# Patient Record
Sex: Female | Born: 1955 | Race: Black or African American | Hispanic: No | Smoking: Never smoker
Health system: Southern US, Community
[De-identification: ages and names within clinical notes are randomized; demographics above are authoritative.]

## PROBLEM LIST (undated history)

## (undated) DIAGNOSIS — I4819 Other persistent atrial fibrillation: Secondary | ICD-10-CM

## (undated) DIAGNOSIS — E119 Type 2 diabetes mellitus without complications: Secondary | ICD-10-CM

## (undated) DIAGNOSIS — J45909 Unspecified asthma, uncomplicated: Secondary | ICD-10-CM

## (undated) DIAGNOSIS — M1990 Unspecified osteoarthritis, unspecified site: Secondary | ICD-10-CM

## (undated) DIAGNOSIS — I639 Cerebral infarction, unspecified: Secondary | ICD-10-CM

## (undated) DIAGNOSIS — I10 Essential (primary) hypertension: Secondary | ICD-10-CM

## (undated) DIAGNOSIS — D649 Anemia, unspecified: Secondary | ICD-10-CM

## (undated) HISTORY — DX: Cerebral infarction, unspecified: I63.9

## (undated) HISTORY — DX: Essential (primary) hypertension: I10

## (undated) HISTORY — DX: Morbid (severe) obesity due to excess calories: E66.01

## (undated) HISTORY — PX: KNEE ARTHROSCOPY: SHX127

## (undated) HISTORY — DX: Anemia, unspecified: D64.9

## (undated) HISTORY — DX: Other persistent atrial fibrillation: I48.19

## (undated) HISTORY — DX: Type 2 diabetes mellitus without complications: E11.9

## (undated) HISTORY — DX: Unspecified asthma, uncomplicated: J45.909

## (undated) HISTORY — PX: TONSILLECTOMY: SUR1361

---

## 1989-10-13 HISTORY — PX: TUBAL LIGATION: SHX77

## 1998-07-03 ENCOUNTER — Encounter: Admission: RE | Admit: 1998-07-03 | Discharge: 1998-07-03 | Payer: Self-pay | Admitting: *Deleted

## 2001-01-27 ENCOUNTER — Encounter: Admission: RE | Admit: 2001-01-27 | Discharge: 2001-01-27 | Payer: Self-pay | Admitting: Family Medicine

## 2001-01-27 ENCOUNTER — Encounter: Payer: Self-pay | Admitting: Family Medicine

## 2001-07-31 ENCOUNTER — Emergency Department (HOSPITAL_COMMUNITY): Admission: EM | Admit: 2001-07-31 | Discharge: 2001-08-01 | Payer: Self-pay

## 2002-06-01 ENCOUNTER — Encounter: Payer: Self-pay | Admitting: Emergency Medicine

## 2002-06-01 ENCOUNTER — Emergency Department (HOSPITAL_COMMUNITY): Admission: EM | Admit: 2002-06-01 | Discharge: 2002-06-02 | Payer: Self-pay | Admitting: Emergency Medicine

## 2002-06-08 ENCOUNTER — Ambulatory Visit: Admission: RE | Admit: 2002-06-08 | Discharge: 2002-06-08 | Payer: Self-pay | Admitting: Internal Medicine

## 2003-02-23 ENCOUNTER — Emergency Department (HOSPITAL_COMMUNITY): Admission: EM | Admit: 2003-02-23 | Discharge: 2003-02-23 | Payer: Self-pay | Admitting: Emergency Medicine

## 2003-02-23 ENCOUNTER — Encounter: Payer: Self-pay | Admitting: Emergency Medicine

## 2003-04-04 ENCOUNTER — Emergency Department (HOSPITAL_COMMUNITY): Admission: EM | Admit: 2003-04-04 | Discharge: 2003-04-04 | Payer: Self-pay | Admitting: Emergency Medicine

## 2004-02-28 ENCOUNTER — Ambulatory Visit (HOSPITAL_COMMUNITY): Admission: RE | Admit: 2004-02-28 | Discharge: 2004-02-28 | Payer: Self-pay | Admitting: Otolaryngology

## 2004-02-28 ENCOUNTER — Ambulatory Visit (HOSPITAL_BASED_OUTPATIENT_CLINIC_OR_DEPARTMENT_OTHER): Admission: RE | Admit: 2004-02-28 | Discharge: 2004-02-28 | Payer: Self-pay | Admitting: Otolaryngology

## 2015-12-12 ENCOUNTER — Ambulatory Visit: Payer: BLUE CROSS/BLUE SHIELD | Admitting: Internal Medicine

## 2016-10-13 DIAGNOSIS — I639 Cerebral infarction, unspecified: Secondary | ICD-10-CM

## 2016-10-13 HISTORY — DX: Cerebral infarction, unspecified: I63.9

## 2017-01-06 ENCOUNTER — Encounter (HOSPITAL_COMMUNITY): Payer: Self-pay | Admitting: Emergency Medicine

## 2017-01-06 ENCOUNTER — Emergency Department (HOSPITAL_COMMUNITY): Payer: BLUE CROSS/BLUE SHIELD

## 2017-01-06 ENCOUNTER — Other Ambulatory Visit: Payer: Self-pay

## 2017-01-06 ENCOUNTER — Emergency Department (HOSPITAL_COMMUNITY)
Admission: EM | Admit: 2017-01-06 | Discharge: 2017-01-07 | Disposition: A | Discharge status: Home / Self Care | Payer: BLUE CROSS/BLUE SHIELD | Attending: Emergency Medicine | Admitting: Emergency Medicine

## 2017-01-06 DIAGNOSIS — R55 Syncope and collapse: Secondary | ICD-10-CM | POA: Diagnosis present

## 2017-01-06 DIAGNOSIS — J111 Influenza due to unidentified influenza virus with other respiratory manifestations: Secondary | ICD-10-CM | POA: Insufficient documentation

## 2017-01-06 DIAGNOSIS — Z79899 Other long term (current) drug therapy: Secondary | ICD-10-CM | POA: Insufficient documentation

## 2017-01-06 HISTORY — DX: Unspecified osteoarthritis, unspecified site: M19.90

## 2017-01-06 LAB — BASIC METABOLIC PANEL
Anion gap: 11 (ref 5–15)
BUN: 13 mg/dL (ref 6–20)
CALCIUM: 8.6 mg/dL — AB (ref 8.9–10.3)
CO2: 19 mmol/L — AB (ref 22–32)
Chloride: 104 mmol/L (ref 101–111)
Creatinine, Ser: 1.05 mg/dL — ABNORMAL HIGH (ref 0.44–1.00)
GFR calc Af Amer: >60 mL/min (ref >60)
GFR, EST NON AFRICAN AMERICAN: 57 mL/min — AB (ref >60)
GLUCOSE: 109 mg/dL — AB (ref 65–99)
POTASSIUM: 4.7 mmol/L (ref 3.5–5.1)
Sodium: 134 mmol/L — ABNORMAL LOW (ref 135–145)

## 2017-01-06 LAB — URINALYSIS, ROUTINE W REFLEX MICROSCOPIC
BILIRUBIN URINE: NEGATIVE
Glucose, UA: NEGATIVE mg/dL
HGB URINE DIPSTICK: NEGATIVE
KETONES UR: NEGATIVE mg/dL
Leukocytes, UA: NEGATIVE
Nitrite: NEGATIVE
PROTEIN: NEGATIVE mg/dL
Specific Gravity, Urine: 1.017 (ref 1.005–1.030)
pH: 5 (ref 5.0–8.0)

## 2017-01-06 LAB — CBC
HEMATOCRIT: 36 % (ref 36.0–46.0)
Hemoglobin: 11.2 g/dL — ABNORMAL LOW (ref 12.0–15.0)
MCH: 26.9 pg (ref 26.0–34.0)
MCHC: 31.1 g/dL (ref 30.0–36.0)
MCV: 86.3 fL (ref 78.0–100.0)
PLATELETS: 241 10*3/uL (ref 150–400)
RBC: 4.17 MIL/uL (ref 3.87–5.11)
RDW: 14.6 % (ref 11.5–15.5)
WBC: 6.4 10*3/uL (ref 4.0–10.5)

## 2017-01-06 LAB — CBG MONITORING, ED: Glucose-Capillary: 94 mg/dL (ref 65–99)

## 2017-01-06 NOTE — ED Provider Notes (Signed)
[facility] DEPT Provider Note   CSN: [id number] Arrival date & time: 01/06/17  6734     History   Chief Complaint Chief Complaint  Patient presents with  . Loss of Consciousness    HPI Stacy Chapman is a 61 y.o. female.  She presents for evaluation of syncope.  This occurred as she got up from the commode, to wash her hands.  Fell injuring her right hand.  Family members heard her fall and came to her assistance.  She has been ill for 3 days with fever, cough, and malaise.  She denies headache, neck pain, back pain, difficulty walking or weakness at this time.  Her daughter is sick with identical respiratory illness.  Patient denies dysuria, diarrhea, nausea or vomiting.  There are no other known modifying factors.  HPI  Past Medical History:  Diagnosis Date  . Arthritis     There are no active problems to display for this patient.   Past Surgical History:  Procedure Laterality Date  . KNEE ARTHROSCOPY      OB History    No data available       Home Medications    Prior to Admission medications   Medication Sig Start Date End Date Taking? Authorizing Provider  cetirizine (ZYRTEC) 10 MG tablet Take 10 mg by mouth daily as needed for allergies.   Yes Historical Provider, MD    Family History No family history on file.  Social History Social History  Substance Use Topics  . Smoking status: Never Smoker  . Smokeless tobacco: Never Used  . Alcohol use 3.0 oz/week    5 Standard drinks or equivalent per week     Comment: Drinks multiple times a week     Allergies   Patient has no known allergies.   Review of Systems Review of Systems  All other systems reviewed and are negative.    Physical Exam Updated Vital Signs BP 133/73 (BP Location: Left Arm)   Pulse 90   Temp (!) 100.7 F (38.2 C) (Oral)   Resp (!) 22   Ht 5\' 8"  (1.727 m)   Wt 230 lb (104.3 kg)   SpO2 100%   BMI 34.97 kg/m   Physical Exam  Constitutional: She is oriented  to person, place, and time. She appears well-developed. No distress.  Obese  HENT:  Head: Normocephalic and atraumatic.  Eyes: Conjunctivae and EOM are normal. Pupils are equal, round, and reactive to light.  Neck: Normal range of motion and phonation normal. Neck supple.  Cardiovascular: Normal rate and regular rhythm.   Pulmonary/Chest: Effort normal and breath sounds normal. No respiratory distress. She has no wheezes. She exhibits no tenderness.  Abdominal: Soft. She exhibits no distension. There is no tenderness. There is no guarding.  Musculoskeletal: Normal range of motion.  Neurological: She is alert and oriented to person, place, and time. She exhibits normal muscle tone.  Skin: Skin is warm and dry.  Psychiatric: She has a normal mood and affect. Her behavior is normal. Judgment and thought content normal.  Nursing note and vitals reviewed.    ED Treatments / Results  Labs (all labs ordered are listed, but only abnormal results are displayed) Labs Reviewed  BASIC METABOLIC PANEL - Abnormal; Notable for the following:       Result Value   Sodium 134 (*)    CO2 19 (*)    Glucose, Bld 109 (*)    Creatinine, Ser 1.05 (*)    Calcium 8.6 (*)  GFR calc non Af Amer 57 (*)    All other components within normal limits  CBC - Abnormal; Notable for the following:    Hemoglobin 11.2 (*)    All other components within normal limits  URINALYSIS, ROUTINE W REFLEX MICROSCOPIC - Abnormal; Notable for the following:    APPearance HAZY (*)    All other components within normal limits  CBG MONITORING, ED    EKG  EKG Interpretation  Date/Time:  Tuesday January 06 2017 18:29:16 EDT Ventricular Rate:  83 PR Interval:  130 QRS Duration: 70 QT Interval:  360 QTC Calculation: 423 R Axis:   16 Text Interpretation:  Normal sinus rhythm Nonspecific ST and T wave abnormality Abnormal ECG since last tracing no significant change Confirmed by Eulis Foster  MD, Louana Fontenot [phone]) on 01/06/2017 10:49:55  PM       Radiology Dg Chest 2 View  Result Date: 01/06/2017 CLINICAL DATA:  Productive cough and fever x3 days EXAM: CHEST  2 VIEW COMPARISON:  Chest CT report from 06/01/2002 and CXR from 06/08/2002 FINDINGS: The heart size and mediastinal contours are within normal limits. No pulmonary consolidation, CHF or effusion. Left upper lobe 8 mm mm density projects over the left posterior fifth rib possibly a bone island or pulmonary nodule. Small anterior basilar nodular densities also seen on the lateral view measure approximately 8 mm as well. Patient reportedly had pulmonary nodules on the prior chest CT 2003. Unfortunately these are not available for direct comparison. A non emergent repeat chest CT may help to establish new baseline or stability if prior chest CT images are made available. IMPRESSION: No active cardiopulmonary disease. Left upper and anterior basilar nodular densities measuring up to 8 mm. Consider repeat nonemergent chest CT as clinically warranted. Patient had nodular densities noted on a prior chest CT from 2003 but these are not available for direct comparison. Electronically Signed   By: Ashley Royalty M.D.   On: 01/06/2017 21:01    Procedures Procedures (including critical care time)  Medications Ordered in ED Medications - No data to display   Initial Impression / Assessment and Plan / ED Course  I have reviewed the triage vital signs and the nursing notes.  Pertinent labs & imaging results that were available during my care of the patient were reviewed by me and considered in my medical decision making (see chart for details).     Medications - No data to display  Patient Vitals for the past 24 hrs:  BP Temp Temp src Pulse Resp SpO2 Height Weight  01/06/17 2349 133/73 (!) 100.7 F (38.2 C) Oral 90 (!) 22 100 % - -  01/06/17 2215 140/78 - - 88 - 99 % - -  01/06/17 2200 (!) 153/65 - - (!) 49 - 99 % - -  01/06/17 2030 128/69 - - 88 (!) 23 100 % - -  01/06/17 2015  140/77 - - 85 (!) 21 99 % - -  01/06/17 2000 - (!) 101.1 F (38.4 C) Oral - - - - -  01/06/17 1950 140/79 - - 81 - 99 % - -  01/06/17 1931 - - - - - - 5\' 8"  (1.727 m) 230 lb (104.3 kg)  01/06/17 1930 128/65 99.8 F (37.7 C) Oral 81 18 98 % - -    12:04 AM Reevaluation with update and discussion. After initial assessment and treatment, an updated evaluation reveals at this time she is feeling better and has no further complaints.  Findings discussed  with patient and friend with her, all questions answered. Jacion Dismore L    Final Clinical Impressions(s) / ED Diagnoses   Final diagnoses:  Influenza  Syncope, unspecified syncope type   Syncope, with febrile illness.  Doubt serious injury.  Likely influenza without evidence for pneumonia, metabolic instability or impending vascular collapse.  Nursing Notes Reviewed/ Care Coordinated Applicable Imaging Reviewed Interpretation of Laboratory Data incorporated into ED treatment  The patient appears reasonably screened and/or stabilized for discharge and I doubt any other medical condition or other [facility] requiring further screening, evaluation, or treatment in the ED at this time prior to discharge.  Plan: Home Medications-OTC analgesia, antipyretic.; Home Treatments-rest fluids; return here if the recommended treatment, does not improve the symptoms; Recommended follow up-PCP as needed    New Prescriptions New Prescriptions   No medications on file     Daleen Bo, MD 01/07/17 0006

## 2017-01-06 NOTE — ED Notes (Signed)
On way to XR 

## 2017-01-06 NOTE — ED Triage Notes (Signed)
Pt arrives to the ED via EMS with reports of syncopal episode. Pt reports fever and diarrhea X3 days. Pt was using the bathroom and passed out. Pt did hit head on floor or toilette. Pt denies taking blood thinners. Per EMS pt had orthostatic changes. Pt given 250cc NS en route.

## 2017-01-06 NOTE — ED Notes (Signed)
ED EKG done by [facility], EMT and given to Dr. Eulis Foster.  Patient was in hallway when done.

## 2017-01-06 NOTE — ED Notes (Signed)
This RN attempted unsuccessfully to draw labs. Primary RN made aware.

## 2017-01-06 NOTE — ED Notes (Signed)
Santiago Glad from lab is going to attempt blood draw.  2 RNs with unsuccessful attempts.

## 2017-01-06 NOTE — ED Notes (Signed)
Pt states that she has been having fever and chills since Sunday.  States she has had no pain. Diarrhea started today and states that she has gone at least 10 times.  Denies any N/V.  Patient states that she has had a cough for months.  Patient states she was washing hands in bathroom and got really hot. Pt states the heat made her pass out.  Patient hit head either on wall or bathtub.  LOC no more than thirty seconds.  Patient has knot on left side of forehead.

## 2017-01-07 NOTE — Discharge Instructions (Signed)
Get plenty of rest and drink a lot of fluids.  There were no serious problems found, after your episode of fainting.  For fever or pain, take Tylenol or Motrin.  For cough, use Robitussin-DM.  Follow-up with the doctor of your choice as needed, for problems.

## 2017-01-07 NOTE — ED Notes (Signed)
Pt stable, understands discharge instructions, and reasons for return.   

## 2017-01-14 ENCOUNTER — Ambulatory Visit: Payer: BLUE CROSS/BLUE SHIELD | Admitting: Nurse Practitioner

## 2017-01-15 ENCOUNTER — Ambulatory Visit (INDEPENDENT_AMBULATORY_CARE_PROVIDER_SITE_OTHER): Payer: BLUE CROSS/BLUE SHIELD | Admitting: Nurse Practitioner

## 2017-01-15 ENCOUNTER — Encounter: Payer: Self-pay | Admitting: Nurse Practitioner

## 2017-01-15 ENCOUNTER — Other Ambulatory Visit (INDEPENDENT_AMBULATORY_CARE_PROVIDER_SITE_OTHER): Payer: BLUE CROSS/BLUE SHIELD

## 2017-01-15 ENCOUNTER — Ambulatory Visit (INDEPENDENT_AMBULATORY_CARE_PROVIDER_SITE_OTHER)
Admission: RE | Admit: 2017-01-15 | Discharge: 2017-01-15 | Disposition: A | Discharge status: Home / Self Care | Payer: BLUE CROSS/BLUE SHIELD | Source: Ambulatory Visit | Attending: Nurse Practitioner | Admitting: Nurse Practitioner

## 2017-01-15 VITALS — BP 150/80 | HR 84 | Temp 98.3°F | Ht 68.0 in | Wt 236.0 lb

## 2017-01-15 DIAGNOSIS — S0990XA Unspecified injury of head, initial encounter: Secondary | ICD-10-CM

## 2017-01-15 DIAGNOSIS — J9801 Acute bronchospasm: Secondary | ICD-10-CM | POA: Diagnosis not present

## 2017-01-15 DIAGNOSIS — R0602 Shortness of breath: Secondary | ICD-10-CM | POA: Diagnosis not present

## 2017-01-15 DIAGNOSIS — J209 Acute bronchitis, unspecified: Secondary | ICD-10-CM | POA: Diagnosis not present

## 2017-01-15 DIAGNOSIS — R42 Dizziness and giddiness: Secondary | ICD-10-CM

## 2017-01-15 DIAGNOSIS — M542 Cervicalgia: Secondary | ICD-10-CM | POA: Diagnosis not present

## 2017-01-15 DIAGNOSIS — W19XXXA Unspecified fall, initial encounter: Secondary | ICD-10-CM | POA: Diagnosis not present

## 2017-01-15 DIAGNOSIS — S161XXA Strain of muscle, fascia and tendon at neck level, initial encounter: Secondary | ICD-10-CM

## 2017-01-15 DIAGNOSIS — E860 Dehydration: Secondary | ICD-10-CM

## 2017-01-15 LAB — CBC WITH DIFFERENTIAL/PLATELET
BASOS ABS: 0 10*3/uL (ref 0.0–0.1)
BASOS PCT: 0.3 % (ref 0.0–3.0)
EOS ABS: 0.4 10*3/uL (ref 0.0–0.7)
Eosinophils Relative: 6.5 % — ABNORMAL HIGH (ref 0.0–5.0)
HCT: 33.5 % — ABNORMAL LOW (ref 36.0–46.0)
HEMOGLOBIN: 10.8 g/dL — AB (ref 12.0–15.0)
LYMPHS PCT: 26.1 % (ref 12.0–46.0)
Lymphs Abs: 1.7 10*3/uL (ref 0.7–4.0)
MCHC: 32.3 g/dL (ref 30.0–36.0)
MCV: 84.9 fl (ref 78.0–100.0)
MONO ABS: 0.7 10*3/uL (ref 0.1–1.0)
Monocytes Relative: 10.9 % (ref 3.0–12.0)
Neutro Abs: 3.6 10*3/uL (ref 1.4–7.7)
Neutrophils Relative %: 56.2 % (ref 43.0–77.0)
Platelets: 324 10*3/uL (ref 150.0–400.0)
RBC: 3.95 Mil/uL (ref 3.87–5.11)
RDW: 14.5 % (ref 11.5–15.5)
WBC: 6.5 10*3/uL (ref 4.0–10.5)

## 2017-01-15 LAB — COMPREHENSIVE METABOLIC PANEL
ALBUMIN: 3.6 g/dL (ref 3.5–5.2)
ALT: 37 U/L — AB (ref 0–35)
AST: 44 U/L — AB (ref 0–37)
Alkaline Phosphatase: 56 U/L (ref 39–117)
BILIRUBIN TOTAL: 0.4 mg/dL (ref 0.2–1.2)
BUN: 16 mg/dL (ref 6–23)
CALCIUM: 8.7 mg/dL (ref 8.4–10.5)
CHLORIDE: 107 meq/L (ref 96–112)
CO2: 27 mEq/L (ref 19–32)
CREATININE: 0.85 mg/dL (ref 0.40–1.20)
GFR: 87.64 mL/min (ref >60.00)
Glucose, Bld: 99 mg/dL (ref 70–99)
Potassium: 4.2 mEq/L (ref 3.5–5.1)
SODIUM: 139 meq/L (ref 135–145)
Total Protein: 7.7 g/dL (ref 6.0–8.3)

## 2017-01-15 MED ORDER — ACETAMINOPHEN 500 MG PO TABS: 500.0000 mg | ORAL_TABLET | Freq: Four times a day (QID) | ORAL | 0 refills | Status: DC | PRN

## 2017-01-15 MED ORDER — ALBUTEROL SULFATE HFA 108 (90 BASE) MCG/ACT IN AERS: 2.0000 | INHALATION_SPRAY | Freq: Four times a day (QID) | RESPIRATORY_TRACT | 2 refills | Status: DC | PRN

## 2017-01-15 MED ORDER — BENZONATATE 100 MG PO CAPS: 100.0000 mg | ORAL_CAPSULE | Freq: Three times a day (TID) | ORAL | 0 refills | Status: DC | PRN

## 2017-01-15 MED ORDER — GUAIFENESIN ER 600 MG PO TB12: 600.0000 mg | ORAL_TABLET | Freq: Two times a day (BID) | ORAL | 0 refills | Status: DC | PRN

## 2017-01-15 NOTE — Progress Notes (Signed)
Pre visit review using our clinic review tool, if applicable. No additional management support is needed unless otherwise documented below in the visit note. 

## 2017-01-15 NOTE — Patient Instructions (Signed)
Go to basement for blood draw and CXR.  You will be called with results.   Dehydration, Adult Dehydration is a condition in which there is not enough fluid or water in the body. This happens when you lose more fluids than you take in. Important organs, such as the kidneys, brain, and heart, cannot function without a proper amount of fluids. Any loss of fluids from the body can lead to dehydration. Dehydration can range from mild to severe. This condition should be treated right away to prevent it from becoming severe. What are the causes? This condition may be caused by:  Vomiting.  Diarrhea.  Excessive sweating, such as from heat exposure or exercise.  Not drinking enough fluid, especially:  When ill.  While doing activity that requires a lot of energy.  Excessive urination.  Fever.  Infection.  Certain medicines, such as medicines that cause the body to lose excess fluid (diuretics).  Inability to access safe drinking water.  Reduced physical ability to get adequate water and food. What increases the risk? This condition is more likely to develop in people:  Who have a poorly controlled long-term (chronic) illness, such as diabetes, heart disease, or kidney disease.  Who are age 29 or older.  Who are disabled.  Who live in a place with high altitude.  Who play endurance sports. What are the signs or symptoms? Symptoms of mild dehydration may include:   Thirst.  Dry lips.  Slightly dry mouth.  Dry, warm skin.  Dizziness. Symptoms of moderate dehydration may include:   Very dry mouth.  Muscle cramps.  Dark urine. Urine may be the color of tea.  Decreased urine production.  Decreased tear production.  Heartbeat that is irregular or faster than normal (palpitations).  Headache.  Light-headedness, especially when you stand up from a sitting position.  Fainting (syncope). Symptoms of severe dehydration may include:   Changes in skin, such  as:  Cold and clammy skin.  Blotchy (mottled) or pale skin.  Skin that does not quickly return to normal after being lightly pinched and released (poor skin turgor).  Changes in body fluids, such as:  Extreme thirst.  No tear production.  Inability to sweat when body temperature is high, such as in hot weather.  Very little urine production.  Changes in vital signs, such as:  Weak pulse.  Pulse that is more than 100 beats a minute when sitting still.  Rapid breathing.  Low blood pressure.  Other changes, such as:  Sunken eyes.  Cold hands and feet.  Confusion.  Lack of energy (lethargy).  Difficulty waking up from sleep.  Short-term weight loss.  Unconsciousness. How is this diagnosed? This condition is diagnosed based on your symptoms and a physical exam. Blood and urine tests may be done to help confirm the diagnosis. How is this treated? Treatment for this condition depends on the severity. Mild or moderate dehydration can often be treated at home. Treatment should be started right away. Do not wait until dehydration becomes severe. Severe dehydration is an emergency and it needs to be treated in a hospital. Treatment for mild dehydration may include:   Drinking more fluids.  Replacing salts and minerals in your blood (electrolytes) that you may have lost. Treatment for moderate dehydration may include:   Drinking an oral rehydration solution (ORS). This is a drink that helps you replace fluids and electrolytes (rehydrate). It can be found at pharmacies and retail stores. Treatment for severe dehydration may include:  Receiving fluids through an IV tube.  Receiving an electrolyte solution through a feeding tube that is passed through your nose and into your stomach (nasogastric tube, or NG tube).  Correcting any abnormalities in electrolytes.  Treating the underlying cause of dehydration. Follow these instructions at home:  If directed by your  health care provider, drink an ORS:  Make an ORS by following instructions on the package.  Start by drinking small amounts, about  cup (120 mL) every 5-10 minutes.  Slowly increase how much you drink until you have taken the amount recommended by your health care provider.  Drink enough clear fluid to keep your urine clear or pale yellow. If you were told to drink an ORS, finish the ORS first, then start slowly drinking other clear fluids. Drink fluids such as:  Water. Do not drink only water. Doing that can lead to having too little salt (sodium) in the body (hyponatremia).  Ice chips.  Fruit juice that you have added water to (diluted fruit juice).  Low-calorie sports drinks.  Avoid:  Alcohol.  Drinks that contain a lot of sugar. These include high-calorie sports drinks, fruit juice that is not diluted, and soda.  Caffeine.  Foods that are greasy or contain a lot of fat or sugar.  Take over-the-counter and prescription medicines only as told by your health care provider.  Do not take sodium tablets. This can lead to having too much sodium in the body (hypernatremia).  Eat foods that contain a healthy balance of electrolytes, such as bananas, oranges, potatoes, tomatoes, and spinach.  Keep all follow-up visits as told by your health care provider. This is important. Contact a health care provider if:  You have abdominal pain that:  Gets worse.  Stays in one area (localizes).  You have a rash.  You have a stiff neck.  You are more irritable than usual.  You are sleepier or more difficult to wake up than usual.  You feel weak or dizzy.  You feel very thirsty.  You have urinated only a small amount of very dark urine over 6-8 hours. Get help right away if:  You have symptoms of severe dehydration.  You cannot drink fluids without vomiting.  Your symptoms get worse with treatment.  You have a fever.  You have a severe headache.  You have vomiting or  diarrhea that:  Gets worse.  Does not go away.  You have blood or green matter (bile) in your vomit.  You have blood in your stool. This may cause stool to look black and tarry.  You have not urinated in 6-8 hours.  You faint.  Your heart rate while sitting still is over 100 beats a minute.  You have trouble breathing. This information is not intended to replace advice given to you by your health care provider. Make sure you discuss any questions you have with your health care provider. Document Released: 09/29/2005 Document Revised: 04/25/2016 Document Reviewed: 11/23/2015 Elsevier Interactive Patient Education  2017 [employer].

## 2017-01-15 NOTE — Progress Notes (Signed)
Subjective:  Patient ID: Stacy Chapman, female    DOB: [birth year]  Age: 61 y.o. MRN: [id number]  CC: Establish Care (est care/pt fell last---went to ER--body pain,dizzy,)   Headache   This is a new problem. The current episode started 1 to 4 weeks ago. The problem occurs intermittently. The problem has been waxing and waning. The pain is located in the bilateral and frontal region. The pain does not radiate. The pain quality is not similar to prior headaches. The quality of the pain is described as aching and throbbing. The pain is moderate. Associated symptoms include back pain, coughing, dizziness and neck pain. Pertinent negatives include no abnormal behavior, anorexia, blurred vision, ear pain, fever, numbness, rhinorrhea, sinus pressure, sore throat, visual change or vomiting.  Shortness of Breath  This is a new problem. The current episode started 1 to 4 weeks ago. The problem occurs constantly. The problem has been gradually worsening. Associated symptoms include headaches, neck pain, sputum production, syncope and wheezing. Pertinent negatives include no chest pain, ear pain, fever, hemoptysis, leg pain, leg swelling, orthopnea, PND, rash, rhinorrhea, sore throat or vomiting. The symptoms are aggravated by any activity and URIs.    Outpatient Medications Prior to Visit  Medication Sig Dispense Refill  . cetirizine (ZYRTEC) 10 MG tablet Take 10 mg by mouth daily as needed for allergies.     No facility-administered medications prior to visit.     ROS See HPI  Objective:  BP (!) 150/80   Pulse 84   Temp 98.3 F (36.8 C)   Ht 5\' 8"  (1.727 m)   Wt 236 lb (107 kg)   SpO2 99%   BMI 35.88 kg/m   BP Readings from Last 3 Encounters:  01/15/17 (!) 150/80  01/06/17 133/73    Wt Readings from Last 3 Encounters:  01/15/17 236 lb (107 kg)  01/06/17 230 lb (104.3 kg)    Physical Exam  Constitutional: She is oriented to person, place, and time.  HENT:  Right Ear:  External ear normal.  Left Ear: External ear normal.  Nose: Nose normal.  Mouth/Throat: Oropharynx is clear and moist. No oropharyngeal exudate.  Eyes: Conjunctivae and EOM are normal. Pupils are equal, round, and reactive to light.  Neck: Normal range of motion. Neck supple. No thyromegaly present.  Cardiovascular: Normal rate, regular rhythm and normal heart sounds.   Pulmonary/Chest: Effort normal. She has wheezes. She has no rales. She exhibits no tenderness.  Abdominal: Soft. Bowel sounds are normal.  Musculoskeletal: She exhibits no edema.  Lymphadenopathy:    She has no cervical adenopathy.  Neurological: She is alert and oriented to person, place, and time.  Skin: Skin is warm and dry.  Vitals reviewed.   Lab Results  Component Value Date   WBC 6.5 01/15/2017   HGB 10.8 (L) 01/15/2017   HCT 33.5 (L) 01/15/2017   PLT 324.0 01/15/2017   GLUCOSE 99 01/15/2017   ALT 37 (H) 01/15/2017   AST 44 (H) 01/15/2017   NA 139 01/15/2017   K 4.2 01/15/2017   CL 107 01/15/2017   CREATININE 0.85 01/15/2017   BUN 16 01/15/2017   CO2 27 01/15/2017    Dg Chest 2 View  Result Date: 01/06/2017 CLINICAL DATA:  Productive cough and fever x3 days EXAM: CHEST  2 VIEW COMPARISON:  Chest CT report from 06/01/2002 and CXR from 06/08/2002 FINDINGS: The heart size and mediastinal contours are within normal limits. No pulmonary consolidation, CHF or effusion. Left upper lobe  8 mm mm density projects over the left posterior fifth rib possibly a bone island or pulmonary nodule. Small anterior basilar nodular densities also seen on the lateral view measure approximately 8 mm as well. Patient reportedly had pulmonary nodules on the prior chest CT 2003. Unfortunately these are not available for direct comparison. A non emergent repeat chest CT may help to establish new baseline or stability if prior chest CT images are made available. IMPRESSION: No active cardiopulmonary disease. Left upper and anterior  basilar nodular densities measuring up to 8 mm. Consider repeat nonemergent chest CT as clinically warranted. Patient had nodular densities noted on a prior chest CT from 2003 but these are not available for direct comparison. Electronically Signed   By: Ashley Royalty M.D.   On: 01/06/2017 21:01    Assessment & Plan:   Stacy Chapman was seen today for establish care.  Diagnoses and all orders for this visit:  Injury of head, initial encounter -     CT Head Wo Contrast; Future -     DG Cervical Spine Complete; Future -     acetaminophen (TYLENOL) 500 MG tablet; Take 1 tablet (500 mg total) by mouth every 6 (six) hours as needed.  Fall, initial encounter -     CT Head Wo Contrast; Future -     acetaminophen (TYLENOL) 500 MG tablet; Take 1 tablet (500 mg total) by mouth every 6 (six) hours as needed.  Dehydration -     Comprehensive metabolic panel; Future -     CBC w/Diff; Future  SOB (shortness of breath) on exertion -     DG Chest 2 View; Future -     Comprehensive metabolic panel; Future -     CBC w/Diff; Future -     albuterol (PROVENTIL HFA;VENTOLIN HFA) 108 (90 Base) MCG/ACT inhaler; Inhale 2 puffs into the lungs every 6 (six) hours as needed for wheezing or shortness of breath. -     guaiFENesin (MUCINEX) 600 MG 12 hr tablet; Take 1 tablet (600 mg total) by mouth 2 (two) times daily as needed for cough or to loosen phlegm. -     benzonatate (TESSALON) 100 MG capsule; Take 1 capsule (100 mg total) by mouth 3 (three) times daily as needed for cough. -     methylPREDNISolone (MEDROL DOSEPAK) 4 MG TBPK tablet; Take as directed on package  Acute bronchitis, unspecified organism -     DG Chest 2 View; Future -     Comprehensive metabolic panel; Future -     CBC w/Diff; Future -     albuterol (PROVENTIL HFA;VENTOLIN HFA) 108 (90 Base) MCG/ACT inhaler; Inhale 2 puffs into the lungs every 6 (six) hours as needed for wheezing or shortness of breath. -     guaiFENesin (MUCINEX) 600 MG 12 hr  tablet; Take 1 tablet (600 mg total) by mouth 2 (two) times daily as needed for cough or to loosen phlegm. -     benzonatate (TESSALON) 100 MG capsule; Take 1 capsule (100 mg total) by mouth 3 (three) times daily as needed for cough. -     methylPREDNISolone (MEDROL DOSEPAK) 4 MG TBPK tablet; Take as directed on package  Dizziness -     Comprehensive metabolic panel; Future -     CBC w/Diff; Future -     DG Cervical Spine Complete; Future  Cervical muscle strain, initial encounter -     DG Cervical Spine Complete; Future -     acetaminophen (TYLENOL) 500  MG tablet; Take 1 tablet (500 mg total) by mouth every 6 (six) hours as needed. -     tiZANidine (ZANAFLEX) 4 MG capsule; Take 1 capsule (4 mg total) by mouth 3 (three) times daily as needed for muscle spasms.   I have discontinued Ms. Laski's cetirizine, ibuprofen, and Pseudoeph-Bromphen-DM (ROBITUSSIN ALLERGY/COUGH PO). I am also having her start on albuterol, guaiFENesin, acetaminophen, benzonatate, methylPREDNISolone, and tiZANidine.  Meds ordered this encounter  Medications  . DISCONTD: ibuprofen (ADVIL,MOTRIN) 100 MG/5ML suspension    Sig: Take 200 mg by mouth every 4 (four) hours as needed.  Marland Kitchen DISCONTD: Pseudoeph-Bromphen-DM (ROBITUSSIN ALLERGY/COUGH PO)    Sig: Take by mouth.  Marland Kitchen albuterol (PROVENTIL HFA;VENTOLIN HFA) 108 (90 Base) MCG/ACT inhaler    Sig: Inhale 2 puffs into the lungs every 6 (six) hours as needed for wheezing or shortness of breath.    Dispense:  1 Inhaler    Refill:  2    Order Specific Question:   Supervising Provider    Answer:   Cassandria Anger [[id number]]  . guaiFENesin (MUCINEX) 600 MG 12 hr tablet    Sig: Take 1 tablet (600 mg total) by mouth 2 (two) times daily as needed for cough or to loosen phlegm.    Dispense:  14 tablet    Refill:  0    Order Specific Question:   Supervising Provider    Answer:   Cassandria Anger [[id number]]  . acetaminophen (TYLENOL) 500 MG tablet    Sig: Take 1 tablet (500  mg total) by mouth every 6 (six) hours as needed.    Dispense:  30 tablet    Refill:  0    Order Specific Question:   Supervising Provider    Answer:   Cassandria Anger [[id number]]  . benzonatate (TESSALON) 100 MG capsule    Sig: Take 1 capsule (100 mg total) by mouth 3 (three) times daily as needed for cough.    Dispense:  20 capsule    Refill:  0    Order Specific Question:   Supervising Provider    Answer:   Cassandria Anger [[id number]]  . methylPREDNISolone (MEDROL DOSEPAK) 4 MG TBPK tablet    Sig: Take as directed on package    Dispense:  21 tablet    Refill:  0    Order Specific Question:   Supervising Provider    Answer:   Cassandria Anger [[id number]]  . tiZANidine (ZANAFLEX) 4 MG capsule    Sig: Take 1 capsule (4 mg total) by mouth 3 (three) times daily as needed for muscle spasms.    Dispense:  20 capsule    Refill:  0    Order Specific Question:   Supervising Provider    Answer:   Cassandria Anger [[id number]]    Follow-up: Return in about 1 week (around 01/22/2017) for generalized pain, SOB.  Wilfred Lacy, NP

## 2017-01-16 MED ORDER — TIZANIDINE HCL 4 MG PO CAPS: 4.0000 mg | ORAL_CAPSULE | Freq: Three times a day (TID) | ORAL | 0 refills | Status: DC | PRN

## 2017-01-16 MED ORDER — METHYLPREDNISOLONE 4 MG PO TBPK: ORAL_TABLET | ORAL | 0 refills | Status: DC

## 2017-01-20 ENCOUNTER — Ambulatory Visit
Admission: RE | Admit: 2017-01-20 | Discharge: 2017-01-20 | Disposition: A | Discharge status: Home / Self Care | Payer: BLUE CROSS/BLUE SHIELD | Source: Ambulatory Visit | Attending: Nurse Practitioner | Admitting: Nurse Practitioner

## 2017-01-20 DIAGNOSIS — S0990XA Unspecified injury of head, initial encounter: Secondary | ICD-10-CM

## 2017-01-20 DIAGNOSIS — W19XXXA Unspecified fall, initial encounter: Secondary | ICD-10-CM

## 2017-01-22 ENCOUNTER — Ambulatory Visit (INDEPENDENT_AMBULATORY_CARE_PROVIDER_SITE_OTHER): Payer: BLUE CROSS/BLUE SHIELD | Admitting: Nurse Practitioner

## 2017-01-22 ENCOUNTER — Encounter: Payer: Self-pay | Admitting: Nurse Practitioner

## 2017-01-22 VITALS — BP 146/74 | HR 91 | Temp 98.1°F | Ht 68.0 in | Wt 240.0 lb

## 2017-01-22 DIAGNOSIS — J209 Acute bronchitis, unspecified: Secondary | ICD-10-CM

## 2017-01-22 DIAGNOSIS — R748 Abnormal levels of other serum enzymes: Secondary | ICD-10-CM | POA: Insufficient documentation

## 2017-01-22 DIAGNOSIS — S060X0A Concussion without loss of consciousness, initial encounter: Secondary | ICD-10-CM

## 2017-01-22 DIAGNOSIS — R739 Hyperglycemia, unspecified: Secondary | ICD-10-CM

## 2017-01-22 DIAGNOSIS — I4819 Other persistent atrial fibrillation: Secondary | ICD-10-CM | POA: Insufficient documentation

## 2017-01-22 DIAGNOSIS — R030 Elevated blood-pressure reading, without diagnosis of hypertension: Secondary | ICD-10-CM

## 2017-01-22 DIAGNOSIS — E119 Type 2 diabetes mellitus without complications: Secondary | ICD-10-CM | POA: Insufficient documentation

## 2017-01-22 DIAGNOSIS — Z13220 Encounter for screening for lipoid disorders: Secondary | ICD-10-CM

## 2017-01-22 DIAGNOSIS — I4891 Unspecified atrial fibrillation: Secondary | ICD-10-CM | POA: Diagnosis present

## 2017-01-22 DIAGNOSIS — J324 Chronic pansinusitis: Secondary | ICD-10-CM | POA: Insufficient documentation

## 2017-01-22 DIAGNOSIS — E1169 Type 2 diabetes mellitus with other specified complication: Secondary | ICD-10-CM | POA: Insufficient documentation

## 2017-01-22 DIAGNOSIS — J0140 Acute pansinusitis, unspecified: Secondary | ICD-10-CM

## 2017-01-22 DIAGNOSIS — Z136 Encounter for screening for cardiovascular disorders: Secondary | ICD-10-CM

## 2017-01-22 DIAGNOSIS — R930 Abnormal findings on diagnostic imaging of skull and head, not elsewhere classified: Secondary | ICD-10-CM | POA: Insufficient documentation

## 2017-01-22 DIAGNOSIS — I10 Essential (primary) hypertension: Secondary | ICD-10-CM | POA: Insufficient documentation

## 2017-01-22 MED ORDER — HYDROCHLOROTHIAZIDE 12.5 MG PO TABS: 12.5000 mg | ORAL_TABLET | Freq: Every day | ORAL | 1 refills | Status: DC

## 2017-01-22 MED ORDER — FLUTICASONE PROPIONATE 50 MCG/ACT NA SUSP
2.0000 | Freq: Every day | NASAL | 6 refills | Status: DC
Start: 2017-01-22 — End: 2017-03-05

## 2017-01-22 MED ORDER — CETIRIZINE HCL 10 MG PO TABS: 10.0000 mg | ORAL_TABLET | Freq: Every day | ORAL | 0 refills | Status: DC

## 2017-01-22 MED ORDER — DOXYCYCLINE HYCLATE 100 MG PO TABS: 100.0000 mg | ORAL_TABLET | Freq: Two times a day (BID) | ORAL | 0 refills | Status: AC

## 2017-01-22 MED ORDER — ASPIRIN EC 81 MG PO TBEC: 81.0000 mg | DELAYED_RELEASE_TABLET | Freq: Every day | ORAL | Status: DC

## 2017-01-22 NOTE — Progress Notes (Signed)
Pre visit review using our clinic review tool, if applicable. No additional management support is needed unless otherwise documented below in the visit note. 

## 2017-01-22 NOTE — Progress Notes (Signed)
Subjective:  Patient ID: Stacy Chapman, female    DOB: 11/07/1955  Age: 61 y.o. MRN: [id number]  CC: Follow-up (follow up/still has pain,SOB,headache--CT result?)   HPI  Headache and dizziness have improved. Able to ambulate without assist. Some dizziness with rapid head movement.   Abnormal head CT: She does not remember any CVA symptoms. FH of CVA (father and brother).  Outpatient Medications Prior to Visit  Medication Sig Dispense Refill  . acetaminophen (TYLENOL) 500 MG tablet Take 1 tablet (500 mg total) by mouth every 6 (six) hours as needed. 30 tablet 0  . albuterol (PROVENTIL HFA;VENTOLIN HFA) 108 (90 Base) MCG/ACT inhaler Inhale 2 puffs into the lungs every 6 (six) hours as needed for wheezing or shortness of breath. 1 Inhaler 2  . guaiFENesin (MUCINEX) 600 MG 12 hr tablet Take 1 tablet (600 mg total) by mouth 2 (two) times daily as needed for cough or to loosen phlegm. 14 tablet 0  . tiZANidine (ZANAFLEX) 4 MG capsule Take 1 capsule (4 mg total) by mouth 3 (three) times daily as needed for muscle spasms. 20 capsule 0  . methylPREDNISolone (MEDROL DOSEPAK) 4 MG TBPK tablet Take as directed on package 21 tablet 0  . benzonatate (TESSALON) 100 MG capsule Take 1 capsule (100 mg total) by mouth 3 (three) times daily as needed for cough. (Patient not taking: Reported on 01/22/2017) 20 capsule 0   No facility-administered medications prior to visit.     ROS See HPI  Objective:  BP (!) 146/74   Pulse 91   Temp 98.1 F (36.7 C)   Ht 5\' 8"  (1.727 m)   Wt 240 lb (108.9 kg)   SpO2 97%   BMI 36.49 kg/m   BP Readings from Last 3 Encounters:  01/22/17 (!) 146/74  01/15/17 (!) 150/80  01/06/17 133/73    Wt Readings from Last 3 Encounters:  01/22/17 240 lb (108.9 kg)  01/15/17 236 lb (107 kg)  01/06/17 230 lb (104.3 kg)    Physical Exam  Constitutional: She is oriented to person, place, and time. No distress.  HENT:  Right Ear: External ear normal.  Left  Ear: External ear normal.  Nose: Mucosal edema and rhinorrhea present.  Mouth/Throat: Posterior oropharyngeal erythema present. No oropharyngeal exudate.  Eyes: Conjunctivae and EOM are normal. Pupils are equal, round, and reactive to light. No scleral icterus.  Neck: Normal range of motion. Neck supple.  Cardiovascular: Normal rate, regular rhythm and normal heart sounds.   Pulmonary/Chest: Effort normal and breath sounds normal. No respiratory distress.  Abdominal: Soft. She exhibits no distension.  Musculoskeletal: Normal range of motion. She exhibits edema.  Mild bilateral ankle edema  Neurological: She is alert and oriented to person, place, and time.  Skin: Skin is warm and dry.  Psychiatric: She has a normal mood and affect. Her behavior is normal.    Lab Results  Component Value Date   WBC 6.5 01/15/2017   HGB 10.8 (L) 01/15/2017   HCT 33.5 (L) 01/15/2017   PLT 324.0 01/15/2017   GLUCOSE 99 01/15/2017   ALT 37 (H) 01/15/2017   AST 44 (H) 01/15/2017   NA 139 01/15/2017   K 4.2 01/15/2017   CL 107 01/15/2017   CREATININE 0.85 01/15/2017   BUN 16 01/15/2017   CO2 27 01/15/2017    Ct Head Wo Contrast  Result Date: 01/20/2017 CLINICAL DATA:  61 year old female with headaches and blurry vision since 01/06/2017 when she fell in her bathroom and hit  the left frontoparietal region on the bathtub EXAM: CT HEAD WITHOUT CONTRAST TECHNIQUE: Contiguous axial images were obtained from the base of the skull through the vertex without intravenous contrast. COMPARISON:  None. FINDINGS: Brain: No evidence of acute infarction, hemorrhage, hydrocephalus, extra-axial collection or mass lesion/mass effect. Mild periventricular, subcortical and deep white matter hypoattenuation which is nonspecific but most suggestive of chronic microvascular ischemic white matter disease. Vascular: No hyperdense vessel or unexpected calcification. Skull: Normal. Negative for acute fracture or focal lesion. Likely  remote left nasal bone fracture. Sinuses/Orbits: Moderate mucoperiosteal thickening throughout the ethmoid air cells and right maxillary sinus. Small air-fluid levels are present within the left maxillary sinus, sphenoid sinus and multiple ethmoid sinuses. The mastoid air cells are well aerated bilaterally. Other: None. IMPRESSION: 1. No acute intracranial abnormality. 2. 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 3. Moderate inflammatory paranasal sinus disease. Acute sinusitis is not excluded. 4. Likely remote left nasal bone fracture. Electronically Signed   By: Jacqulynn Cadet M.D.   On: 01/20/2017 15:18    Assessment & Plan:   Stacy Chapman was seen today for follow-up.  Diagnoses and all orders for this visit:  Concussion without loss of consciousness, initial encounter  Acute bronchitis, unspecified organism -     cetirizine (ZYRTEC) 10 MG tablet; Take 1 tablet (10 mg total) by mouth daily. -     fluticasone (FLONASE) 50 MCG/ACT nasal spray; Place 2 sprays into both nostrils daily.  Elevated BP without diagnosis of hypertension -     hydrochlorothiazide (HYDRODIURIL) 12.5 MG tablet; Take 1 tablet (12.5 mg total) by mouth daily. -     aspirin EC 81 MG tablet; Take 1 tablet (81 mg total) by mouth daily. -     TSH; Future  Encounter for lipid screening for cardiovascular disease -     Lipid panel; Future  Elevated liver enzymes -     Hepatitis C Antibody; Future -     Hepatitis B surface antigen; Future  Acute non-recurrent pansinusitis -     cetirizine (ZYRTEC) 10 MG tablet; Take 1 tablet (10 mg total) by mouth daily. -     fluticasone (FLONASE) 50 MCG/ACT nasal spray; Place 2 sprays into both nostrils daily. -     doxycycline (VIBRA-TABS) 100 MG tablet; Take 1 tablet (100 mg total) by mouth 2 (two) times daily.  Hyperglycemia -     Hemoglobin A1c; Future  Abnormal head CT -     Lipid panel; Future -     hydrochlorothiazide (HYDRODIURIL) 12.5 MG tablet;  Take 1 tablet (12.5 mg total) by mouth daily. -     Hemoglobin A1c; Future -     aspirin EC 81 MG tablet; Take 1 tablet (81 mg total) by mouth daily. -     TSH; Future   I have discontinued Ms. Stacy Chapman's benzonatate and methylPREDNISolone. I am also having her start on cetirizine, fluticasone, hydrochlorothiazide, aspirin EC, and doxycycline. Additionally, I am having her maintain her albuterol, guaiFENesin, acetaminophen, and tiZANidine.  Meds ordered this encounter  Medications  . cetirizine (ZYRTEC) 10 MG tablet    Sig: Take 1 tablet (10 mg total) by mouth daily.    Dispense:  30 tablet    Refill:  0    Order Specific Question:   Supervising Provider    Answer:   Cassandria Anger [[id number]]  . fluticasone (FLONASE) 50 MCG/ACT nasal spray    Sig: Place 2 sprays into both nostrils daily.    Dispense:  16 g    Refill:  6    Order Specific Question:   Supervising Provider    Answer:   Cassandria Anger [[id number]]  . hydrochlorothiazide (HYDRODIURIL) 12.5 MG tablet    Sig: Take 1 tablet (12.5 mg total) by mouth daily.    Dispense:  30 tablet    Refill:  1    Order Specific Question:   Supervising Provider    Answer:   Cassandria Anger [[id number]]  . aspirin EC 81 MG tablet    Sig: Take 1 tablet (81 mg total) by mouth daily.    Order Specific Question:   Supervising Provider    Answer:   Cassandria Anger [[id number]]  . doxycycline (VIBRA-TABS) 100 MG tablet    Sig: Take 1 tablet (100 mg total) by mouth 2 (two) times daily.    Dispense:  14 tablet    Refill:  0    Order Specific Question:   Supervising Provider    Answer:   Cassandria Anger [[id number]]   Follow-up: Return in about 4 weeks (around 02/19/2017) for HTN.  Wilfred Lacy, NP

## 2017-01-22 NOTE — Patient Instructions (Signed)
Concussion, Adult A concussion is a brain injury from a direct hit (blow) to the head or body. This injury causes the brain to shake quickly back and forth inside the skull. It is caused by:  A hit to the head.  A quick and sudden movement (jolt) of the head or neck. How fast you will get better from a concussion depends on many things like how bad your concussion was, what part of your brain was hurt, how old you are, and how healthy you were before the concussion. Recovery can take time. It is important to wait to return to activity until a doctor says it is safe and your symptoms are all gone. Follow these instructions at home: Activity   Limit activities that need a lot of thought or concentration. These include:  Homework or work for your job.  Watching TV.  Computer work.  Playing memory games and puzzles.  Rest. Rest helps the brain to heal. Make sure you:  Get plenty of sleep at night. Do not stay up late.  Go to bed at the same time every day.  Rest during the day. Take naps or rest breaks when you feel tired.  It can be dangerous if you get another concussion before the first one has healed Do not do activities that could cause a second concussion, such as riding a bike or playing sports.  Ask your doctor when you can return to your normal activities, like driving, riding a bike, or using machinery. Your ability to react may be slower. Do not do these activities if you are dizzy. Your doctor will likely give you a plan for slowly going back to activities. General instructions   Take over-the-counter and prescription medicines only as told by your doctor.  Do not drink alcohol until your doctor says you can.  If it is harder than usual to remember things, write them down.  If you are easily distracted, try to do one thing at a time. For example, do not try to watch TV while making dinner.  Talk with family members or close friends when you need to make important  decisions.  Watch your symptoms and tell other people to do the same. Other problems (complications) can happen after a concussion. Older adults with a brain injury may have a higher risk of serious problems, such as a blood clot in the brain.  Tell your teachers, school nurse, school counselor, coach, Product/process development scientist, or work Freight forwarder about your injury and symptoms. Tell them about what you can or cannot do. They should watch for:  More problems with attention or concentration.  More trouble remembering or learning new information.  More time needed to do tasks or assignments.  Being more annoyed (irritable) or having a harder time dealing with stress.  Any other symptoms that get worse.  Keep all follow-up visits as told by your health care provider. This is important. Prevention   It is very important that you donot get another brain injury, especially before you have healed. In rare cases, another injury can cause permanent brain damage, brain swelling, or death. You have the most risk if you get another head injury in the first 7-10 days after you were hurt before. To avoid injuries:  Wear a seat belt when you ride in a car.  Do not drink too much alcohol.  Avoid activities that could make you get a second concussion, like contact sports.  Wear a helmet when you do activities like:  Biking.  Skiing.  Skateboarding.  Skating.  Make your home safe by:  Removing things from the floor or stairs that could make you trip.  Using grab bars in bathrooms and handrails by stairs.  Placing non-slip mats on floors and in bathtubs.  Putting more light in dark areas. Contact a doctor if:  Your symptoms get worse.  You have new symptoms.  You keep having symptoms for more than 2 weeks. Get help right away if:  You have bad headaches, or your headaches get worse.  You have weakness in any part of your body.  You have loss of feeling (numbness).  You feel off  balance.  You keep throwing up (vomiting).  You feel more sleepy.  The black center of one eye (pupil) is bigger than the other one.  You twitch or shake violently (convulse) or have a seizure.  Your speech is not clear (is slurred).  You feel more tired, more confused, or more annoyed.  You do not recognize people or places.  You have neck pain.  It is hard to wake you up.  You have strange behavior changes.  You pass out (lose consciousness). Summary  A concussion is a brain injury from a direct hit (blow) to the head or body.  This condition is treated with rest and careful watching of symptoms.  If you keep having symptoms for more than 2 weeks, call your doctor. This information is not intended to replace advice given to you by your health care provider. Make sure you discuss any questions you have with your health care provider. Document Released: 09/17/2009 Document Revised: 09/13/2016 Document Reviewed: 09/13/2016 Elsevier Interactive Patient Education  2017 [place] DASH stands for "Dietary Approaches to Stop Hypertension." The DASH eating plan is a healthy eating plan that has been shown to reduce high blood pressure (hypertension). It may also reduce your risk for type 2 diabetes, heart disease, and stroke. The DASH eating plan may also help with weight loss. What are tips for following this plan? General guidelines   Avoid eating more than 2,300 mg (milligrams) of salt (sodium) a day. If you have hypertension, you may need to reduce your sodium intake to 1,500 mg a day.  Limit alcohol intake to no more than 1 drink a day for nonpregnant women and 2 drinks a day for men. One drink equals 12 oz of beer, 5 oz of wine, or 1 oz of hard liquor.  Work with your health care provider to maintain a healthy body weight or to lose weight. Ask what an ideal weight is for you.  Get at least 30 minutes of exercise that causes your heart to beat  faster (aerobic exercise) most days of the week. Activities may include walking, swimming, or biking.  Work with your health care provider or diet and nutrition specialist (dietitian) to adjust your eating plan to your individual calorie needs. Reading food labels   Check food labels for the amount of sodium per serving. Choose foods with less than 5 percent of the Daily Value of sodium. Generally, foods with less than 300 mg of sodium per serving fit into this eating plan.  To find whole grains, look for the word "whole" as the first word in the ingredient list. Shopping   Buy products labeled as "low-sodium" or "no salt added."  Buy fresh foods. Avoid canned foods and premade or frozen meals. Cooking   Avoid adding salt when cooking. Use salt-free  seasonings or herbs instead of table salt or sea salt. Check with your health care provider or pharmacist before using salt substitutes.  Do not fry foods. Cook foods using healthy methods such as baking, boiling, grilling, and broiling instead.  Cook with heart-healthy oils, such as olive, canola, soybean, or sunflower oil. Meal planning    Eat a balanced diet that includes:  5 or more servings of fruits and vegetables each day. At each meal, try to fill half of your plate with fruits and vegetables.  Up to 6-8 servings of whole grains each day.  Less than 6 oz of lean meat, poultry, or fish each day. A 3-oz serving of meat is about the same size as a deck of cards. One egg equals 1 oz.  2 servings of low-fat dairy each day.  A serving of nuts, seeds, or beans 5 times each week.  Heart-healthy fats. Healthy fats called Omega-3 fatty acids are found in foods such as flaxseeds and coldwater fish, like sardines, salmon, and mackerel.  Limit how much you eat of the following:  Canned or prepackaged foods.  Food that is high in trans fat, such as fried foods.  Food that is high in saturated fat, such as fatty meat.  Sweets,  desserts, sugary drinks, and other foods with added sugar.  Full-fat dairy products.  Do not salt foods before eating.  Try to eat at least 2 vegetarian meals each week.  Eat more home-cooked food and less restaurant, buffet, and fast food.  When eating at a restaurant, ask that your food be prepared with less salt or no salt, if possible. What foods are recommended? The items listed may not be a complete list. Talk with your dietitian about what dietary choices are best for you. Grains  Whole-grain or whole-wheat bread. Whole-grain or whole-wheat pasta. Brown rice. [place]. Bulgur. Whole-grain and low-sodium cereals. Pita bread. Low-fat, low-sodium crackers. Whole-wheat flour tortillas. Vegetables  Fresh or frozen vegetables (raw, steamed, roasted, or grilled). Low-sodium or reduced-sodium tomato and vegetable juice. Low-sodium or reduced-sodium tomato sauce and tomato paste. Low-sodium or reduced-sodium canned vegetables. Fruits  All fresh, dried, or frozen fruit. Canned fruit in natural juice (without added sugar). Meat and other protein foods  Skinless chicken or [place]. Ground chicken or [place]. Pork with fat trimmed off. Fish and seafood. Egg whites. Dried beans, peas, or lentils. Unsalted nuts, nut butters, and seeds. Unsalted canned beans. Lean cuts of beef with fat trimmed off. Low-sodium, lean deli meat. Dairy  Low-fat (1%) or fat-free (skim) milk. Fat-free, low-fat, or reduced-fat cheeses. Nonfat, low-sodium ricotta or cottage cheese. Low-fat or nonfat yogurt. Low-fat, low-sodium cheese. Fats and oils  Soft margarine without trans fats. Vegetable oil. Low-fat, reduced-fat, or light mayonnaise and salad dressings (reduced-sodium). Canola, safflower, olive, soybean, and sunflower oils. Avocado. Seasoning and other foods  Herbs. Spices. Seasoning mixes without salt. Unsalted popcorn and pretzels. Fat-free sweets. What foods are not recommended? The items listed may not be  a complete list. Talk with your dietitian about what dietary choices are best for you. Grains  Baked goods made with fat, such as croissants, muffins, or some breads. Dry pasta or rice meal packs. Vegetables  Creamed or fried vegetables. Vegetables in a cheese sauce. Regular canned vegetables (not low-sodium or reduced-sodium). Regular canned tomato sauce and paste (not low-sodium or reduced-sodium). Regular tomato and vegetable juice (not low-sodium or reduced-sodium). Angie Fava. Olives. Fruits  Canned fruit in a light or heavy syrup. Fried fruit. Fruit in  cream or butter sauce. Meat and other protein foods  Fatty cuts of meat. Ribs. Fried meat. Berniece Salines. Sausage. Bologna and other processed lunch meats. Salami. Fatback. Hotdogs. Bratwurst. Salted nuts and seeds. Canned beans with added salt. Canned or smoked fish. Whole eggs or egg yolks. Chicken or [place] with skin. Dairy  Whole or 2% milk, cream, and half-and-half. Whole or full-fat cream cheese. Whole-fat or sweetened yogurt. Full-fat cheese. Nondairy creamers. Whipped toppings. Processed cheese and cheese spreads. Fats and oils  Butter. Stick margarine. Lard. Shortening. Ghee. Bacon fat. Tropical oils, such as coconut, palm kernel, or palm oil. Seasoning and other foods  Salted popcorn and pretzels. Onion salt, garlic salt, seasoned salt, table salt, and sea salt. Worcestershire sauce. Tartar sauce. Barbecue sauce. Teriyaki sauce. Soy sauce, including reduced-sodium. Steak sauce. Canned and packaged gravies. Fish sauce. Oyster sauce. Cocktail sauce. Horseradish that you find on the shelf. Ketchup. Mustard. Meat flavorings and tenderizers. Bouillon cubes. Hot sauce and Tabasco sauce. Premade or packaged marinades. Premade or packaged taco seasonings. Relishes. Regular salad dressings. Where to find more information:  National Heart, Lung, and [place]: https://wilson-eaton.com/  American Heart Association: www.heart.org Summary  The DASH eating  plan is a healthy eating plan that has been shown to reduce high blood pressure (hypertension). It may also reduce your risk for type 2 diabetes, heart disease, and stroke.  With the DASH eating plan, you should limit salt (sodium) intake to 2,300 mg a day. If you have hypertension, you may need to reduce your sodium intake to 1,500 mg a day.  When on the DASH eating plan, aim to eat more fresh fruits and vegetables, whole grains, lean proteins, low-fat dairy, and heart-healthy fats.  Work with your health care provider or diet and nutrition specialist (dietitian) to adjust your eating plan to your individual calorie needs. This information is not intended to replace advice given to you by your health care provider. Make sure you discuss any questions you have with your health care provider. Document Released: 09/18/2011 Document Revised: 09/22/2016 Document Reviewed: 09/22/2016 Elsevier Interactive Patient Education  2017 [employer].

## 2017-01-23 ENCOUNTER — Other Ambulatory Visit (INDEPENDENT_AMBULATORY_CARE_PROVIDER_SITE_OTHER): Payer: BLUE CROSS/BLUE SHIELD

## 2017-01-23 DIAGNOSIS — R030 Elevated blood-pressure reading, without diagnosis of hypertension: Secondary | ICD-10-CM | POA: Diagnosis not present

## 2017-01-23 DIAGNOSIS — R930 Abnormal findings on diagnostic imaging of skull and head, not elsewhere classified: Secondary | ICD-10-CM | POA: Diagnosis not present

## 2017-01-23 DIAGNOSIS — E119 Type 2 diabetes mellitus without complications: Secondary | ICD-10-CM | POA: Diagnosis not present

## 2017-01-23 DIAGNOSIS — R748 Abnormal levels of other serum enzymes: Secondary | ICD-10-CM

## 2017-01-23 DIAGNOSIS — E782 Mixed hyperlipidemia: Secondary | ICD-10-CM

## 2017-01-23 LAB — HEPATITIS C ANTIBODY: HCV Ab: NEGATIVE

## 2017-01-23 LAB — LIPID PANEL
Cholesterol: 217 mg/dL — ABNORMAL HIGH (ref 0–200)
HDL: 72.2 mg/dL (ref >39.00)
LDL Cholesterol: 115 mg/dL — ABNORMAL HIGH (ref 0–99)
NonHDL: 144.68
TRIGLYCERIDES: 148 mg/dL (ref 0.0–149.0)
Total CHOL/HDL Ratio: 3
VLDL: 29.6 mg/dL (ref 0.0–40.0)

## 2017-01-23 LAB — HEPATITIS B SURFACE ANTIGEN: Hepatitis B Surface Ag: NEGATIVE

## 2017-01-23 LAB — HEMOGLOBIN A1C: Hgb A1c MFr Bld: 6.6 % — ABNORMAL HIGH (ref 4.6–6.5)

## 2017-01-23 LAB — TSH: TSH: 1.05 u[IU]/mL (ref 0.35–4.50)

## 2017-01-23 MED ORDER — PRAVASTATIN SODIUM 10 MG PO TABS: 10.0000 mg | ORAL_TABLET | Freq: Every day | ORAL | 3 refills | Status: DC

## 2017-01-23 MED ORDER — METFORMIN HCL 500 MG PO TABS: 500.0000 mg | ORAL_TABLET | Freq: Two times a day (BID) | ORAL | 3 refills | Status: DC

## 2017-01-26 ENCOUNTER — Telehealth: Payer: Self-pay | Admitting: Nurse Practitioner

## 2017-01-26 DIAGNOSIS — Z0289 Encounter for other administrative examinations: Secondary | ICD-10-CM

## 2017-01-26 NOTE — Telephone Encounter (Signed)
Stop HCTZ,metformin and prvastatin. Complete doxycycline for sinusitis. Follow up in 4weeks as discussed

## 2017-01-26 NOTE — Telephone Encounter (Signed)
Pt verbalized understand.  

## 2017-01-26 NOTE — Telephone Encounter (Signed)
Pt called report that she might have side effect to BP med that we gave her on 01/22/17: sweaty, unable able to walk,dizzy. Pt stated she took this med about 10 years ago and had the same symptoms.   Pt stop this med on Saturday and said she is feeling better,able to walk,less dizzy.   Please advise, she is unsure is this  Is because of BP med or other med we gave her (metformin).

## 2017-02-02 ENCOUNTER — Telehealth: Payer: Self-pay | Admitting: Nurse Practitioner

## 2017-02-02 NOTE — Telephone Encounter (Signed)
I had called patient on Friday &left her a message to call back. I did just speak with her to make sure we had an understanding on what they needed. As well as she understood we could not date back to March 26. Patient just needs the one page filled out for the form that is the attending provider statement. As well as any office notes we may have if we want to add them.   Stacy Chapman once you get this completed I can fax it and all for you. Thank you.

## 2017-02-02 NOTE — Telephone Encounter (Signed)
Please check with [place]. Let me know if I need to do anything.   thx

## 2017-02-02 NOTE — Telephone Encounter (Signed)
Pt called regarding her FMLA paperwork, she brought it on the 12th. Some of it was sent out but not all of it they are missing the, Attending physicians statement. They did not receive that part of the paperwork.  This is due Wednesday.

## 2017-02-03 NOTE — Telephone Encounter (Signed)
Paper work completed, faxed, copy to [place].

## 2017-02-03 NOTE — Telephone Encounter (Signed)
Copy up front for patient. Patient informed.

## 2017-02-19 ENCOUNTER — Encounter: Payer: Self-pay | Admitting: Nurse Practitioner

## 2017-02-19 ENCOUNTER — Ambulatory Visit (INDEPENDENT_AMBULATORY_CARE_PROVIDER_SITE_OTHER): Payer: BLUE CROSS/BLUE SHIELD | Admitting: Nurse Practitioner

## 2017-02-19 VITALS — BP 160/80 | HR 80 | Temp 98.0°F | Ht 68.0 in | Wt 237.0 lb

## 2017-02-19 DIAGNOSIS — J9801 Acute bronchospasm: Secondary | ICD-10-CM

## 2017-02-19 DIAGNOSIS — J40 Bronchitis, not specified as acute or chronic: Secondary | ICD-10-CM | POA: Diagnosis not present

## 2017-02-19 DIAGNOSIS — I10 Essential (primary) hypertension: Secondary | ICD-10-CM

## 2017-02-19 DIAGNOSIS — J309 Allergic rhinitis, unspecified: Secondary | ICD-10-CM | POA: Diagnosis not present

## 2017-02-19 LAB — POCT EXHALED NITRIC OXIDE: FeNO level (ppb): 127

## 2017-02-19 MED ORDER — IPRATROPIUM-ALBUTEROL 0.5-2.5 (3) MG/3ML IN SOLN
3.0000 mL | Freq: Once | RESPIRATORY_TRACT | Status: AC
  Administered 2017-02-19: 3 mL via RESPIRATORY_TRACT

## 2017-02-19 MED ORDER — METHYLPREDNISOLONE ACETATE 40 MG/ML IJ SUSP
40.0000 mg | Freq: Once | INTRAMUSCULAR | Status: AC
  Administered 2017-02-19: 40 mg via INTRAMUSCULAR

## 2017-02-19 MED ORDER — FLUTICASONE-SALMETEROL 115-21 MCG/ACT IN AERO: 2.0000 | INHALATION_SPRAY | Freq: Two times a day (BID) | RESPIRATORY_TRACT | 3 refills | Status: DC

## 2017-02-19 MED ORDER — AMLODIPINE BESYLATE 5 MG PO TABS: 5.0000 mg | ORAL_TABLET | Freq: Every day | ORAL | 3 refills | Status: DC

## 2017-02-19 MED ORDER — MONTELUKAST SODIUM 10 MG PO TABS: 10.0000 mg | ORAL_TABLET | Freq: Every day | ORAL | 3 refills | Status: DC

## 2017-02-19 NOTE — Patient Instructions (Addendum)
monitor BP at home and record.   Asthma, Adult Asthma is a recurring condition in which the airways tighten and narrow. Asthma can make it difficult to breathe. It can cause coughing, wheezing, and shortness of breath. Asthma episodes, also called asthma attacks, range from minor to life-threatening. Asthma cannot be cured, but medicines and lifestyle changes can help control it. What are the causes? Asthma is believed to be caused by inherited (genetic) and environmental factors, but its exact cause is unknown. Asthma may be triggered by allergens, lung infections, or irritants in the air. Asthma triggers are different for each person. Common triggers include:  Animal dander.  Dust mites.  Cockroaches.  Pollen from trees or grass.  Mold.  Smoke.  Air pollutants such as dust, household cleaners, hair sprays, aerosol sprays, paint fumes, strong chemicals, or strong odors.  Cold air, weather changes, and winds (which increase molds and pollens in the air).  Strong emotional expressions such as crying or laughing hard.  Stress.  Certain medicines (such as aspirin) or types of drugs (such as beta-blockers).  Sulfites in foods and drinks. Foods and drinks that may contain sulfites include dried fruit, potato chips, and sparkling grape juice.  Infections or inflammatory conditions such as the flu, a cold, or an inflammation of the nasal membranes (rhinitis).  Gastroesophageal reflux disease (GERD).  Exercise or strenuous activity. What are the signs or symptoms? Symptoms may occur immediately after asthma is triggered or many hours later. Symptoms include:  Wheezing.  Excessive nighttime or early morning coughing.  Frequent or severe coughing with a common cold.  Chest tightness.  Shortness of breath. How is this diagnosed? The diagnosis of asthma is made by a review of your medical history and a physical exam. Tests may also be performed. These may include:  Lung  function studies. These tests show how much air you breathe in and out.  Allergy tests.  Imaging tests such as X-rays. How is this treated? Asthma cannot be cured, but it can usually be controlled. Treatment involves identifying and avoiding your asthma triggers. It also involves medicines. There are 2 classes of medicine used for asthma treatment:  Controller medicines. These prevent asthma symptoms from occurring. They are usually taken every day.  Reliever or rescue medicines. These quickly relieve asthma symptoms. They are used as needed and provide short-term relief. Your health care provider will help you create an asthma action plan. An asthma action plan is a written plan for managing and treating your asthma attacks. It includes a list of your asthma triggers and how they may be avoided. It also includes information on when medicines should be taken and when their dosage should be changed. An action plan may also involve the use of a device called a peak flow meter. A peak flow meter measures how well the lungs are working. It helps you monitor your condition. Follow these instructions at home:  Take medicines only as directed by your health care provider. Speak with your health care provider if you have questions about how or when to take the medicines.  Use a peak flow meter as directed by your health care provider. Record and keep track of readings.  Understand and use the action plan to help minimize or stop an asthma attack without needing to seek medical care.  Control your home environment in the following ways to help prevent asthma attacks:  Do not smoke. Avoid being exposed to secondhand smoke.  Change your heating and air conditioning  filter regularly.  Limit your use of fireplaces and wood stoves.  Get rid of pests (such as roaches and mice) and their droppings.  Throw away plants if you see mold on them.  Clean your floors and dust regularly. Use unscented  cleaning products.  Try to have someone else vacuum for you regularly. Stay out of rooms while they are being vacuumed and for a short while afterward. If you vacuum, use a dust mask from a hardware store, a double-layered or microfilter vacuum cleaner bag, or a vacuum cleaner with a HEPA filter.  Replace carpet with wood, tile, or vinyl flooring. Carpet can trap dander and dust.  Use allergy-proof pillows, mattress covers, and box spring covers.  Wash bed sheets and blankets every week in hot water and dry them in a dryer.  Use blankets that are made of polyester or cotton.  Clean bathrooms and kitchens with bleach. If possible, have someone repaint the walls in these rooms with mold-resistant paint. Keep out of the rooms that are being cleaned and painted.  Wash hands frequently. Contact a health care provider if:  You have wheezing, shortness of breath, or a cough even if taking medicine to prevent attacks.  The colored mucus you cough up (sputum) is thicker than usual.  Your sputum changes from clear or white to yellow, green, gray, or bloody.  You have any problems that may be related to the medicines you are taking (such as a rash, itching, swelling, or trouble breathing).  You are using a reliever medicine more than 2-3 times per week.  Your peak flow is still at 50-79% of your personal best after following your action plan for 1 hour.  You have a fever. Get help right away if:  You seem to be getting worse and are unresponsive to treatment during an asthma attack.  You are short of breath even at rest.  You get short of breath when doing very little physical activity.  You have difficulty eating, drinking, or talking due to asthma symptoms.  You develop chest pain.  You develop a fast heartbeat.  You have a bluish color to your lips or fingernails.  You are light-headed, dizzy, or faint.  Your peak flow is less than 50% of your personal best. This information  is not intended to replace advice given to you by your health care provider. Make sure you discuss any questions you have with your health care provider. Document Released: 09/29/2005 Document Revised: 03/12/2016 Document Reviewed: 04/28/2013 Elsevier Interactive Patient Education  2017 [employer].

## 2017-02-19 NOTE — Progress Notes (Signed)
Subjective:  Patient ID: Stacy Chapman, female    DOB: 02/26/1956  Age: 61 y.o. MRN: [id number]  CC: Follow-up (4 wk fu/still coghing--still has SOB and wheezing-stop meds due to side effect)   Cough  This is a recurrent problem. The problem has been waxing and waning. The problem occurs constantly. The cough is productive of sputum. Associated symptoms include nasal congestion, postnasal drip, rhinorrhea, a sore throat, shortness of breath and wheezing. Pertinent negatives include no chest pain, chills, ear congestion, ear pain, fever, headaches, heartburn, hemoptysis, myalgias or rash. The symptoms are aggravated by lying down and cold air. She has tried a beta-agonist inhaler, OTC cough suppressant and oral steroids (and zyrtec) for the symptoms. The treatment provided mild relief. Her past medical history is significant for bronchitis and environmental allergies. There is no history of asthma, bronchiectasis, COPD, emphysema or pneumonia.    HTN: Unable to tolerate HCTZ. Developed dizziness. Persistent BP elevation. She has chnaged diet to heart healthy diet.  Outpatient Medications Prior to Visit  Medication Sig Dispense Refill  . acetaminophen (TYLENOL) 500 MG tablet Take 1 tablet (500 mg total) by mouth every 6 (six) hours as needed. 30 tablet 0  . albuterol (PROVENTIL HFA;VENTOLIN HFA) 108 (90 Base) MCG/ACT inhaler Inhale 2 puffs into the lungs every 6 (six) hours as needed for wheezing or shortness of breath. 1 Inhaler 2  . aspirin EC 81 MG tablet Take 1 tablet (81 mg total) by mouth daily.    . fluticasone (FLONASE) 50 MCG/ACT nasal spray Place 2 sprays into both nostrils daily. 16 g 6  . guaiFENesin (MUCINEX) 600 MG 12 hr tablet Take 1 tablet (600 mg total) by mouth 2 (two) times daily as needed for cough or to loosen phlegm. 14 tablet 0  . cetirizine (ZYRTEC) 10 MG tablet Take 1 tablet (10 mg total) by mouth daily. 30 tablet 0  . hydrochlorothiazide (HYDRODIURIL) 12.5 MG  tablet Take 1 tablet (12.5 mg total) by mouth daily. (Patient not taking: Reported on 02/19/2017) 30 tablet 1  . metFORMIN (GLUCOPHAGE) 500 MG tablet Take 1 tablet (500 mg total) by mouth 2 (two) times daily with a meal. (Patient not taking: Reported on 02/19/2017) 60 tablet 3  . pravastatin (PRAVACHOL) 10 MG tablet Take 1 tablet (10 mg total) by mouth at bedtime. (Patient not taking: Reported on 02/19/2017) 30 tablet 3  . tiZANidine (ZANAFLEX) 4 MG capsule Take 1 capsule (4 mg total) by mouth 3 (three) times daily as needed for muscle spasms. (Patient not taking: Reported on 02/19/2017) 20 capsule 0   No facility-administered medications prior to visit.     ROS See HPI  Objective:  BP (!) 160/80   Pulse 80   Temp 98 F (36.7 C)   Ht 5\' 8"  (1.727 m)   Wt 237 lb (107.5 kg)   SpO2 99%   BMI 36.04 kg/m   BP Readings from Last 3 Encounters:  02/19/17 (!) 160/80  01/22/17 (!) 146/74  01/15/17 (!) 150/80    Wt Readings from Last 3 Encounters:  02/19/17 237 lb (107.5 kg)  01/22/17 240 lb (108.9 kg)  01/15/17 236 lb (107 kg)    Physical Exam  Constitutional: She is oriented to person, place, and time. No distress.  HENT:  Mouth/Throat: No oropharyngeal exudate.  Neck: Normal range of motion. Neck supple.  Cardiovascular: Normal rate and normal heart sounds.   Pulmonary/Chest: No respiratory distress. She has wheezes. She has no rales.  Musculoskeletal: She exhibits  no edema.  Lymphadenopathy:    She has no cervical adenopathy.  Neurological: She is alert and oriented to person, place, and time.  Skin: Skin is warm and dry.  Vitals reviewed.   Lab Results  Component Value Date   WBC 6.5 01/15/2017   HGB 10.8 (L) 01/15/2017   HCT 33.5 (L) 01/15/2017   PLT 324.0 01/15/2017   GLUCOSE 99 01/15/2017   CHOL 217 (H) 01/23/2017   TRIG 148.0 01/23/2017   HDL 72.20 01/23/2017   LDLCALC 115 (H) 01/23/2017   ALT 37 (H) 01/15/2017   AST 44 (H) 01/15/2017   NA 139 01/15/2017   K  4.2 01/15/2017   CL 107 01/15/2017   CREATININE 0.85 01/15/2017   BUN 16 01/15/2017   CO2 27 01/15/2017   TSH 1.05 01/23/2017   HGBA1C 6.6 (H) 01/23/2017    Ct Head Wo Contrast  Result Date: 01/20/2017 CLINICAL DATA:  61 year old female with headaches and blurry vision since 01/06/2017 when she fell in her bathroom and hit the left frontoparietal region on the bathtub EXAM: CT HEAD WITHOUT CONTRAST TECHNIQUE: Contiguous axial images were obtained from the base of the skull through the vertex without intravenous contrast. COMPARISON:  None. FINDINGS: Brain: No evidence of acute infarction, hemorrhage, hydrocephalus, extra-axial collection or mass lesion/mass effect. Mild periventricular, subcortical and deep white matter hypoattenuation which is nonspecific but most suggestive of chronic microvascular ischemic white matter disease. Vascular: No hyperdense vessel or unexpected calcification. Skull: Normal. Negative for acute fracture or focal lesion. Likely remote left nasal bone fracture. Sinuses/Orbits: Moderate mucoperiosteal thickening throughout the ethmoid air cells and right maxillary sinus. Small air-fluid levels are present within the left maxillary sinus, sphenoid sinus and multiple ethmoid sinuses. The mastoid air cells are well aerated bilaterally. Other: None. IMPRESSION: 1. No acute intracranial abnormality. 2. 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 3. Moderate inflammatory paranasal sinus disease. Acute sinusitis is not excluded. 4. Likely remote left nasal bone fracture. Electronically Signed   By: Jacqulynn Cadet M.D.   On: 01/20/2017 15:18    Assessment & Plan:   Stacy Chapman was seen today for follow-up.  Diagnoses and all orders for this visit:  HTN (hypertension), benign -     amLODipine (NORVASC) 5 MG tablet; Take 1 tablet (5 mg total) by mouth at bedtime.  Cough due to bronchospasm -     POCT EXHALED NITRIC OXIDE -     methylPREDNISolone  acetate (DEPO-MEDROL) injection 40 mg; Inject 1 mL (40 mg total) into the muscle once.  Bronchitis -     ipratropium-albuterol (DUONEB) 0.5-2.5 (3) MG/3ML nebulizer solution 3 mL; Take 3 mLs by nebulization once. -     fluticasone-salmeterol (ADVAIR HFA) 115-21 MCG/ACT inhaler; Inhale 2 puffs into the lungs 2 (two) times daily. -     montelukast (SINGULAIR) 10 MG tablet; Take 1 tablet (10 mg total) by mouth at bedtime. -     methylPREDNISolone acetate (DEPO-MEDROL) injection 40 mg; Inject 1 mL (40 mg total) into the muscle once.  Allergic rhinitis, unspecified seasonality, unspecified trigger -     montelukast (SINGULAIR) 10 MG tablet; Take 1 tablet (10 mg total) by mouth at bedtime. -     methylPREDNISolone acetate (DEPO-MEDROL) injection 40 mg; Inject 1 mL (40 mg total) into the muscle once.   I have discontinued Ms. Makara's tiZANidine, cetirizine, hydrochlorothiazide, metFORMIN, and pravastatin. I am also having her start on amLODipine, fluticasone-salmeterol, and montelukast. Additionally, I am having her maintain her albuterol,  guaiFENesin, acetaminophen, fluticasone, and aspirin EC. We administered ipratropium-albuterol. We will continue to administer methylPREDNISolone acetate.  Meds ordered this encounter  Medications  . amLODipine (NORVASC) 5 MG tablet    Sig: Take 1 tablet (5 mg total) by mouth at bedtime.    Dispense:  30 tablet    Refill:  3    Order Specific Question:   Supervising Provider    Answer:   Cassandria Anger [[id number]]  . ipratropium-albuterol (DUONEB) 0.5-2.5 (3) MG/3ML nebulizer solution 3 mL  . fluticasone-salmeterol (ADVAIR HFA) 115-21 MCG/ACT inhaler    Sig: Inhale 2 puffs into the lungs 2 (two) times daily.    Dispense:  1 Inhaler    Refill:  3    Order Specific Question:   Supervising Provider    Answer:   Cassandria Anger [[id number]]  . montelukast (SINGULAIR) 10 MG tablet    Sig: Take 1 tablet (10 mg total) by mouth at bedtime.    Dispense:  30  tablet    Refill:  3    Order Specific Question:   Supervising Provider    Answer:   Cassandria Anger [[id number]]  . methylPREDNISolone acetate (DEPO-MEDROL) injection 40 mg    Follow-up: Return in about 2 weeks (around 03/05/2017) for HTN and bronchitis.  Wilfred Lacy, NP

## 2017-02-24 NOTE — Telephone Encounter (Signed)
Pt called stating Stacy Chapman does not have enough information to process her claim, they need office notes when she was out of work  which was 5-19 of [place].  Can we send all three AVS from the three most recent visits?  To [facility] Fax number [phone]

## 2017-02-25 NOTE — Telephone Encounter (Signed)
Spoke with patient. She states she had called medical records. They told her they did not have any of the informing ?? Patient states she needs the office notes from all visits. She also needs the notes from her hospital encounter that goes along with everything. I am unclear what to do at this point.? I would think that they would be able to get everything she needs. Please advise?

## 2017-02-25 NOTE — Telephone Encounter (Signed)
Would you like me to send AVS from office visits? Please advise. Thank you.

## 2017-02-25 NOTE — Telephone Encounter (Signed)
Spoke with patient and informed her again to call medical records. I called and left a VM with medical records today to inform of what was going on. She did not have the right number I am really unsure who she was speaking with. She is going to call our medical records office and get the information she needs. She will follow up if she has any other questions.

## 2017-02-25 NOTE — Telephone Encounter (Signed)
She will have to go through medical records to get all her records. I do not have control over that.

## 2017-02-25 NOTE — Telephone Encounter (Signed)
AVS may not have enough information. She will need to contact medical records for office notes.

## 2017-03-05 ENCOUNTER — Encounter: Payer: Self-pay | Admitting: Nurse Practitioner

## 2017-03-05 ENCOUNTER — Ambulatory Visit (INDEPENDENT_AMBULATORY_CARE_PROVIDER_SITE_OTHER): Payer: BLUE CROSS/BLUE SHIELD | Admitting: Nurse Practitioner

## 2017-03-05 VITALS — BP 162/80 | HR 75 | Temp 97.9°F | Ht 68.0 in | Wt 234.0 lb

## 2017-03-05 DIAGNOSIS — S060X0S Concussion without loss of consciousness, sequela: Secondary | ICD-10-CM

## 2017-03-05 DIAGNOSIS — I10 Essential (primary) hypertension: Secondary | ICD-10-CM | POA: Diagnosis not present

## 2017-03-05 DIAGNOSIS — J4540 Moderate persistent asthma, uncomplicated: Secondary | ICD-10-CM

## 2017-03-05 DIAGNOSIS — J40 Bronchitis, not specified as acute or chronic: Secondary | ICD-10-CM | POA: Insufficient documentation

## 2017-03-05 MED ORDER — LISINOPRIL 5 MG PO TABS: 5.0000 mg | ORAL_TABLET | Freq: Every day | ORAL | 3 refills | Status: DC

## 2017-03-05 MED ORDER — AMLODIPINE BESYLATE 10 MG PO TABS: 10.0000 mg | ORAL_TABLET | Freq: Every day | ORAL | 1 refills | Status: DC

## 2017-03-05 MED ORDER — CETIRIZINE HCL 10 MG PO TABS: 10.0000 mg | ORAL_TABLET | Freq: Every day | ORAL | 0 refills | Status: DC

## 2017-03-05 NOTE — Patient Instructions (Addendum)
She declined referral to concussion clinic (sports medicine) or neurology at this time.  Concussion, Adult A concussion is a brain injury from a direct hit (blow) to the head or body. This blow causes the brain to shake quickly back and forth inside the skull. This can damage brain cells and cause chemical changes in the brain. A concussion may also be known as a mild traumatic brain injury (TBI). Concussions are usually not life-threatening, but the effects of a concussion can be serious. If you have a concussion, you are more likely to experience concussion-like symptoms after a direct blow to the head in the future. What are the causes? This condition is caused by:  A direct blow to the head, such as from running into another player during a game, being hit in a fight, or hitting your head on a hard surface.  A jolt of the head or neck that causes the brain to move back and forth inside the skull, such as in a car crash. What are the signs or symptoms? The signs of a concussion can be hard to notice. Early on, they may be missed by you, family members, and health care providers. You may look fine but act or feel differently. Symptoms are usually temporary, but they may last for days, weeks, or even longer. Some symptoms may appear right away but other symptoms may not show up for hours or days. Every head injury is different. Symptoms may include:  Headaches. This can include a feeling of pressure in the head.  Memory problems.  Trouble concentrating, organizing, or making decisions.  Slowness in thinking, acting or reacting, speaking, or reading.  Confusion.  Fatigue.  Changes in eating or sleeping patterns.  Problems with coordination or balance.  Nausea or vomiting.  Numbness or tingling.  Sensitivity to light or noise.  Vision or hearing problems.  Reduced sense of smell.  Irritability or mood changes.  Dizziness.  Lack of motivation.  Seeing or hearing things  that other people do not see or hear (hallucinations). How is this diagnosed? This condition is diagnosed based on:  Your symptoms.  A description of your injury. You may also have tests, including:  Imaging tests, such as a CT scan or MRI. These are done to look for signs of brain injury.  Neuropsychological tests. These measure your thinking, understanding, learning, and remembering abilities. How is this treated? This condition is treated with physical and mental rest and careful observation, usually at home. If the concussion is severe, you may need to stay home from work for a while. You may be referred to a concussion clinic or to other health care providers for management. It is important that you tell your health care provider if:  You are taking any medicines, including prescription medicines, over-the-counter medicines, and natural remedies. Some medicines, such as blood thinners (anticoagulants) and aspirin, may increase the chance of complications, such as bleeding.  You are taking or have taken alcohol or illegal drugs. Alcohol and certain other drugs may slow your recovery and can put you at risk of further injury. How fast you will recover from a concussion depends on many factors, such as how severe your concussion is, what part of your brain was injured, how old you are, and how healthy you were before the concussion. Recovery can take time. It is important to wait to return to activity until a health care provider says it is safe to do that and your symptoms are completely gone. Follow  these instructions at home: Activity   Limit activities that require a lot of thought or concentration. These may include:  Doing homework or job-related work.  Watching TV.  Working on the computer.  Playing memory games and puzzles.  Rest. Rest helps the brain to heal. Make sure you:  Get plenty of sleep at night. Avoid staying up late at night.  Keep the same bedtime hours on  weekends and weekdays.  Rest during the day. Take naps or rest breaks when you feel tired.  Having another concussion before the first one has healed can be dangerous. Do not do high-risk activities that could cause a second concussion, such as riding a bicycle or playing sports.  Ask your health care provider when you can return to your normal activities, such as school, work, athletics, driving, riding a bicycle, or using heavy machinery. Your ability to react may be slower after a brain injury. Never do these activities if you are dizzy. Your health care provider will likely give you a plan for gradually returning to activities. General instructions   Take over-the-counter and prescription medicines only as told by your health care provider.  Do not drink alcohol until your health care provider says you can.  If it is harder than usual to remember things, write them down.  If you are easily distracted, try to do one thing at a time. For example, do not try to watch TV while fixing dinner.  Talk with family members or close friends when making important decisions.  Watch your symptoms and tell others to do the same. Complications sometimes occur after a concussion. Older adults with a brain injury may have a higher risk of serious complications, such as a blood clot in the brain.  Tell your teachers, school nurse, school counselor, coach, athletic trainer, or work Freight forwarder about your injury, symptoms, and restrictions. Tell them about what you can or cannot do. They should watch for:  Increased problems with attention or concentration.  Increased difficulty remembering or learning new information.  Increased time needed to complete tasks or assignments.  Increased irritability or decreased ability to cope with stress.  Increased symptoms.  Keep all follow-up visits as told by your health care provider. This is important. How is this prevented? It is very important to avoid another  brain injury, especially as you recover. In rare cases, another injury can lead to permanent brain damage, brain swelling, or death. The risk of this is greatest during the first 7-10 days after a head injury. Avoid injuries by:  Wearing a seat belt when riding in a car.  Wearing a helmet when biking, skiing, skateboarding, skating, or doing similar activities.  Avoiding activities that could lead to a second concussion, such as contact or recreational sports, until your health care provider says it is okay.  Taking safety measures in your home, such as:  Removing clutter and tripping hazards from floors and stairways.  Using grab bars in bathrooms and handrails by stairs.  Placing non-slip mats on floors and in bathtubs.  Improving lighting in dim areas. Contact a health care provider if:  Your symptoms get worse.  You have new symptoms.  You continue to have symptoms for more than 2 weeks. Get help right away if:  You have severe or worsening headaches.  You have weakness or numbness in any part of your body.  Your coordination gets worse.  You vomit repeatedly.  You are sleepier.  The pupil of one eye is  larger than the other.  You have convulsions or a seizure.  Your speech is slurred.  Your fatigue, confusion, or irritability gets worse.  You cannot recognize people or places.  You have neck pain.  It is difficult to wake you up.  You have unusual behavior changes.  You lose consciousness. Summary  A concussion is a brain injury from a direct hit (blow) to the head or body.  A concussion may also be called a mild traumatic brain injury (TBI).  You may have imaging tests and neuropsychological tests to diagnose a concussion.  This condition is treated with physical and mental rest and careful observation.  Ask your health care provider when you can return to your normal activities, such as school, work, athletics, driving, riding a bicycle, or using  heavy machinery. Follow safety instructions as told by your health care provider. This information is not intended to replace advice given to you by your health care provider. Make sure you discuss any questions you have with your health care provider. Document Released: 12/20/2003 Document Revised: 09/09/2016 Document Reviewed: 09/09/2016 Elsevier Interactive Patient Education  2017 Stacy Chapman.

## 2017-03-05 NOTE — Progress Notes (Signed)
Subjective:  Patient ID: Stacy Chapman, female    DOB: 1956/09/21  Age: 61 y.o. MRN: [id number]  CC: Follow-up (2 follow up/head painful when touch/ forgetful at times/ light head at times. )  HPI  HTN: does not check BP at home. Taking amlodipine as prescribed.  Concussion: Intermittent headache and dizziness with rapid head movement. No syncope  Cough and SOB: Improved with advair, ventolin and singulair.  Outpatient Medications Prior to Visit  Medication Sig Dispense Refill  . acetaminophen (TYLENOL) 500 MG tablet Take 1 tablet (500 mg total) by mouth every 6 (six) hours as needed. 30 tablet 0  . albuterol (PROVENTIL HFA;VENTOLIN HFA) 108 (90 Base) MCG/ACT inhaler Inhale 2 puffs into the lungs every 6 (six) hours as needed for wheezing or shortness of breath. 1 Inhaler 2  . aspirin EC 81 MG tablet Take 1 tablet (81 mg total) by mouth daily.    . fluticasone-salmeterol (ADVAIR HFA) 115-21 MCG/ACT inhaler Inhale 2 puffs into the lungs 2 (two) times daily. 1 Inhaler 3  . montelukast (SINGULAIR) 10 MG tablet Take 1 tablet (10 mg total) by mouth at bedtime. 30 tablet 3  . amLODipine (NORVASC) 5 MG tablet Take 1 tablet (5 mg total) by mouth at bedtime. 30 tablet 3  . fluticasone (FLONASE) 50 MCG/ACT nasal spray Place 2 sprays into both nostrils daily. (Patient not taking: Reported on 03/05/2017) 16 g 6  . guaiFENesin (MUCINEX) 600 MG 12 hr tablet Take 1 tablet (600 mg total) by mouth 2 (two) times daily as needed for cough or to loosen phlegm. (Patient not taking: Reported on 03/05/2017) 14 tablet 0   No facility-administered medications prior to visit.     ROS Review of Systems  Constitutional: Negative for chills and fever.  HENT: Positive for congestion. Negative for sinus pain and sore throat.   Eyes: Negative for blurred vision, double vision, photophobia and pain.       Negative for visual changes  Respiratory: Negative for cough and shortness of breath.     Cardiovascular: Positive for leg swelling. Negative for chest pain and palpitations.  Musculoskeletal: Negative for falls.  Skin: Negative for rash.  Neurological: Positive for dizziness and headaches. Negative for tingling, sensory change, focal weakness, seizures and loss of consciousness.  Endo/Heme/Allergies: Does not bruise/bleed easily.    Objective:  BP (!) 162/80   Pulse 75   Temp 97.9 F (36.6 C)   Ht 5\' 8"  (1.727 m)   Wt 234 lb (106.1 kg)   SpO2 99%   BMI 35.58 kg/m   BP Readings from Last 3 Encounters:  03/05/17 (!) 162/80  02/19/17 (!) 160/80  01/22/17 (!) 146/74    Wt Readings from Last 3 Encounters:  03/05/17 234 lb (106.1 kg)  02/19/17 237 lb (107.5 kg)  01/22/17 240 lb (108.9 kg)    Physical Exam  Constitutional: She is oriented to person, place, and time. No distress.  HENT:  Head: Normocephalic. Head is with contusion. Head is without raccoon's eyes, without Battle's sign, without abrasion, without right periorbital erythema and without left periorbital erythema. Hair is normal.  Right Ear: External ear normal.  Left Ear: External ear normal.  Nose: Nose normal.  Mouth/Throat: Oropharynx is clear and moist. No oropharyngeal exudate.  Eyes: Conjunctivae and EOM are normal. Pupils are equal, round, and reactive to light.  Neck: Normal range of motion. Neck supple.  Cardiovascular: Normal rate, regular rhythm and normal heart sounds.   Pulmonary/Chest: No respiratory distress. She  has wheezes. She has no rales.  Musculoskeletal: She exhibits edema.  Ankle edema (chronic per patient)  Lymphadenopathy:    She has no cervical adenopathy.  Neurological: She is alert and oriented to person, place, and time. No cranial nerve deficit. Coordination normal.  Skin: Skin is warm and dry.  Vitals reviewed.   Lab Results  Component Value Date   WBC 6.5 01/15/2017   HGB 10.8 (L) 01/15/2017   HCT 33.5 (L) 01/15/2017   PLT 324.0 01/15/2017   GLUCOSE 99  01/15/2017   CHOL 217 (H) 01/23/2017   TRIG 148.0 01/23/2017   HDL 72.20 01/23/2017   LDLCALC 115 (H) 01/23/2017   ALT 37 (H) 01/15/2017   AST 44 (H) 01/15/2017   NA 139 01/15/2017   K 4.2 01/15/2017   CL 107 01/15/2017   CREATININE 0.85 01/15/2017   BUN 16 01/15/2017   CO2 27 01/15/2017   TSH 1.05 01/23/2017   HGBA1C 6.6 (H) 01/23/2017    Ct Head Wo Contrast  Result Date: 01/20/2017 CLINICAL DATA:  61 year old female with headaches and blurry vision since 01/06/2017 when she fell in her bathroom and hit the left frontoparietal region on the bathtub EXAM: CT HEAD WITHOUT CONTRAST TECHNIQUE: Contiguous axial images were obtained from the base of the skull through the vertex without intravenous contrast. COMPARISON:  None. FINDINGS: Brain: No evidence of acute infarction, hemorrhage, hydrocephalus, extra-axial collection or mass lesion/mass effect. Mild periventricular, subcortical and deep white matter hypoattenuation which is nonspecific but most suggestive of chronic microvascular ischemic white matter disease. Vascular: No hyperdense vessel or unexpected calcification. Skull: Normal. Negative for acute fracture or focal lesion. Likely remote left nasal bone fracture. Sinuses/Orbits: Moderate mucoperiosteal thickening throughout the ethmoid air cells and right maxillary sinus. Small air-fluid levels are present within the left maxillary sinus, sphenoid sinus and multiple ethmoid sinuses. The mastoid air cells are well aerated bilaterally. Other: None. IMPRESSION: 1. No acute intracranial abnormality. 2. 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 3. Moderate inflammatory paranasal sinus disease. Acute sinusitis is not excluded. 4. Likely remote left nasal bone fracture. Electronically Signed   By: Jacqulynn Cadet M.D.   On: 01/20/2017 15:18    Assessment & Plan:   Stacy Chapman was seen today for follow-up.  Diagnoses and all orders for this visit:  HTN  (hypertension), benign -     amLODipine (NORVASC) 10 MG tablet; Take 1 tablet (10 mg total) by mouth at bedtime. -     lisinopril (PRINIVIL,ZESTRIL) 5 MG tablet; Take 1 tablet (5 mg total) by mouth daily.  Moderate persistent asthma without complication -     cetirizine (ZYRTEC) 10 MG tablet; Take 1 tablet (10 mg total) by mouth daily.   I have discontinued Ms. Duclos's guaiFENesin and fluticasone. I have also changed her amLODipine. Additionally, I am having her start on lisinopril and cetirizine. Lastly, I am having her maintain her albuterol, acetaminophen, aspirin EC, fluticasone-salmeterol, and montelukast.  Meds ordered this encounter  Medications  . amLODipine (NORVASC) 10 MG tablet    Sig: Take 1 tablet (10 mg total) by mouth at bedtime.    Dispense:  90 tablet    Refill:  1    Order Specific Question:   Supervising Provider    Answer:   Cassandria Anger [[id number]]  . lisinopril (PRINIVIL,ZESTRIL) 5 MG tablet    Sig: Take 1 tablet (5 mg total) by mouth daily.    Dispense:  30 tablet    Refill:  3  Order Specific Question:   Supervising Provider    Answer:   Cassandria Anger [[id number]]  . cetirizine (ZYRTEC) 10 MG tablet    Sig: Take 1 tablet (10 mg total) by mouth daily.    Dispense:  30 tablet    Refill:  0    Order Specific Question:   Supervising Provider    Answer:   Cassandria Anger [[id number]]    Follow-up: Return in about 4 weeks (around 04/02/2017) for HTN .  Wilfred Lacy, NP

## 2017-04-06 ENCOUNTER — Ambulatory Visit: Payer: BLUE CROSS/BLUE SHIELD | Admitting: Nurse Practitioner

## 2017-06-04 ENCOUNTER — Ambulatory Visit (INDEPENDENT_AMBULATORY_CARE_PROVIDER_SITE_OTHER): Payer: BLUE CROSS/BLUE SHIELD | Admitting: Nurse Practitioner

## 2017-06-04 ENCOUNTER — Encounter: Payer: Self-pay | Admitting: Nurse Practitioner

## 2017-06-04 VITALS — BP 146/70 | HR 79 | Temp 98.4°F | Ht 68.0 in | Wt 231.0 lb

## 2017-06-04 DIAGNOSIS — I10 Essential (primary) hypertension: Secondary | ICD-10-CM | POA: Diagnosis not present

## 2017-06-04 DIAGNOSIS — J309 Allergic rhinitis, unspecified: Secondary | ICD-10-CM

## 2017-06-04 DIAGNOSIS — J40 Bronchitis, not specified as acute or chronic: Secondary | ICD-10-CM | POA: Diagnosis not present

## 2017-06-04 DIAGNOSIS — R739 Hyperglycemia, unspecified: Secondary | ICD-10-CM | POA: Diagnosis not present

## 2017-06-04 DIAGNOSIS — J324 Chronic pansinusitis: Secondary | ICD-10-CM

## 2017-06-04 LAB — POCT GLYCOSYLATED HEMOGLOBIN (HGB A1C): Hemoglobin A1C: 5.8

## 2017-06-04 MED ORDER — FLUTICASONE-SALMETEROL 115-21 MCG/ACT IN AERO
2.0000 | INHALATION_SPRAY | Freq: Two times a day (BID) | RESPIRATORY_TRACT | 11 refills | Status: DC
Start: 2017-06-04 — End: 2018-06-15

## 2017-06-04 MED ORDER — FLUTICASONE PROPIONATE 50 MCG/ACT NA SUSP: 2.0000 | Freq: Every day | NASAL | 11 refills | Status: DC

## 2017-06-04 MED ORDER — AMLODIPINE BESYLATE 10 MG PO TABS: 10.0000 mg | ORAL_TABLET | Freq: Every day | ORAL | 3 refills | Status: DC

## 2017-06-04 MED ORDER — MONTELUKAST SODIUM 10 MG PO TABS: 10.0000 mg | ORAL_TABLET | Freq: Every day | ORAL | 3 refills | Status: DC

## 2017-06-04 NOTE — Progress Notes (Signed)
Subjective:  Patient ID: Stacy Chapman, female    DOB: Feb 05, 1956  Age: 61 y.o. MRN: [id number]  CC: Follow-up (follow up BP--lisinapril consult?)  HPI  HTN: Improved with amlodipine only. Cough got worse with lisinopril. Cough improved after stopping medication. BP Readings from Last 3 Encounters:  06/04/17 (!) 146/70  03/05/17 (!) 162/80  02/19/17 (!) 160/80   DM: Has made changes to diet and exercise daily. No home glucose check. No medication use at this time.  Bronchitis: Improved with singulair, flonase, advair and albuterol.  Elevated liver enzyme: No ETOH Negative for hepatitis. occassional tylenol use.  Outpatient Medications Prior to Visit  Medication Sig Dispense Refill  . acetaminophen (TYLENOL) 500 MG tablet Take 1 tablet (500 mg total) by mouth every 6 (six) hours as needed. 30 tablet 0  . albuterol (PROVENTIL HFA;VENTOLIN HFA) 108 (90 Base) MCG/ACT inhaler Inhale 2 puffs into the lungs every 6 (six) hours as needed for wheezing or shortness of breath. 1 Inhaler 2  . aspirin EC 81 MG tablet Take 1 tablet (81 mg total) by mouth daily.    . cetirizine (ZYRTEC) 10 MG tablet Take 1 tablet (10 mg total) by mouth daily. 30 tablet 0  . amLODipine (NORVASC) 10 MG tablet Take 1 tablet (10 mg total) by mouth at bedtime. 90 tablet 1  . fluticasone-salmeterol (ADVAIR HFA) 115-21 MCG/ACT inhaler Inhale 2 puffs into the lungs 2 (two) times daily. 1 Inhaler 3  . montelukast (SINGULAIR) 10 MG tablet Take 1 tablet (10 mg total) by mouth at bedtime. 30 tablet 3  . lisinopril (PRINIVIL,ZESTRIL) 5 MG tablet Take 1 tablet (5 mg total) by mouth daily. (Patient not taking: Reported on 06/04/2017) 30 tablet 3   No facility-administered medications prior to visit.     ROS Review of Systems  Respiratory: Negative for cough, sputum production and shortness of breath.   Cardiovascular: Negative for chest pain, palpitations and leg swelling.  Gastrointestinal: Negative for  abdominal pain, constipation, diarrhea, nausea and vomiting.  Skin: Negative.   Neurological: Negative for sensory change, loss of consciousness and headaches.    Objective:  BP (!) 146/70   Pulse 79   Temp 98.4 F (36.9 C)   Ht 5\' 8"  (1.727 m)   Wt 231 lb (104.8 kg)   SpO2 99%   BMI 35.12 kg/m   BP Readings from Last 3 Encounters:  06/04/17 (!) 146/70  03/05/17 (!) 162/80  02/19/17 (!) 160/80    Wt Readings from Last 3 Encounters:  06/04/17 231 lb (104.8 kg)  03/05/17 234 lb (106.1 kg)  02/19/17 237 lb (107.5 kg)    Physical Exam  Constitutional: She is oriented to person, place, and time. No distress.  Neck: Normal range of motion. Neck supple. No thyromegaly present.  Cardiovascular: Normal rate, regular rhythm and normal heart sounds.   Pulmonary/Chest: Effort normal and breath sounds normal.  Lymphadenopathy:    She has no cervical adenopathy.  Neurological: She is alert and oriented to person, place, and time.  Skin: Skin is warm and dry.  Psychiatric: She has a normal mood and affect. Her behavior is normal.  Vitals reviewed.   Lab Results  Component Value Date   WBC 6.5 01/15/2017   HGB 10.8 (L) 01/15/2017   HCT 33.5 (L) 01/15/2017   PLT 324.0 01/15/2017   GLUCOSE 99 01/15/2017   CHOL 217 (H) 01/23/2017   TRIG 148.0 01/23/2017   HDL 72.20 01/23/2017   LDLCALC 115 (H) 01/23/2017  ALT 37 (H) 01/15/2017   AST 44 (H) 01/15/2017   NA 139 01/15/2017   K 4.2 01/15/2017   CL 107 01/15/2017   CREATININE 0.85 01/15/2017   BUN 16 01/15/2017   CO2 27 01/15/2017   TSH 1.05 01/23/2017   HGBA1C 5.8% 06/04/2017    Ct Head Wo Contrast  Result Date: 01/20/2017 CLINICAL DATA:  61 year old female with headaches and blurry vision since 01/06/2017 when she fell in her bathroom and hit the left frontoparietal region on the bathtub EXAM: CT HEAD WITHOUT CONTRAST TECHNIQUE: Contiguous axial images were obtained from the base of the skull through the vertex without  intravenous contrast. COMPARISON:  None. FINDINGS: Brain: No evidence of acute infarction, hemorrhage, hydrocephalus, extra-axial collection or mass lesion/mass effect. Mild periventricular, subcortical and deep white matter hypoattenuation which is nonspecific but most suggestive of chronic microvascular ischemic white matter disease. Vascular: No hyperdense vessel or unexpected calcification. Skull: Normal. Negative for acute fracture or focal lesion. Likely remote left nasal bone fracture. Sinuses/Orbits: Moderate mucoperiosteal thickening throughout the ethmoid air cells and right maxillary sinus. Small air-fluid levels are present within the left maxillary sinus, sphenoid sinus and multiple ethmoid sinuses. The mastoid air cells are well aerated bilaterally. Other: None. IMPRESSION: 1. No acute intracranial abnormality. 2. 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 3. Moderate inflammatory paranasal sinus disease. Acute sinusitis is not excluded. 4. Likely remote left nasal bone fracture. Electronically Signed   By: Jacqulynn Cadet M.D.   On: 01/20/2017 15:18    Assessment & Plan:   Stacy Chapman was seen today for follow-up.  Diagnoses and all orders for this visit:  HTN (hypertension), benign -     amLODipine (NORVASC) 10 MG tablet; Take 1 tablet (10 mg total) by mouth at bedtime.  Hyperglycemia -     POCT glycosylated hemoglobin (Hb A1C)  Bronchitis -     montelukast (SINGULAIR) 10 MG tablet; Take 1 tablet (10 mg total) by mouth at bedtime. -     fluticasone-salmeterol (ADVAIR HFA) 115-21 MCG/ACT inhaler; Inhale 2 puffs into the lungs 2 (two) times daily. Rinse mouth after each use  Allergic rhinitis, unspecified seasonality, unspecified trigger -     fluticasone (FLONASE) 50 MCG/ACT nasal spray; Place 2 sprays into both nostrils daily. -     montelukast (SINGULAIR) 10 MG tablet; Take 1 tablet (10 mg total) by mouth at bedtime.  Chronic pansinusitis -      fluticasone (FLONASE) 50 MCG/ACT nasal spray; Place 2 sprays into both nostrils daily. -     montelukast (SINGULAIR) 10 MG tablet; Take 1 tablet (10 mg total) by mouth at bedtime.   I have changed Ms. Metayer's fluticasone and fluticasone-salmeterol. I am also having her maintain her albuterol, acetaminophen, aspirin EC, lisinopril, cetirizine, amLODipine, and montelukast.  Meds ordered this encounter  Medications  . DISCONTD: fluticasone (FLONASE) 50 MCG/ACT nasal spray    Sig: SHAKE LQ AND U 2 SPRAYS IEN D    Refill:  6  . fluticasone (FLONASE) 50 MCG/ACT nasal spray    Sig: Place 2 sprays into both nostrils daily.    Dispense:  16 g    Refill:  11    Order Specific Question:   Supervising Provider    Answer:   Cassandria Anger [[id number]]  . amLODipine (NORVASC) 10 MG tablet    Sig: Take 1 tablet (10 mg total) by mouth at bedtime.    Dispense:  90 tablet    Refill:  3  Order Specific Question:   Supervising Provider    Answer:   Cassandria Anger [[id number]]  . montelukast (SINGULAIR) 10 MG tablet    Sig: Take 1 tablet (10 mg total) by mouth at bedtime.    Dispense:  90 tablet    Refill:  3    Order Specific Question:   Supervising Provider    Answer:   Cassandria Anger [[id number]]  . fluticasone-salmeterol (ADVAIR HFA) 115-21 MCG/ACT inhaler    Sig: Inhale 2 puffs into the lungs 2 (two) times daily. Rinse mouth after each use    Dispense:  1 Inhaler    Refill:  11    Order Specific Question:   Supervising Provider    Answer:   Cassandria Anger [[id number]]    Follow-up: Return in about 6 months (around 12/05/2017) for DM and HTN.  Wilfred Lacy, NP

## 2017-06-04 NOTE — Patient Instructions (Signed)
Improved HgbA1c from 6.6 to 5.8. No medication needed at this time.  continue heart healthy dieta and daily exercise.  Continue to hold lisinopril, HCTZ and metformin.  Continue taking amlodipine. Check BP once a day and record. Call office if BP >150/80 for more than 2days.   DASH Eating Plan DASH stands for "Dietary Approaches to Stop Hypertension." The DASH eating plan is a healthy eating plan that has been shown to reduce high blood pressure (hypertension). It may also reduce your risk for type 2 diabetes, heart disease, and stroke. The DASH eating plan may also help with weight loss. What are tips for following this plan? General guidelines  Avoid eating more than 2,300 mg (milligrams) of salt (sodium) a day. If you have hypertension, you may need to reduce your sodium intake to 1,500 mg a day.  Limit alcohol intake to no more than 1 drink a day for nonpregnant women and 2 drinks a day for men. One drink equals 12 oz of beer, 5 oz of wine, or 1 oz of hard liquor.  Work with your health care provider to maintain a healthy body weight or to lose weight. Ask what an ideal weight is for you.  Get at least 30 minutes of exercise that causes your heart to beat faster (aerobic exercise) most days of the week. Activities may include walking, swimming, or biking.  Work with your health care provider or diet and nutrition specialist (dietitian) to adjust your eating plan to your individual calorie needs. Reading food labels  Check food labels for the amount of sodium per serving. Choose foods with less than 5 percent of the Daily Value of sodium. Generally, foods with less than 300 mg of sodium per serving fit into this eating plan.  To find whole grains, look for the word "whole" as the first word in the ingredient list. Shopping  Buy products labeled as "low-sodium" or "no salt added."  Buy fresh foods. Avoid canned foods and premade or frozen meals. Cooking  Avoid adding salt  when cooking. Use salt-free seasonings or herbs instead of table salt or sea salt. Check with your health care provider or pharmacist before using salt substitutes.  Do not fry foods. Cook foods using healthy methods such as baking, boiling, grilling, and broiling instead.  Cook with heart-healthy oils, such as olive, canola, soybean, or sunflower oil. Meal planning   Eat a balanced diet that includes: ? 5 or more servings of fruits and vegetables each day. At each meal, try to fill half of your plate with fruits and vegetables. ? Up to 6-8 servings of whole grains each day. ? Less than 6 oz of lean meat, poultry, or fish each day. A 3-oz serving of meat is about the same size as a deck of cards. One egg equals 1 oz. ? 2 servings of low-fat dairy each day. ? A serving of nuts, seeds, or beans 5 times each week. ? Heart-healthy fats. Healthy fats called Omega-3 fatty acids are found in foods such as flaxseeds and coldwater fish, like sardines, salmon, and mackerel.  Limit how much you eat of the following: ? Canned or prepackaged foods. ? Food that is high in trans fat, such as fried foods. ? Food that is high in saturated fat, such as fatty meat. ? Sweets, desserts, sugary drinks, and other foods with added sugar. ? Full-fat dairy products.  Do not salt foods before eating.  Try to eat at least 2 vegetarian meals each week.  Eat more home-cooked food and less restaurant, buffet, and fast food.  When eating at a restaurant, ask that your food be prepared with less salt or no salt, if possible. What foods are recommended? The items listed may not be a complete list. Talk with your dietitian about what dietary choices are best for you. Grains Whole-grain or whole-wheat bread. Whole-grain or whole-wheat pasta. Brown rice. [place]. Bulgur. Whole-grain and low-sodium cereals. Pita bread. Low-fat, low-sodium crackers. Whole-wheat flour tortillas. Vegetables Fresh or frozen  vegetables (raw, steamed, roasted, or grilled). Low-sodium or reduced-sodium tomato and vegetable juice. Low-sodium or reduced-sodium tomato sauce and tomato paste. Low-sodium or reduced-sodium canned vegetables. Fruits All fresh, dried, or frozen fruit. Canned fruit in natural juice (without added sugar). Meat and other protein foods Skinless chicken or [place]. Ground chicken or [place]. Pork with fat trimmed off. Fish and seafood. Egg whites. Dried beans, peas, or lentils. Unsalted nuts, nut butters, and seeds. Unsalted canned beans. Lean cuts of beef with fat trimmed off. Low-sodium, lean deli meat. Dairy Low-fat (1%) or fat-free (skim) milk. Fat-free, low-fat, or reduced-fat cheeses. Nonfat, low-sodium ricotta or cottage cheese. Low-fat or nonfat yogurt. Low-fat, low-sodium cheese. Fats and oils Soft margarine without trans fats. Vegetable oil. Low-fat, reduced-fat, or light mayonnaise and salad dressings (reduced-sodium). Canola, safflower, olive, soybean, and sunflower oils. Avocado. Seasoning and other foods Herbs. Spices. Seasoning mixes without salt. Unsalted popcorn and pretzels. Fat-free sweets. What foods are not recommended? The items listed may not be a complete list. Talk with your dietitian about what dietary choices are best for you. Grains Baked goods made with fat, such as croissants, muffins, or some breads. Dry pasta or rice meal packs. Vegetables Creamed or fried vegetables. Vegetables in a cheese sauce. Regular canned vegetables (not low-sodium or reduced-sodium). Regular canned tomato sauce and paste (not low-sodium or reduced-sodium). Regular tomato and vegetable juice (not low-sodium or reduced-sodium). Angie Fava. Olives. Fruits Canned fruit in a light or heavy syrup. Fried fruit. Fruit in cream or butter sauce. Meat and other protein foods Fatty cuts of meat. Ribs. Fried meat. Berniece Salines. Sausage. Bologna and other processed lunch meats. Salami. Fatback. Hotdogs. Bratwurst.  Salted nuts and seeds. Canned beans with added salt. Canned or smoked fish. Whole eggs or egg yolks. Chicken or [place] with skin. Dairy Whole or 2% milk, cream, and half-and-half. Whole or full-fat cream cheese. Whole-fat or sweetened yogurt. Full-fat cheese. Nondairy creamers. Whipped toppings. Processed cheese and cheese spreads. Fats and oils Butter. Stick margarine. Lard. Shortening. Ghee. Bacon fat. Tropical oils, such as coconut, palm kernel, or palm oil. Seasoning and other foods Salted popcorn and pretzels. Onion salt, garlic salt, seasoned salt, table salt, and sea salt. Worcestershire sauce. Tartar sauce. Barbecue sauce. Teriyaki sauce. Soy sauce, including reduced-sodium. Steak sauce. Canned and packaged gravies. Fish sauce. Oyster sauce. Cocktail sauce. Horseradish that you find on the shelf. Ketchup. Mustard. Meat flavorings and tenderizers. Bouillon cubes. Hot sauce and Tabasco sauce. Premade or packaged marinades. Premade or packaged taco seasonings. Relishes. Regular salad dressings. Where to find more information:  National Heart, Lung, and [place]: https://wilson-eaton.com/  American Heart Association: www.heart.org Summary  The DASH eating plan is a healthy eating plan that has been shown to reduce high blood pressure (hypertension). It may also reduce your risk for type 2 diabetes, heart disease, and stroke.  With the DASH eating plan, you should limit salt (sodium) intake to 2,300 mg a day. If you have hypertension, you may need to reduce your sodium intake  to 1,500 mg a day.  When on the DASH eating plan, aim to eat more fresh fruits and vegetables, whole grains, lean proteins, low-fat dairy, and heart-healthy fats.  Work with your health care provider or diet and nutrition specialist (dietitian) to adjust your eating plan to your individual calorie needs. This information is not intended to replace advice given to you by your health care provider. Make sure you discuss any  questions you have with your health care provider. Document Released: 09/18/2011 Document Revised: 09/22/2016 Document Reviewed: 09/22/2016 Elsevier Interactive Patient Education  2017 [employer].

## 2018-02-20 IMAGING — DX DG CERVICAL SPINE COMPLETE 4+V
5 series · 5 of 5 positions shown · non-contrast
Comparison: None.

CLINICAL DATA: Fell 10 days ago with persistent neck pain

EXAM:
CERVICAL SPINE - COMPLETE 4+ VIEW

[c-spine lat]
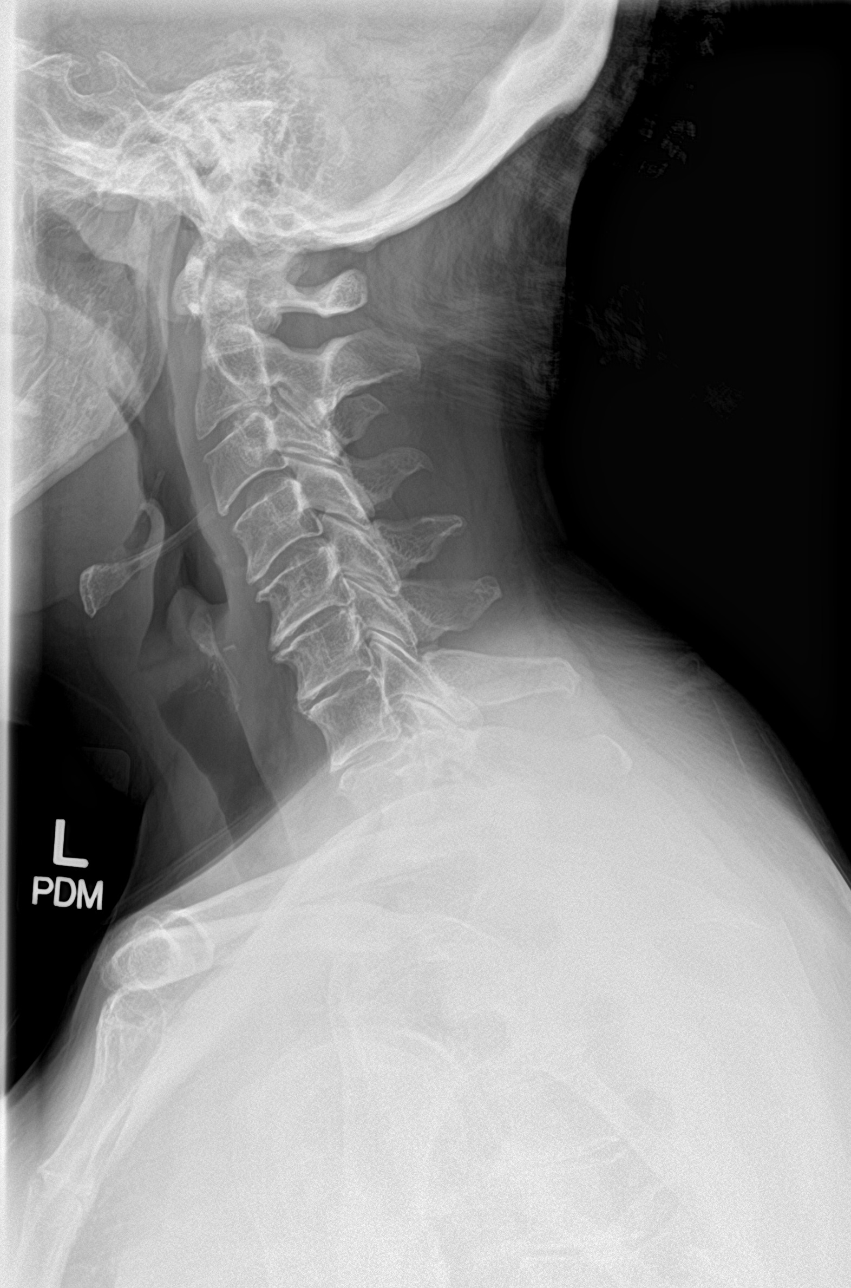

[c-spine obl (1 of 2)]
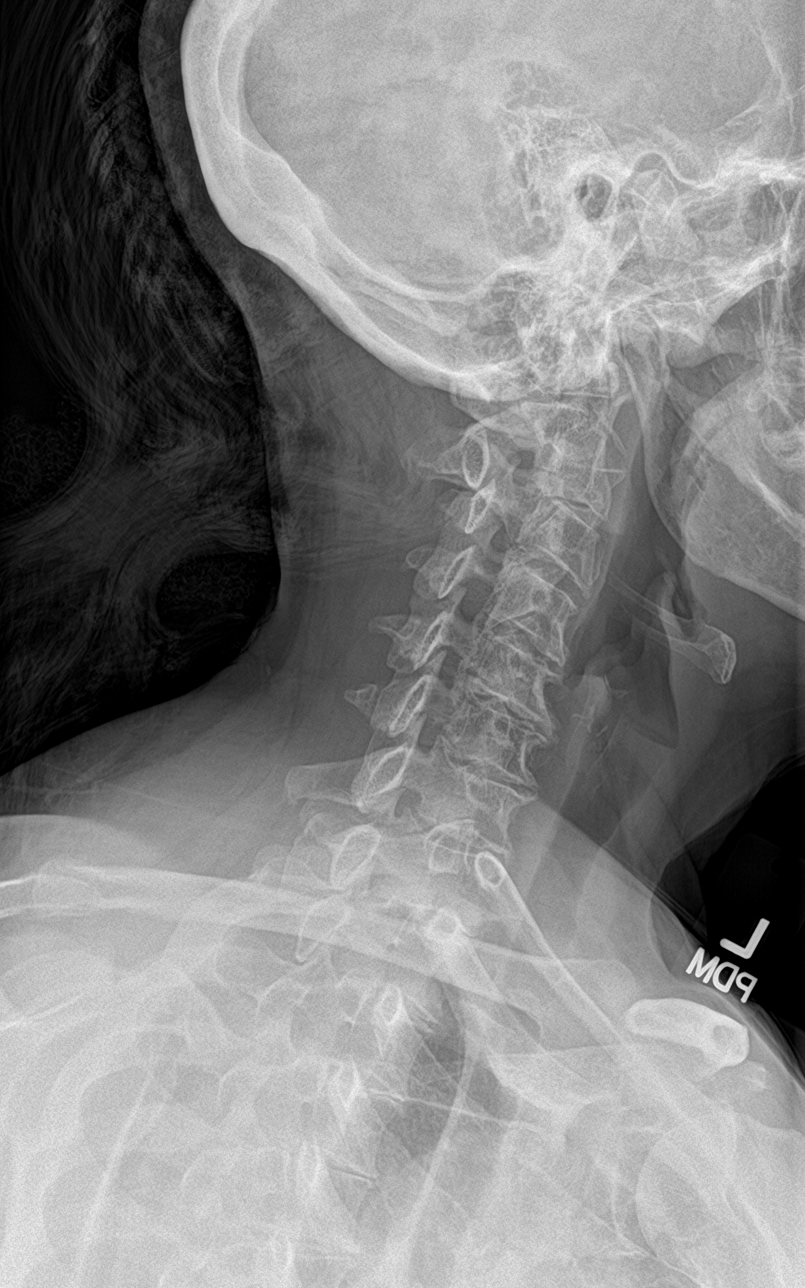

[c-spine obl (2 of 2)]
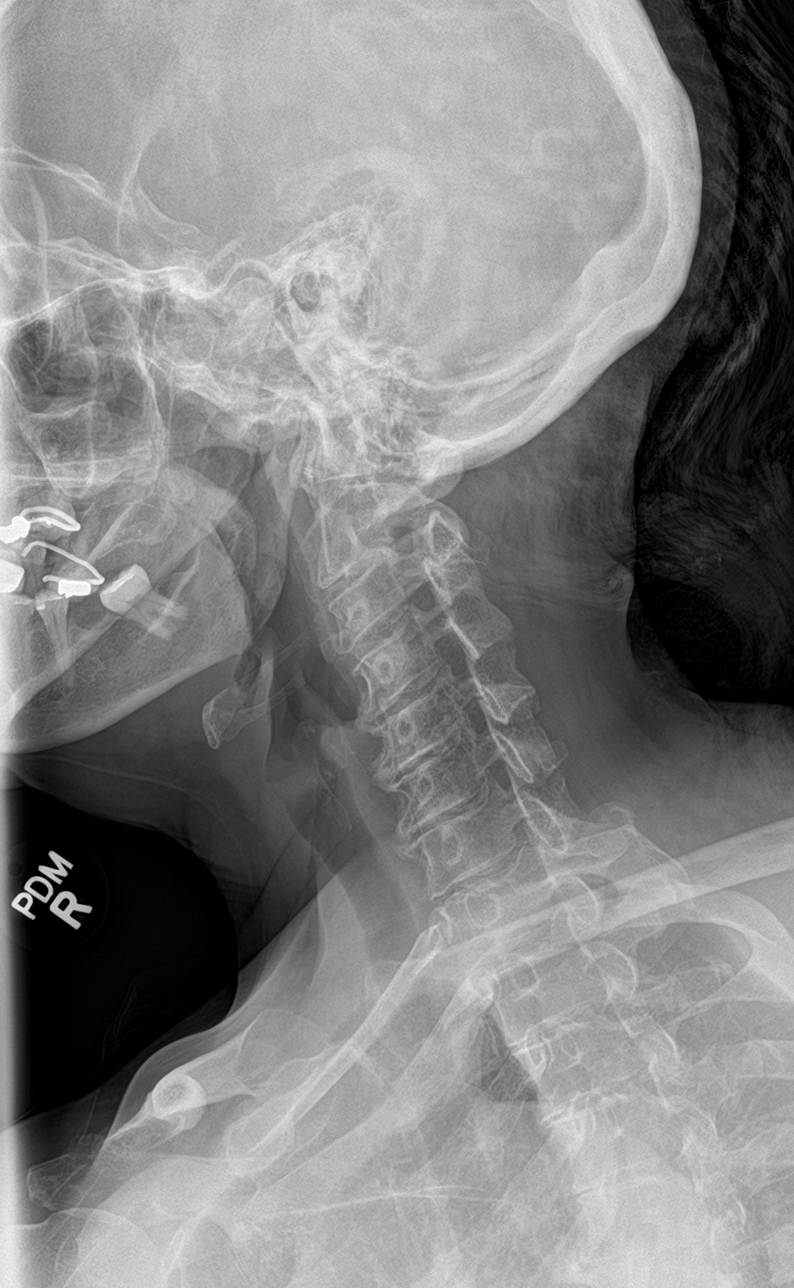

[c-spine ap]
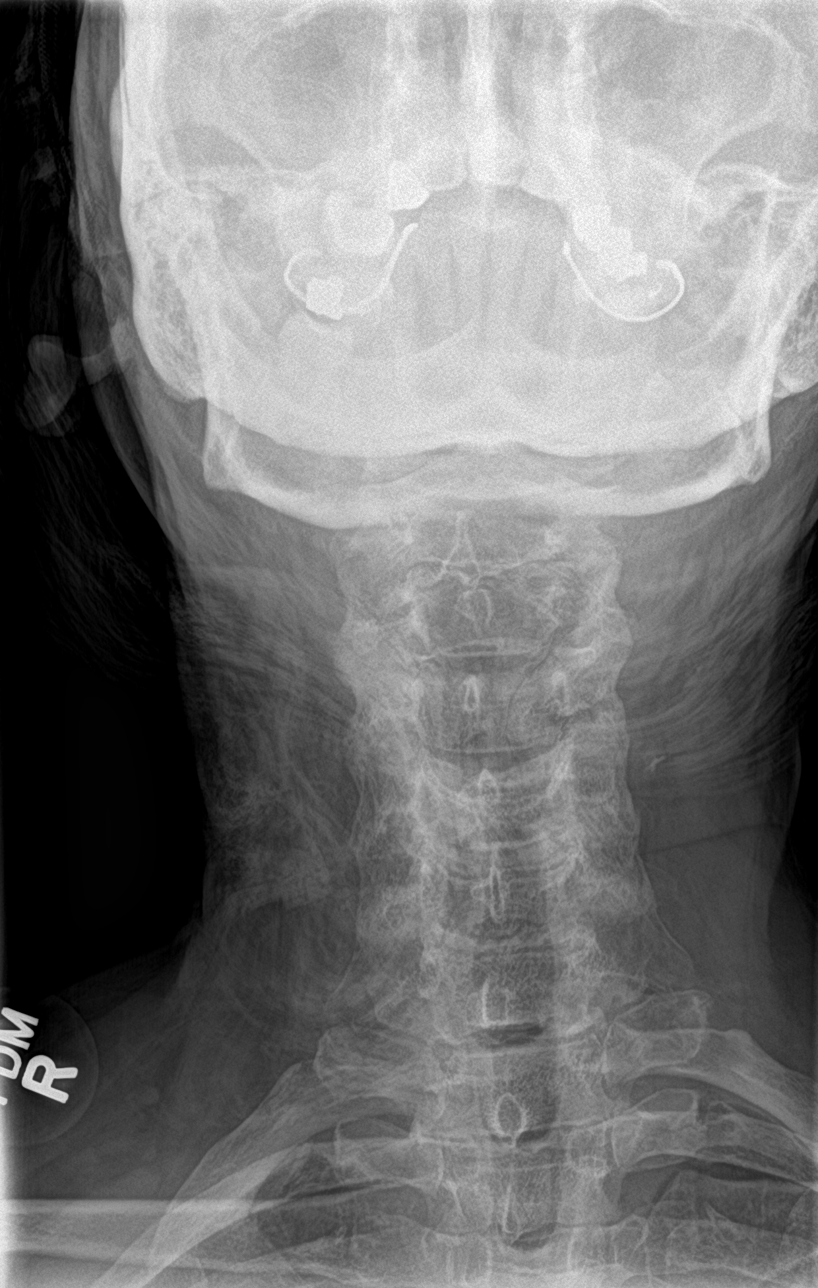

[c-spine open mouth]
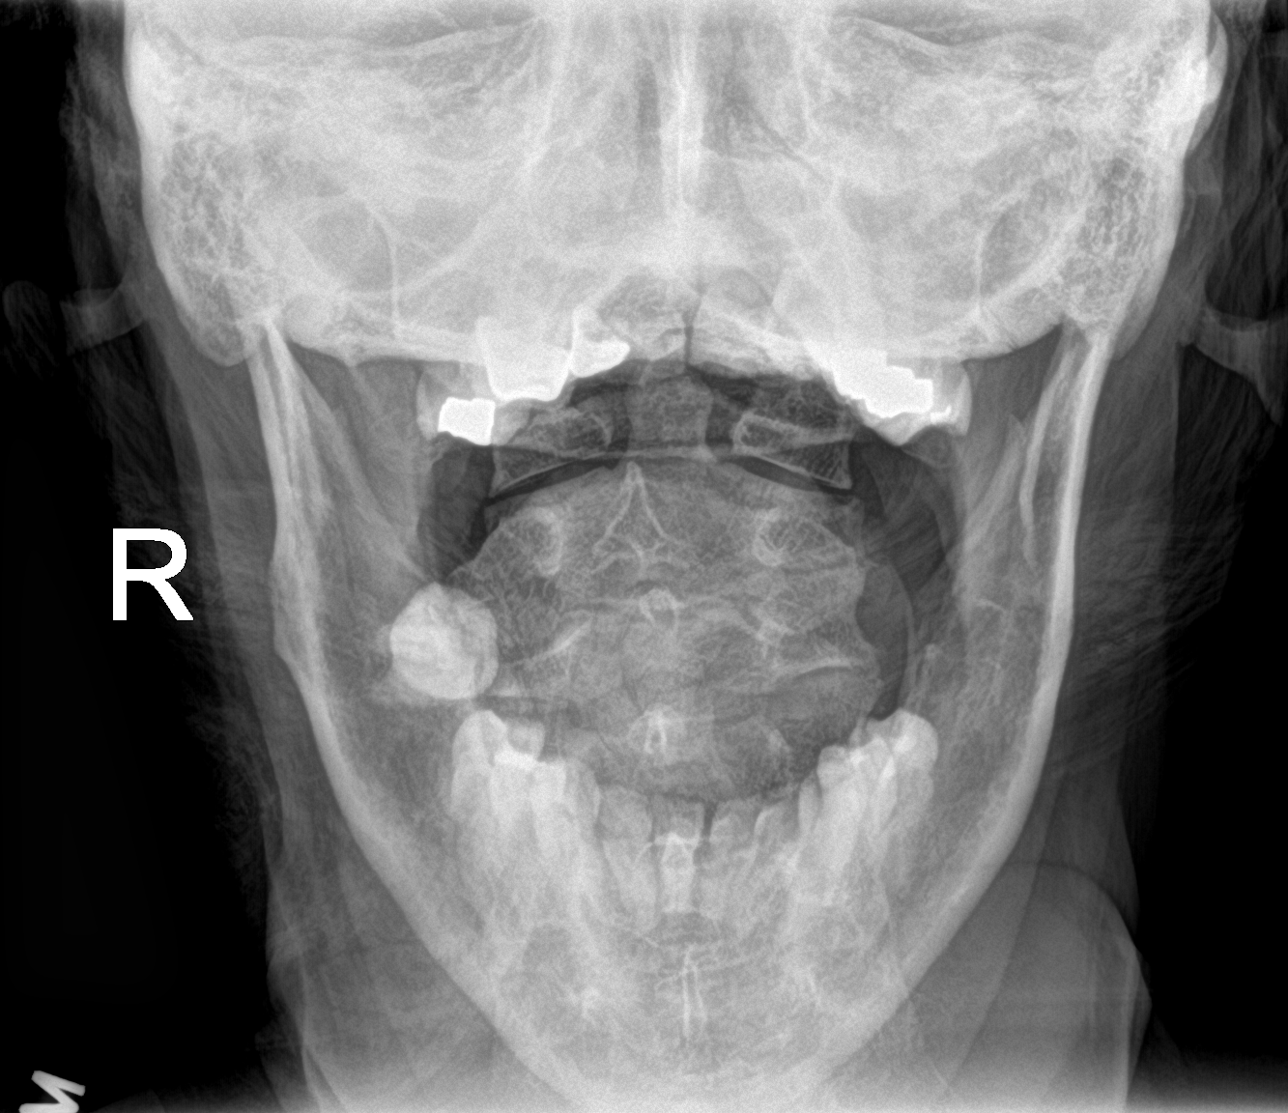

[5 of 5 positions shown; findings below may reference images not displayed]

FINDINGS: The cervical vertebrae are straightened in alignment. There is
degenerative disc disease particularly at C5-6 and C6-7 and also at
C7-T1. At these levels there is loss of intervertebral disc space
with sclerosis and spurring present. No prevertebral soft tissue
swelling is seen. There is some foraminal narrowing bilaterally at
C5-6 and C6-7 levels. The odontoid process is intact. The lung
apices appear clear.
IMPRESSION: 1. Straightened alignment of the cervical vertebrae.
2. Degenerative disc disease particularly at C5-6 and C6-7 with some
foraminal narrowing at these levels.

## 2018-06-09 ENCOUNTER — Other Ambulatory Visit: Payer: Self-pay | Admitting: Nurse Practitioner

## 2018-06-09 DIAGNOSIS — I10 Essential (primary) hypertension: Secondary | ICD-10-CM

## 2018-06-09 DIAGNOSIS — J324 Chronic pansinusitis: Secondary | ICD-10-CM

## 2018-06-09 DIAGNOSIS — J40 Bronchitis, not specified as acute or chronic: Secondary | ICD-10-CM

## 2018-06-09 DIAGNOSIS — J309 Allergic rhinitis, unspecified: Secondary | ICD-10-CM

## 2018-06-11 ENCOUNTER — Ambulatory Visit: Payer: BLUE CROSS/BLUE SHIELD | Admitting: Nurse Practitioner

## 2018-06-15 ENCOUNTER — Encounter: Payer: Self-pay | Admitting: Nurse Practitioner

## 2018-06-15 ENCOUNTER — Ambulatory Visit: Payer: BLUE CROSS/BLUE SHIELD | Admitting: Nurse Practitioner

## 2018-06-15 VITALS — BP 124/82 | HR 124 | Temp 98.4°F | Ht 68.0 in | Wt 240.0 lb

## 2018-06-15 DIAGNOSIS — E119 Type 2 diabetes mellitus without complications: Secondary | ICD-10-CM | POA: Diagnosis not present

## 2018-06-15 DIAGNOSIS — I4891 Unspecified atrial fibrillation: Secondary | ICD-10-CM | POA: Diagnosis not present

## 2018-06-15 DIAGNOSIS — Z1231 Encounter for screening mammogram for malignant neoplasm of breast: Secondary | ICD-10-CM

## 2018-06-15 DIAGNOSIS — J4521 Mild intermittent asthma with (acute) exacerbation: Secondary | ICD-10-CM | POA: Diagnosis not present

## 2018-06-15 DIAGNOSIS — I10 Essential (primary) hypertension: Secondary | ICD-10-CM

## 2018-06-15 DIAGNOSIS — R748 Abnormal levels of other serum enzymes: Secondary | ICD-10-CM

## 2018-06-15 DIAGNOSIS — Z1211 Encounter for screening for malignant neoplasm of colon: Secondary | ICD-10-CM

## 2018-06-15 DIAGNOSIS — E782 Mixed hyperlipidemia: Secondary | ICD-10-CM

## 2018-06-15 DIAGNOSIS — Z1239 Encounter for other screening for malignant neoplasm of breast: Secondary | ICD-10-CM

## 2018-06-15 LAB — CBC
HCT: 35.2 % — ABNORMAL LOW (ref 36.0–46.0)
Hemoglobin: 11.4 g/dL — ABNORMAL LOW (ref 12.0–15.0)
MCHC: 32.4 g/dL (ref 30.0–36.0)
MCV: 84.6 fl (ref 78.0–100.0)
PLATELETS: 297 10*3/uL (ref 150.0–400.0)
RBC: 4.16 Mil/uL (ref 3.87–5.11)
RDW: 14 % (ref 11.5–15.5)
WBC: 6.6 10*3/uL (ref 4.0–10.5)

## 2018-06-15 LAB — BASIC METABOLIC PANEL
BUN: 20 mg/dL (ref 6–23)
CHLORIDE: 103 meq/L (ref 96–112)
CO2: 22 meq/L (ref 19–32)
Calcium: 9.7 mg/dL (ref 8.4–10.5)
Creatinine, Ser: 0.84 mg/dL (ref 0.40–1.20)
GFR: 88.43 mL/min (ref >60.00)
GLUCOSE: 88 mg/dL (ref 70–99)
POTASSIUM: 3.9 meq/L (ref 3.5–5.1)
SODIUM: 135 meq/L (ref 135–145)

## 2018-06-15 LAB — LIPID PANEL
Cholesterol: 192 mg/dL (ref 0–200)
HDL: 60.3 mg/dL (ref >39.00)
LDL Cholesterol: 101 mg/dL — ABNORMAL HIGH (ref 0–99)
NONHDL: 131.58
Total CHOL/HDL Ratio: 3
Triglycerides: 154 mg/dL — ABNORMAL HIGH (ref 0.0–149.0)
VLDL: 30.8 mg/dL (ref 0.0–40.0)

## 2018-06-15 LAB — HEPATIC FUNCTION PANEL
ALK PHOS: 60 U/L (ref 39–117)
ALT: 12 U/L (ref 0–35)
AST: 17 U/L (ref 0–37)
Albumin: 4.1 g/dL (ref 3.5–5.2)
Bilirubin, Direct: 0.1 mg/dL (ref 0.0–0.3)
TOTAL PROTEIN: 8.7 g/dL — AB (ref 6.0–8.3)
Total Bilirubin: 0.4 mg/dL (ref 0.2–1.2)

## 2018-06-15 LAB — MICROALBUMIN / CREATININE URINE RATIO
CREATININE, U: 43.2 mg/dL
MICROALB/CREAT RATIO: 1.6 mg/g (ref 0.0–30.0)
Microalb, Ur: <0.7 mg/dL (ref 0.0–1.9)

## 2018-06-15 LAB — HEMOGLOBIN A1C: Hgb A1c MFr Bld: 6.3 % (ref 4.6–6.5)

## 2018-06-15 LAB — TSH: TSH: 0.99 u[IU]/mL (ref 0.35–4.50)

## 2018-06-15 MED ORDER — ALBUTEROL SULFATE (2.5 MG/3ML) 0.083% IN NEBU
2.5000 mg | INHALATION_SOLUTION | Freq: Once | RESPIRATORY_TRACT | Status: AC
  Administered 2018-06-15: 2.5 mg via RESPIRATORY_TRACT

## 2018-06-15 MED ORDER — MONTELUKAST SODIUM 10 MG PO TABS: 10.0000 mg | ORAL_TABLET | Freq: Every day | ORAL | 3 refills | Status: DC

## 2018-06-15 MED ORDER — FLUTICASONE-SALMETEROL 115-21 MCG/ACT IN AERO: 2.0000 | INHALATION_SPRAY | Freq: Two times a day (BID) | RESPIRATORY_TRACT | 11 refills | Status: DC

## 2018-06-15 MED ORDER — ALBUTEROL SULFATE HFA 108 (90 BASE) MCG/ACT IN AERS: 1.0000 | INHALATION_SPRAY | Freq: Four times a day (QID) | RESPIRATORY_TRACT | 3 refills | Status: DC | PRN

## 2018-06-15 MED ORDER — BENZONATATE 100 MG PO CAPS: 100.0000 mg | ORAL_CAPSULE | Freq: Three times a day (TID) | ORAL | 0 refills | Status: DC | PRN

## 2018-06-15 MED ORDER — AMLODIPINE BESYLATE 10 MG PO TABS: 10.0000 mg | ORAL_TABLET | Freq: Every day | ORAL | 3 refills | Status: DC

## 2018-06-15 MED ORDER — GUAIFENESIN ER 600 MG PO TB12: 600.0000 mg | ORAL_TABLET | Freq: Two times a day (BID) | ORAL | 0 refills | Status: DC | PRN

## 2018-06-15 MED ORDER — CEFDINIR 300 MG PO CAPS: 300.0000 mg | ORAL_CAPSULE | Freq: Two times a day (BID) | ORAL | 0 refills | Status: DC

## 2018-06-15 NOTE — Progress Notes (Signed)
Subjective:  Patient ID: Stacy Chapman, female    DOB: 05/04/1956  Age: 61 y.o. MRN: [id number]  CC: Follow-up (6 mo fu/DM and HTN. meds refill) and Cough (coughing green mucus,chills,sore throat. 4 days. )  Cough  This is a recurrent problem. The current episode started in the past 7 days. The problem has been unchanged. The cough is productive of purulent sputum. Associated symptoms include chills, nasal congestion, postnasal drip, rhinorrhea and wheezing. The symptoms are aggravated by lying down and cold air. She has tried a beta-agonist inhaler and OTC cough suppressant for the symptoms. Her past medical history is significant for asthma and environmental allergies.  needs advair refill Use of proair 1-2times a week.  HTN: Controlled with amlodipine BP Readings from Last 3 Encounters:  06/15/18 124/82  06/04/17 (!) 146/70  03/05/17 (!) 162/80  Tachycardia noted during office visit today.  DM: Last hgbA1c 6.6 Diet controlled Needs to schedule eye exam  Reviewed past Medical, Social and Family history today.  Outpatient Medications Prior to Visit  Medication Sig Dispense Refill  . acetaminophen (TYLENOL) 500 MG tablet Take 1 tablet (500 mg total) by mouth every 6 (six) hours as needed. 30 tablet 0  . cetirizine (ZYRTEC) 10 MG tablet Take 1 tablet (10 mg total) by mouth daily. 30 tablet 0  . fluticasone (FLONASE) 50 MCG/ACT nasal spray Place 2 sprays into both nostrils daily. 16 g 11  . albuterol (PROVENTIL HFA;VENTOLIN HFA) 108 (90 Base) MCG/ACT inhaler Inhale 2 puffs into the lungs every 6 (six) hours as needed for wheezing or shortness of breath. 1 Inhaler 2  . amLODipine (NORVASC) 10 MG tablet Take 1 tablet (10 mg total) by mouth at bedtime. 90 tablet 3  . aspirin EC 81 MG tablet Take 1 tablet (81 mg total) by mouth daily.    . fluticasone-salmeterol (ADVAIR HFA) 115-21 MCG/ACT inhaler Inhale 2 puffs into the lungs 2 (two) times daily. Rinse mouth after each use 1  Inhaler 11  . montelukast (SINGULAIR) 10 MG tablet Take 1 tablet (10 mg total) by mouth at bedtime. 90 tablet 3   No facility-administered medications prior to visit.     ROS Review of Systems  Constitutional: Positive for chills. Negative for malaise/fatigue.  HENT: Positive for postnasal drip and rhinorrhea.   Respiratory: Positive for sputum production and wheezing.   Cardiovascular: Negative for orthopnea and leg swelling.  Neurological: Negative for dizziness and sensory change.  Endo/Heme/Allergies: Positive for environmental allergies.  Psychiatric/Behavioral: The patient is not nervous/anxious.    Objective:  BP 124/82   Pulse (!) 124   Temp 98.4 F (36.9 C) (Oral)   Ht 5\' 8"  (1.727 m)   Wt 240 lb (108.9 kg)   SpO2 99%   BMI 36.49 kg/m   BP Readings from Last 3 Encounters:  06/15/18 124/82  06/04/17 (!) 146/70  03/05/17 (!) 162/80    Wt Readings from Last 3 Encounters:  06/15/18 240 lb (108.9 kg)  06/04/17 231 lb (104.8 kg)  03/05/17 234 lb (106.1 kg)    Physical Exam  Constitutional: She is oriented to person, place, and time.  Neck: No JVD present.  Cardiovascular: Normal heart sounds and intact distal pulses. An irregularly irregular rhythm present.  Pulmonary/Chest: Effort normal. She has wheezes. She has no rales.  Musculoskeletal: She exhibits no edema.  Neurological: She is alert and oriented to person, place, and time.  Psychiatric: She has a normal mood and affect. Her behavior is normal. Thought content  normal.  Vitals reviewed.   Lab Results  Component Value Date   WBC 6.6 06/15/2018   HGB 11.4 (L) 06/15/2018   HCT 35.2 (L) 06/15/2018   PLT 297.0 06/15/2018   GLUCOSE 88 06/15/2018   CHOL 192 06/15/2018   TRIG 154.0 (H) 06/15/2018   HDL 60.30 06/15/2018   LDLCALC 101 (H) 06/15/2018   ALT 12 06/15/2018   AST 17 06/15/2018   NA 135 06/15/2018   K 3.9 06/15/2018   CL 103 06/15/2018   CREATININE 0.84 06/15/2018   BUN 20 06/15/2018    CO2 22 06/15/2018   TSH 0.99 06/15/2018   HGBA1C 6.3 06/15/2018   MICROALBUR <0.7 06/15/2018    Ct Head Wo Contrast  Result Date: 01/20/2017 CLINICAL DATA:  62 year old female with headaches and blurry vision since 01/06/2017 when she fell in her bathroom and hit the left frontoparietal region on the bathtub EXAM: CT HEAD WITHOUT CONTRAST TECHNIQUE: Contiguous axial images were obtained from the base of the skull through the vertex without intravenous contrast. COMPARISON:  None. FINDINGS: Brain: No evidence of acute infarction, hemorrhage, hydrocephalus, extra-axial collection or mass lesion/mass effect. Mild periventricular, subcortical and deep white matter hypoattenuation which is nonspecific but most suggestive of chronic microvascular ischemic white matter disease. Vascular: No hyperdense vessel or unexpected calcification. Skull: Normal. Negative for acute fracture or focal lesion. Likely remote left nasal bone fracture. Sinuses/Orbits: Moderate mucoperiosteal thickening throughout the ethmoid air cells and right maxillary sinus. Small air-fluid levels are present within the left maxillary sinus, sphenoid sinus and multiple ethmoid sinuses. The mastoid air cells are well aerated bilaterally. Other: None. IMPRESSION: 1. No acute intracranial abnormality. 2. 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 3. Moderate inflammatory paranasal sinus disease. Acute sinusitis is not excluded. 4. Likely remote left nasal bone fracture. Electronically Signed   By: Stacy Chapman M.D.   On: 01/20/2017 15:18   ECG: New onset of Atrial fibrillation, rate 116.  Assessment & Plan:   Stacy Chapman was seen today for follow-up and cough.  Diagnoses and all orders for this visit:  HTN (hypertension), benign -     Basic metabolic panel -     amLODipine (NORVASC) 10 MG tablet; Take 1 tablet (10 mg total) by mouth at bedtime.  Type 2 diabetes mellitus without complication, without long-term  current use of insulin (HCC) -     Basic metabolic panel -     Hemoglobin A1c -     Microalbumin / creatinine urine ratio  Mixed hyperlipidemia -     Lipid Profile  Elevated liver enzymes -     Hepatic function panel  Colon cancer screening -     Ambulatory referral to Gastroenterology  Breast cancer screening -     MM 3D SCREEN BREAST BILATERAL; Future  Mild intermittent asthma with acute exacerbation -     albuterol (PROVENTIL) (2.5 MG/3ML) 0.083% nebulizer solution 2.5 mg -     fluticasone-salmeterol (ADVAIR HFA) 115-21 MCG/ACT inhaler; Inhale 2 puffs into the lungs 2 (two) times daily. Rinse mouth after each use -     albuterol (PROVENTIL HFA;VENTOLIN HFA) 108 (90 Base) MCG/ACT inhaler; Inhale 1-2 puffs into the lungs every 6 (six) hours as needed for wheezing or shortness of breath. -     montelukast (SINGULAIR) 10 MG tablet; Take 1 tablet (10 mg total) by mouth at bedtime. -     benzonatate (TESSALON) 100 MG capsule; Take 1 capsule (100 mg total) by mouth 3 (three) times  daily as needed for cough. -     guaiFENesin (MUCINEX) 600 MG 12 hr tablet; Take 1 tablet (600 mg total) by mouth 2 (two) times daily as needed for cough or to loosen phlegm. -     cefdinir (OMNICEF) 300 MG capsule; Take 1 capsule (300 mg total) by mouth 2 (two) times daily.  Atrial fibrillation, unspecified type ([place]) -     EKG 12-Lead -     TSH -     CBC -     Ambulatory referral to Cardiology -     rivaroxaban (XARELTO) 20 MG TABS tablet; Take 1 tablet (20 mg total) by mouth daily with supper.   I have discontinued Vearl Aitken. [place]'s aspirin EC. I have also changed her albuterol. Additionally, I am having her start on benzonatate, guaiFENesin, cefdinir, and rivaroxaban. Lastly, I am having her maintain her acetaminophen, cetirizine, fluticasone, fluticasone-salmeterol, amLODipine, and montelukast. We administered albuterol.  Meds ordered this encounter  Medications  . albuterol (PROVENTIL) (2.5  MG/3ML) 0.083% nebulizer solution 2.5 mg  . fluticasone-salmeterol (ADVAIR HFA) 115-21 MCG/ACT inhaler    Sig: Inhale 2 puffs into the lungs 2 (two) times daily. Rinse mouth after each use    Dispense:  1 Inhaler    Refill:  11    Order Specific Question:   Supervising Provider    Answer:   Lucille Passy [[id number]]  . albuterol (PROVENTIL HFA;VENTOLIN HFA) 108 (90 Base) MCG/ACT inhaler    Sig: Inhale 1-2 puffs into the lungs every 6 (six) hours as needed for wheezing or shortness of breath.    Dispense:  1 Inhaler    Refill:  3    Order Specific Question:   Supervising Provider    Answer:   Lucille Passy [[id number]]  . amLODipine (NORVASC) 10 MG tablet    Sig: Take 1 tablet (10 mg total) by mouth at bedtime.    Dispense:  90 tablet    Refill:  3    Order Specific Question:   Supervising Provider    Answer:   Lucille Passy [[id number]]  . montelukast (SINGULAIR) 10 MG tablet    Sig: Take 1 tablet (10 mg total) by mouth at bedtime.    Dispense:  90 tablet    Refill:  3    Order Specific Question:   Supervising Provider    Answer:   Lucille Passy [[id number]]  . benzonatate (TESSALON) 100 MG capsule    Sig: Take 1 capsule (100 mg total) by mouth 3 (three) times daily as needed for cough.    Dispense:  20 capsule    Refill:  0    Order Specific Question:   Supervising Provider    Answer:   Lucille Passy [[id number]]  . guaiFENesin (MUCINEX) 600 MG 12 hr tablet    Sig: Take 1 tablet (600 mg total) by mouth 2 (two) times daily as needed for cough or to loosen phlegm.    Dispense:  14 tablet    Refill:  0    Order Specific Question:   Supervising Provider    Answer:   Lucille Passy [[id number]]  . cefdinir (OMNICEF) 300 MG capsule    Sig: Take 1 capsule (300 mg total) by mouth 2 (two) times daily.    Dispense:  14 capsule    Refill:  0    Order Specific Question:   Supervising Provider    Answer:   Lucille Passy [[id number]]  . rivaroxaban (XARELTO) 20  MG TABS tablet    Sig: Take 1 tablet (20 mg total) by mouth daily  with supper.    Dispense:  30 tablet    Refill:  1    Order Specific Question:   Supervising Provider    Answer:   Lucille Passy [[id number]]    Follow-up: Return in about 2 weeks (around 06/29/2018) for Atrial fibrillation.  Wilfred Lacy, NP

## 2018-06-15 NOTE — Patient Instructions (Addendum)
Stable DM with normal urine microalbumin. Normal TSH. Stable CBC, BMP and hepatic panel. Lipid panel indicates elevated triglyceride and LDL. In presence of DM and HTN, you have a 47yrs risk of cardiovascular event at 5.76%. Since this is <7%, no medication is needed at this time. Maintain DASH diet. Xarelto (anticoagulant) sent. F/up with me in 2weeks if you have not seen cardiology.  Schedule appt for diabetic eye exam.  You will be contacted to schedule appt for mammogram and colonoscopy.   You will be contacted to schedule appt with cardiology.  Atrial Fibrillation Atrial fibrillation is a type of irregular or rapid heartbeat (arrhythmia). In atrial fibrillation, the heart quivers continuously in a chaotic pattern. This occurs when parts of the heart receive disorganized signals that make the heart unable to pump blood normally. This can increase the risk for stroke, heart failure, and other heart-related conditions. There are different types of atrial fibrillation, including:  Paroxysmal atrial fibrillation. This type starts suddenly, and it usually stops on its own shortly after it starts.  Persistent atrial fibrillation. This type often lasts longer than a week. It may stop on its own or with treatment.  Long-lasting persistent atrial fibrillation. This type lasts longer than 12 months.  Permanent atrial fibrillation. This type does not go away.  Talk with your health care provider to learn about the type of atrial fibrillation that you have. What are the causes? This condition is caused by some heart-related conditions or procedures, including:  A heart attack.  Coronary artery disease.  Heart failure.  Heart valve conditions.  High blood pressure.  Inflammation of the sac that surrounds the heart (pericarditis).  Heart surgery.  Certain heart rhythm disorders, such as Wolf-Parkinson-White syndrome.  Other causes include:  Pneumonia.  Obstructive sleep  apnea.  Blockage of an artery in the lungs (pulmonary embolism, or PE).  Lung cancer.  Chronic lung disease.  Thyroid problems, especially if the thyroid is overactive (hyperthyroidism).  Caffeine.  Excessive alcohol use or illegal drug use.  Use of some medicines, including certain decongestants and diet pills.  Sometimes, the cause cannot be found. What increases the risk? This condition is more likely to develop in:  People who are older in age.  People who smoke.  People who have diabetes mellitus.  People who are overweight (obese).  Athletes who exercise vigorously.  What are the signs or symptoms? Symptoms of this condition include:  A feeling that your heart is beating rapidly or irregularly.  A feeling of discomfort or pain in your chest.  Shortness of breath.  Sudden light-headedness or weakness.  Getting tired easily during exercise.  In some cases, there are no symptoms. How is this diagnosed? Your health care provider may be able to detect atrial fibrillation when taking your pulse. If detected, this condition may be diagnosed with:  An electrocardiogram (ECG).  A Holter monitor test that records your heartbeat patterns over a 24-hour period.  Transthoracic echocardiogram (TTE) to evaluate how blood flows through your heart.  Transesophageal echocardiogram (TEE) to view more detailed images of your heart.  A stress test.  Imaging tests, such as a CT scan or chest X-ray.  Blood tests.  How is this treated? The main goals of treatment are to prevent blood clots from forming and to keep your heart beating at a normal rate and rhythm. The type of treatment that you receive depends on many factors, such as your underlying medical conditions and how you feel when you are  experiencing atrial fibrillation. This condition may be treated with:  Medicine to slow down the heart rate, bring the heart's rhythm back to normal, or prevent clots from  forming.  Electrical cardioversion. This is a procedure that resets your heart's rhythm by delivering a controlled, low-energy shock to the heart through your skin.  Different types of ablation, such as catheter ablation, catheter ablation with pacemaker, or surgical ablation. These procedures destroy the heart tissues that send abnormal signals. When the pacemaker is used, it is placed under your skin to help your heart beat in a regular rhythm.  Follow these instructions at home:  Take over-the counter and prescription medicines only as told by your health care provider.  If your health care provider prescribed a blood-thinning medicine (anticoagulant), take it exactly as told. Taking too much blood-thinning medicine can cause bleeding. If you do not take enough blood-thinning medicine, you will not have the protection that you need against stroke and other problems.  Do not use tobacco products, including cigarettes, chewing tobacco, and e-cigarettes. If you need help quitting, ask your health care provider.  If you have obstructive sleep apnea, manage your condition as told by your health care provider.  Do not drink alcohol.  Do not drink beverages that contain caffeine, such as coffee, soda, and tea.  Maintain a healthy weight. Do not use diet pills unless your health care provider approves. Diet pills may make heart problems worse.  Follow diet instructions as told by your health care provider.  Exercise regularly as told by your health care provider.  Keep all follow-up visits as told by your health care provider. This is important. How is this prevented?  Avoid drinking beverages that contain caffeine or alcohol.  Avoid certain medicines, especially medicines that are used for breathing problems.  Avoid certain herbs and herbal medicines, such as those that contain ephedra or ginseng.  Do not use illegal drugs, such as cocaine and amphetamines.  Do not smoke.  Manage  your high blood pressure. Contact a health care provider if:  You notice a change in the rate, rhythm, or strength of your heartbeat.  You are taking an anticoagulant and you notice increased bruising.  You tire more easily when you exercise or exert yourself. Get help right away if:  You have chest pain, abdominal pain, sweating, or weakness.  You feel nauseous.  You notice blood in your vomit, bowel movement, or urine.  You have shortness of breath.  You suddenly have swollen feet and ankles.  You feel dizzy.  You have sudden weakness or numbness of the face, arm, or leg, especially on one side of the body.  You have trouble speaking, trouble understanding, or both (aphasia).  Your face or your eyelid droops on one side. These symptoms may represent a serious problem that is an emergency. Do not wait to see if the symptoms will go away. Get medical help right away. Call your local emergency services (911 in the [place] Do not drive yourself to the hospital. This information is not intended to replace advice given to you by your health care provider. Make sure you discuss any questions you have with your health care provider. Document Released: 09/29/2005 Document Revised: 02/06/2016 Document Reviewed: 01/24/2015 Elsevier Interactive Patient Education  [employer].

## 2018-06-16 ENCOUNTER — Ambulatory Visit: Payer: BLUE CROSS/BLUE SHIELD | Admitting: Cardiovascular Disease

## 2018-06-16 MED ORDER — RIVAROXABAN 20 MG PO TABS: 20.0000 mg | ORAL_TABLET | Freq: Every day | ORAL | 1 refills | Status: DC

## 2018-06-16 NOTE — Assessment & Plan Note (Signed)
Increased hgbA1c 5.7 to 6.3. Encourage to make changes to diet

## 2018-06-16 NOTE — Assessment & Plan Note (Signed)
>>  ASSESSMENT AND PLAN FOR ATRIAL FIBRILLATION WITH RVR (HCC) WRITTEN ON 10/16/2022  3:19 PM BY NCHE, CHARLOTTE LUM, NP  >>ASSESSMENT AND PLAN FOR HYPERTENSIVE DISORDER WRITTEN ON 06/16/2018  1:09 PM BY NCHE, CHARLOTTE LUM, NP  Controlled with amlodipine . BP Readings from Last 3 Encounters:  06/15/18 124/82  06/04/17 (!) 146/70  03/05/17 (!) 162/80

## 2018-06-16 NOTE — Assessment & Plan Note (Signed)
>>  ASSESSMENT AND PLAN FOR HYPERTENSIVE DISORDER WRITTEN ON 06/16/2018  1:09 PM BY Willella Harding LUM, NP  Controlled with amlodipine. BP Readings from Last 3 Encounters:  06/15/18 124/82  06/04/17 (!) 146/70  03/05/17 (!) 162/80

## 2018-06-16 NOTE — Assessment & Plan Note (Signed)
Controlled with amlodipine. BP Readings from Last 3 Encounters:  06/15/18 124/82  06/04/17 (!) 146/70  03/05/17 (!) 162/80

## 2018-06-18 ENCOUNTER — Encounter: Payer: Self-pay | Admitting: Nurse Practitioner

## 2018-07-13 ENCOUNTER — Ambulatory Visit: Payer: BLUE CROSS/BLUE SHIELD | Admitting: Nurse Practitioner

## 2018-07-15 ENCOUNTER — Encounter: Payer: Self-pay | Admitting: Nurse Practitioner

## 2018-07-15 ENCOUNTER — Ambulatory Visit: Payer: BLUE CROSS/BLUE SHIELD | Admitting: Nurse Practitioner

## 2018-07-15 VITALS — BP 160/80 | HR 61 | Temp 98.0°F | Ht 68.0 in | Wt 239.0 lb

## 2018-07-15 DIAGNOSIS — I480 Paroxysmal atrial fibrillation: Secondary | ICD-10-CM

## 2018-07-15 DIAGNOSIS — I4891 Unspecified atrial fibrillation: Secondary | ICD-10-CM | POA: Diagnosis not present

## 2018-07-15 DIAGNOSIS — I10 Essential (primary) hypertension: Secondary | ICD-10-CM

## 2018-07-15 DIAGNOSIS — Z23 Encounter for immunization: Secondary | ICD-10-CM | POA: Diagnosis not present

## 2018-07-15 LAB — CBC
HCT: 33.5 % — ABNORMAL LOW (ref 36.0–46.0)
Hemoglobin: 10.6 g/dL — ABNORMAL LOW (ref 12.0–15.0)
MCHC: 31.6 g/dL (ref 30.0–36.0)
MCV: 85.9 fl (ref 78.0–100.0)
Platelets: 222 10*3/uL (ref 150.0–400.0)
RBC: 3.9 Mil/uL (ref 3.87–5.11)
RDW: 14.6 % (ref 11.5–15.5)
WBC: 4.2 10*3/uL (ref 4.0–10.5)

## 2018-07-15 LAB — BASIC METABOLIC PANEL
BUN: 25 mg/dL — ABNORMAL HIGH (ref 6–23)
CO2: 27 mEq/L (ref 19–32)
Calcium: 9.2 mg/dL (ref 8.4–10.5)
Chloride: 107 mEq/L (ref 96–112)
Creatinine, Ser: 0.79 mg/dL (ref 0.40–1.20)
GFR: 94.89 mL/min (ref >60.00)
Glucose, Bld: 92 mg/dL (ref 70–99)
POTASSIUM: 4.3 meq/L (ref 3.5–5.1)
SODIUM: 138 meq/L (ref 135–145)

## 2018-07-15 MED ORDER — RIVAROXABAN 20 MG PO TABS: 20.0000 mg | ORAL_TABLET | Freq: Every day | ORAL | 2 refills | Status: DC

## 2018-07-15 NOTE — Progress Notes (Signed)
Subjective:  Patient ID: Stacy Chapman, female    DOB: [birth year]  Age: 62 y.o. MRN: [id number]  CC: Follow-up (follow up/has not see heart doc due to work. tdap and flu shot consult?)   HPI HTN: Elevated BP today, she reports she did not take amlodipine last night. BP Readings from Last 3 Encounters:  07/15/18 (!) 160/80  06/15/18 124/82  06/04/17 (!) 146/70   Atrial fibrillation: Current use of xarelto. Reports she is unable to afford cardiology copay, so did not maintain appt. No signs of GI bleed. No SOB, no palpitation, no edema, no PND, no orthopnea.  Reviewed past Medical, Social and Family history today.  Outpatient Medications Prior to Visit  Medication Sig Dispense Refill  . acetaminophen (TYLENOL) 500 MG tablet Take 1 tablet (500 mg total) by mouth every 6 (six) hours as needed. 30 tablet 0  . albuterol (PROVENTIL HFA;VENTOLIN HFA) 108 (90 Base) MCG/ACT inhaler Inhale 1-2 puffs into the lungs every 6 (six) hours as needed for wheezing or shortness of breath. 1 Inhaler 3  . amLODipine (NORVASC) 10 MG tablet Take 1 tablet (10 mg total) by mouth at bedtime. 90 tablet 3  . cetirizine (ZYRTEC) 10 MG tablet Take 1 tablet (10 mg total) by mouth daily. 30 tablet 0  . fluticasone (FLONASE) 50 MCG/ACT nasal spray Place 2 sprays into both nostrils daily. 16 g 11  . fluticasone-salmeterol (ADVAIR HFA) 115-21 MCG/ACT inhaler Inhale 2 puffs into the lungs 2 (two) times daily. Rinse mouth after each use 1 Inhaler 11  . montelukast (SINGULAIR) 10 MG tablet Take 1 tablet (10 mg total) by mouth at bedtime. 90 tablet 3  . aspirin EC 81 MG tablet Take 81 mg by mouth daily.    . rivaroxaban (XARELTO) 20 MG TABS tablet Take 1 tablet (20 mg total) by mouth daily with supper. 30 tablet 1  . benzonatate (TESSALON) 100 MG capsule Take 1 capsule (100 mg total) by mouth 3 (three) times daily as needed for cough. (Patient not taking: Reported on 07/15/2018) 20 capsule 0  . cefdinir (OMNICEF)  300 MG capsule Take 1 capsule (300 mg total) by mouth 2 (two) times daily. (Patient not taking: Reported on 07/15/2018) 14 capsule 0  . guaiFENesin (MUCINEX) 600 MG 12 hr tablet Take 1 tablet (600 mg total) by mouth 2 (two) times daily as needed for cough or to loosen phlegm. (Patient not taking: Reported on 07/15/2018) 14 tablet 0   No facility-administered medications prior to visit.     ROS See HPI  Objective:  BP (!) 160/80   Pulse 61   Temp 98 F (36.7 C) (Oral)   Ht 5\' 8"  (1.727 m)   Wt 239 lb (108.4 kg)   SpO2 99%   BMI 36.34 kg/m   BP Readings from Last 3 Encounters:  07/15/18 (!) 160/80  06/15/18 124/82  06/04/17 (!) 146/70    Wt Readings from Last 3 Encounters:  07/15/18 239 lb (108.4 kg)  06/15/18 240 lb (108.9 kg)  06/04/17 231 lb (104.8 kg)    Physical Exam  Constitutional: She appears well-developed and well-nourished.  Neck: No JVD present.  Cardiovascular: Normal rate, regular rhythm, normal heart sounds and intact distal pulses.  Pulmonary/Chest: Effort normal and breath sounds normal.  Musculoskeletal: She exhibits no edema.  Vitals reviewed.   Lab Results  Component Value Date   WBC 4.2 07/15/2018   HGB 10.6 (L) 07/15/2018   HCT 33.5 (L) 07/15/2018   PLT 222.0 07/15/2018  GLUCOSE 92 07/15/2018   CHOL 192 06/15/2018   TRIG 154.0 (H) 06/15/2018   HDL 60.30 06/15/2018   LDLCALC 101 (H) 06/15/2018   ALT 12 06/15/2018   AST 17 06/15/2018   NA 138 07/15/2018   K 4.3 07/15/2018   CL 107 07/15/2018   CREATININE 0.79 07/15/2018   BUN 25 (H) 07/15/2018   CO2 27 07/15/2018   TSH 0.99 06/15/2018   HGBA1C 6.3 06/15/2018   MICROALBUR <0.7 06/15/2018    Ct Head Wo Contrast  Result Date: 01/20/2017 CLINICAL DATA:  62 year old female with headaches and blurry vision since 01/06/2017 when she fell in her bathroom and hit the left frontoparietal region on the bathtub EXAM: CT HEAD WITHOUT CONTRAST TECHNIQUE: Contiguous axial images were obtained from  the base of the skull through the vertex without intravenous contrast. COMPARISON:  None. FINDINGS: Brain: No evidence of acute infarction, hemorrhage, hydrocephalus, extra-axial collection or mass lesion/mass effect. Mild periventricular, subcortical and deep white matter hypoattenuation which is nonspecific but most suggestive of chronic microvascular ischemic white matter disease. Vascular: No hyperdense vessel or unexpected calcification. Skull: Normal. Negative for acute fracture or focal lesion. Likely remote left nasal bone fracture. Sinuses/Orbits: Moderate mucoperiosteal thickening throughout the ethmoid air cells and right maxillary sinus. Small air-fluid levels are present within the left maxillary sinus, sphenoid sinus and multiple ethmoid sinuses. The mastoid air cells are well aerated bilaterally. Other: None. IMPRESSION: 1. No acute intracranial abnormality. 2. 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 3. Moderate inflammatory paranasal sinus disease. Acute sinusitis is not excluded. 4. Likely remote left nasal bone fracture. Electronically Signed   By: Jacqulynn Cadet M.D.   On: 01/20/2017 15:18   ECG: NSR, no ST abnormality.  Assessment & Plan:   Stacy Chapman was seen today for follow-up.  Diagnoses and all orders for this visit:  Paroxysmal atrial fibrillation (HCC) -     CBC -     Basic metabolic panel -     EKG [id number] -     rivaroxaban (XARELTO) 20 MG TABS tablet; Take 1 tablet (20 mg total) by mouth daily with supper.  HTN (hypertension), benign -     CBC -     Basic metabolic panel  Need for influenza vaccination -     Flu vaccine HIGH DOSE PF  Need for diphtheria-tetanus-pertussis (Tdap) vaccine -     Tdap vaccine greater than or equal to 7yo IM  Need for pneumococcal vaccination -     Pneumococcal polysaccharide vaccine 23-valent greater than or equal to 2yo subcutaneous/IM   I have discontinued Stacy Chapman. Stacy Chapman's benzonatate,  guaiFENesin, cefdinir, and aspirin EC. I am also having her maintain her acetaminophen, cetirizine, fluticasone, fluticasone-salmeterol, albuterol, amLODipine, montelukast, and rivaroxaban.  Meds ordered this encounter  Medications  . rivaroxaban (XARELTO) 20 MG TABS tablet    Sig: Take 1 tablet (20 mg total) by mouth daily with supper.    Dispense:  30 tablet    Refill:  2    Order Specific Question:   Supervising Provider    Answer:   Lucille Passy [[id number]]    Follow-up: Return in about 3 months (around 10/15/2018) for CPE (fasting)).  Wilfred Lacy, NP

## 2018-07-15 NOTE — Patient Instructions (Addendum)
Rhythm has converted to normal sinus rhythm. I will still recommend an evaluation by cardiology. Continue current medications and take amlodipine when you get home.  Go to lab for repeat cbc and BMP.  Atrial Fibrillation Atrial fibrillation is a type of heartbeat that is irregular or fast (rapid). If you have this condition, your heart keeps quivering in a weird (chaotic) way. This condition can make it so your heart cannot pump blood normally. Having this condition gives a person more risk for stroke, heart failure, and other heart problems. There are different types of atrial fibrillation. Talk with your doctor to learn about the type that you have. Follow these instructions at home:  Take over-the-counter and prescription medicines only as told by your doctor.  If your doctor prescribed a blood-thinning medicine, take it exactly as told. Taking too much of it can cause bleeding. If you do not take enough of it, you will not have the protection that you need against stroke and other problems.  Do not use any tobacco products. These include cigarettes, chewing tobacco, and e-cigarettes. If you need help quitting, ask your doctor.  If you have apnea (obstructive sleep apnea), manage it as told by your doctor.  Do not drink alcohol.  Do not drink beverages that have caffeine. These include coffee, soda, and tea.  Maintain a healthy weight. Do not use diet pills unless your doctor says they are safe for you. Diet pills may make heart problems worse.  Follow diet instructions as told by your doctor.  Exercise regularly as told by your doctor.  Keep all follow-up visits as told by your doctor. This is important. Contact a doctor if:  You notice a change in the speed, rhythm, or strength of your heartbeat.  You are taking a blood-thinning medicine and you notice more bruising.  You get tired more easily when you move or exercise. Get help right away if:  You have pain in your chest  or your belly (abdomen).  You have sweating or weakness.  You feel sick to your stomach (nauseous).  You notice blood in your throw up (vomit), poop (stool), or pee (urine).  You are short of breath.  You suddenly have swollen feet and ankles.  You feel dizzy.  Your suddenly get weak or numb in your face, arms, or legs, especially if it happens on one side of your body.  You have trouble talking, trouble understanding, or both.  Your face or your eyelid droops on one side. These symptoms may be an emergency. Do not wait to see if the symptoms will go away. Get medical help right away. Call your local emergency services (911 in the [place] Do not drive yourself to the hospital. This information is not intended to replace advice given to you by your health care provider. Make sure you discuss any questions you have with your health care provider. Document Released: 07/08/2008 Document Revised: 03/06/2016 Document Reviewed: 01/24/2015 Elsevier Interactive Patient Education  [employer].

## 2018-07-19 DIAGNOSIS — I480 Paroxysmal atrial fibrillation: Secondary | ICD-10-CM | POA: Insufficient documentation

## 2018-07-19 NOTE — Assessment & Plan Note (Signed)
A-fib with RVR noted 06/04/2018, she was symptomatic (SOB, palpitations), started on xarelto, entered referral to cardiology. She did not maintain appt with cardiology. Today's ECG indicates NSR. Advised to maintain xarelto and make appt with cardiology ASAP.

## 2018-07-19 NOTE — Assessment & Plan Note (Signed)
>>  ASSESSMENT AND PLAN FOR ATRIAL FIBRILLATION WITH RVR (HCC) WRITTEN ON 10/16/2022  3:19 PM BY NCHE, CHARLOTTE LUM, NP  >>ASSESSMENT AND PLAN FOR PAROXYSMAL ATRIAL FIBRILLATION (HCC) WRITTEN ON 07/19/2018  1:02 PM BY NCHE, CHARLOTTE LUM, NP  A-fib with RVR noted 06/04/2018, she was symptomatic (SOB, palpitations), started on xarelto , entered referral to cardiology. She did not maintain appt with cardiology. Today's ECG indicates NSR. Advised to maintain xarelto  and make appt with cardiology ASAP.

## 2018-07-19 NOTE — Assessment & Plan Note (Signed)
>>  ASSESSMENT AND PLAN FOR PAROXYSMAL ATRIAL FIBRILLATION ([place]) WRITTEN ON 07/19/2018  1:02 PM BY Mariene Dickerman LUM, NP  A-fib with RVR noted 06/04/2018, she was symptomatic (SOB, palpitations), started on xarelto, entered referral to cardiology. She did not maintain appt with cardiology. Today's ECG indicates NSR. Advised to maintain xarelto and make appt with cardiology ASAP.

## 2018-07-29 ENCOUNTER — Ambulatory Visit: Payer: BLUE CROSS/BLUE SHIELD

## 2018-09-13 ENCOUNTER — Other Ambulatory Visit: Payer: Self-pay | Admitting: Nurse Practitioner

## 2018-09-13 DIAGNOSIS — J309 Allergic rhinitis, unspecified: Secondary | ICD-10-CM

## 2018-09-13 DIAGNOSIS — J324 Chronic pansinusitis: Secondary | ICD-10-CM

## 2018-10-04 LAB — HM DIABETES EYE EXAM

## 2018-10-21 ENCOUNTER — Encounter: Payer: Self-pay | Admitting: Nurse Practitioner

## 2018-10-21 ENCOUNTER — Ambulatory Visit (INDEPENDENT_AMBULATORY_CARE_PROVIDER_SITE_OTHER): Payer: BLUE CROSS/BLUE SHIELD | Admitting: Nurse Practitioner

## 2018-10-21 ENCOUNTER — Other Ambulatory Visit (HOSPITAL_COMMUNITY)
Admission: RE | Admit: 2018-10-21 | Discharge: 2018-10-21 | Disposition: A | Discharge status: Home / Self Care | Payer: BLUE CROSS/BLUE SHIELD | Source: Ambulatory Visit | Attending: Nurse Practitioner | Admitting: Nurse Practitioner

## 2018-10-21 VITALS — BP 134/80 | HR 69 | Temp 98.2°F | Ht 68.0 in | Wt 239.2 lb

## 2018-10-21 DIAGNOSIS — Z124 Encounter for screening for malignant neoplasm of cervix: Secondary | ICD-10-CM

## 2018-10-21 DIAGNOSIS — E119 Type 2 diabetes mellitus without complications: Secondary | ICD-10-CM

## 2018-10-21 DIAGNOSIS — Z136 Encounter for screening for cardiovascular disorders: Secondary | ICD-10-CM | POA: Diagnosis not present

## 2018-10-21 DIAGNOSIS — I10 Essential (primary) hypertension: Secondary | ICD-10-CM | POA: Diagnosis not present

## 2018-10-21 DIAGNOSIS — Z1211 Encounter for screening for malignant neoplasm of colon: Secondary | ICD-10-CM

## 2018-10-21 DIAGNOSIS — Z0001 Encounter for general adult medical examination with abnormal findings: Secondary | ICD-10-CM

## 2018-10-21 DIAGNOSIS — I480 Paroxysmal atrial fibrillation: Secondary | ICD-10-CM | POA: Diagnosis not present

## 2018-10-21 DIAGNOSIS — B9689 Other specified bacterial agents as the cause of diseases classified elsewhere: Secondary | ICD-10-CM

## 2018-10-21 DIAGNOSIS — J4521 Mild intermittent asthma with (acute) exacerbation: Secondary | ICD-10-CM | POA: Diagnosis not present

## 2018-10-21 DIAGNOSIS — Z13220 Encounter for screening for lipoid disorders: Secondary | ICD-10-CM | POA: Diagnosis not present

## 2018-10-21 DIAGNOSIS — N760 Acute vaginitis: Secondary | ICD-10-CM

## 2018-10-21 LAB — LIPID PANEL
Cholesterol: 192 mg/dL (ref 0–200)
HDL: 63.1 mg/dL (ref >39.00)
LDL Cholesterol: 109 mg/dL — ABNORMAL HIGH (ref 0–99)
NonHDL: 129.3
TRIGLYCERIDES: 102 mg/dL (ref 0.0–149.0)
Total CHOL/HDL Ratio: 3
VLDL: 20.4 mg/dL (ref 0.0–40.0)

## 2018-10-21 LAB — CBC
HCT: 35.5 % — ABNORMAL LOW (ref 36.0–46.0)
HEMOGLOBIN: 11.5 g/dL — AB (ref 12.0–15.0)
MCHC: 32.3 g/dL (ref 30.0–36.0)
MCV: 85.4 fl (ref 78.0–100.0)
PLATELETS: 265 10*3/uL (ref 150.0–400.0)
RBC: 4.15 Mil/uL (ref 3.87–5.11)
RDW: 14.6 % (ref 11.5–15.5)
WBC: 4.9 10*3/uL (ref 4.0–10.5)

## 2018-10-21 LAB — MICROALBUMIN / CREATININE URINE RATIO
Creatinine,U: 94.1 mg/dL
Microalb Creat Ratio: 0.7 mg/g (ref 0.0–30.0)
Microalb, Ur: <0.7 mg/dL (ref 0.0–1.9)

## 2018-10-21 LAB — COMPREHENSIVE METABOLIC PANEL
ALK PHOS: 52 U/L (ref 39–117)
ALT: 14 U/L (ref 0–35)
AST: 17 U/L (ref 0–37)
Albumin: 3.9 g/dL (ref 3.5–5.2)
BUN: 26 mg/dL — ABNORMAL HIGH (ref 6–23)
CALCIUM: 9.6 mg/dL (ref 8.4–10.5)
CO2: 24 meq/L (ref 19–32)
Chloride: 106 mEq/L (ref 96–112)
Creatinine, Ser: 0.78 mg/dL (ref 0.40–1.20)
GFR: 96.21 mL/min (ref >60.00)
GLUCOSE: 94 mg/dL (ref 70–99)
POTASSIUM: 4.7 meq/L (ref 3.5–5.1)
Sodium: 137 mEq/L (ref 135–145)
TOTAL PROTEIN: 8 g/dL (ref 6.0–8.3)
Total Bilirubin: 0.5 mg/dL (ref 0.2–1.2)

## 2018-10-21 LAB — TSH: TSH: 1.2 u[IU]/mL (ref 0.35–4.50)

## 2018-10-21 LAB — HEMOGLOBIN A1C: HEMOGLOBIN A1C: 6.1 % (ref 4.6–6.5)

## 2018-10-21 MED ORDER — MONTELUKAST SODIUM 10 MG PO TABS: 10.0000 mg | ORAL_TABLET | Freq: Every day | ORAL | 3 refills | Status: DC

## 2018-10-21 MED ORDER — AMLODIPINE BESYLATE 10 MG PO TABS: 10.0000 mg | ORAL_TABLET | Freq: Every day | ORAL | 3 refills | Status: DC

## 2018-10-21 MED ORDER — FLUTICASONE-SALMETEROL 115-21 MCG/ACT IN AERO: 2.0000 | INHALATION_SPRAY | Freq: Two times a day (BID) | RESPIRATORY_TRACT | 11 refills | Status: DC

## 2018-10-21 NOTE — Patient Instructions (Addendum)
Please schedule appt with cardiology as discussed You need to contact Breast center in [facility] for mammogram. Schedule appt for dental cleaning at least once a year  You will be contacted to schedule appt with GI.  We will obtain recent eye report from [facility].  Stop use of xarelto.  Normal lab results.  Health Maintenance, Female Adopting a healthy lifestyle and getting preventive care can go a long way to promote health and wellness. Talk with your health care provider about what schedule of regular examinations is right for you. This is a good chance for you to check in with your provider about disease prevention and staying healthy. In between checkups, there are plenty of things you can do on your own. Experts have done a lot of research about which lifestyle changes and preventive measures are most likely to keep you healthy. Ask your health care provider for more information. Weight and diet Eat a healthy diet  Be sure to include plenty of vegetables, fruits, low-fat dairy products, and lean protein.  Do not eat a lot of foods high in solid fats, added sugars, or salt.  Get regular exercise. This is one of the most important things you can do for your health. ? Most adults should exercise for at least 150 minutes each week. The exercise should increase your heart rate and make you sweat (moderate-intensity exercise). ? Most adults should also do strengthening exercises at least twice a week. This is in addition to the moderate-intensity exercise. Maintain a healthy weight  Body mass index (BMI) is a measurement that can be used to identify possible weight problems. It estimates body fat based on height and weight. Your health care provider can help determine your BMI and help you achieve or maintain a healthy weight.  For females 81 years of age and older: ? A BMI below 18.5 is considered underweight. ? A BMI of 18.5 to 24.9 is normal. ? A BMI of 25 to 29.9 is  considered overweight. ? A BMI of 30 and above is considered obese. Watch levels of cholesterol and blood lipids  You should start having your blood tested for lipids and cholesterol at 63 years of age, then have this test every 5 years.  You may need to have your cholesterol levels checked more often if: ? Your lipid or cholesterol levels are high. ? You are older than 63 years of age. ? You are at high risk for heart disease. Cancer screening Lung Cancer  Lung cancer screening is recommended for adults 24-37 years old who are at high risk for lung cancer because of a history of smoking.  A yearly low-dose CT scan of the lungs is recommended for people who: ? Currently smoke. ? Have quit within the past 15 years. ? Have at least a 30-pack-year history of smoking. A pack year is smoking an average of one pack of cigarettes a day for 1 year.  Yearly screening should continue until it has been 15 years since you quit.  Yearly screening should stop if you develop a health problem that would prevent you from having lung cancer treatment. Breast Cancer  Practice breast self-awareness. This means understanding how your breasts normally appear and feel.  It also means doing regular breast self-exams. Let your health care provider know about any changes, no matter how small.  If you are in your 20s or 30s, you should have a clinical breast exam (CBE) by a health care provider every 1-3 years  as part of a regular health exam.  If you are 33 or older, have a CBE every year. Also consider having a breast X-ray (mammogram) every year.  If you have a family history of breast cancer, talk to your health care provider about genetic screening.  If you are at high risk for breast cancer, talk to your health care provider about having an MRI and a mammogram every year.  Breast cancer gene (BRCA) assessment is recommended for women who have family members with BRCA-related cancers. BRCA-related  cancers include: ? Breast. ? Ovarian. ? Tubal. ? Peritoneal cancers.  Results of the assessment will determine the need for genetic counseling and BRCA1 and BRCA2 testing. Cervical Cancer Your health care provider may recommend that you be screened regularly for cancer of the pelvic organs (ovaries, uterus, and vagina). This screening involves a pelvic examination, including checking for microscopic changes to the surface of your cervix (Pap test). You may be encouraged to have this screening done every 3 years, beginning at age 40.  For women ages 82-65, health care providers may recommend pelvic exams and Pap testing every 3 years, or they may recommend the Pap and pelvic exam, combined with testing for human papilloma virus (HPV), every 5 years. Some types of HPV increase your risk of cervical cancer. Testing for HPV may also be done on women of any age with unclear Pap test results.  Other health care providers may not recommend any screening for nonpregnant women who are considered low risk for pelvic cancer and who do not have symptoms. Ask your health care provider if a screening pelvic exam is right for you.  If you have had past treatment for cervical cancer or a condition that could lead to cancer, you need Pap tests and screening for cancer for at least 20 years after your treatment. If Pap tests have been discontinued, your risk factors (such as having a new sexual partner) need to be reassessed to determine if screening should resume. Some women have medical problems that increase the chance of getting cervical cancer. In these cases, your health care provider may recommend more frequent screening and Pap tests. Colorectal Cancer  This type of cancer can be detected and often prevented.  Routine colorectal cancer screening usually begins at 63 years of age and continues through 63 years of age.  Your health care provider may recommend screening at an earlier age if you have risk  factors for colon cancer.  Your health care provider may also recommend using home test kits to check for hidden blood in the stool.  A small camera at the end of a tube can be used to examine your colon directly (sigmoidoscopy or colonoscopy). This is done to check for the earliest forms of colorectal cancer.  Routine screening usually begins at age 67.  Direct examination of the colon should be repeated every 5-10 years through 63 years of age. However, you may need to be screened more often if early forms of precancerous polyps or small growths are found. Skin Cancer  Check your skin from head to toe regularly.  Tell your health care provider about any new moles or changes in moles, especially if there is a change in a mole's shape or color.  Also tell your health care provider if you have a mole that is larger than the size of a pencil eraser.  Always use sunscreen. Apply sunscreen liberally and repeatedly throughout the day.  Protect yourself by wearing long sleeves,  pants, a wide-brimmed hat, and sunglasses whenever you are outside. Heart disease, diabetes, and high blood pressure  High blood pressure causes heart disease and increases the risk of stroke. High blood pressure is more likely to develop in: ? People who have blood pressure in the high end of the normal range (130-139/85-89 mm Hg). ? People who are overweight or obese. ? People who are African American.  If you are 45-13 years of age, have your blood pressure checked every 3-5 years. If you are 51 years of age or older, have your blood pressure checked every year. You should have your blood pressure measured twice-once when you are at a hospital or clinic, and once when you are not at a hospital or clinic. Record the average of the two measurements. To check your blood pressure when you are not at a hospital or clinic, you can use: ? An automated blood pressure machine at a pharmacy. ? A home blood pressure  monitor.  If you are between 53 years and 51 years old, ask your health care provider if you should take aspirin to prevent strokes.  Have regular diabetes screenings. This involves taking a blood sample to check your fasting blood sugar level. ? If you are at a normal weight and have a low risk for diabetes, have this test once every three years after 63 years of age. ? If you are overweight and have a high risk for diabetes, consider being tested at a younger age or more often. Preventing infection Hepatitis B  If you have a higher risk for hepatitis B, you should be screened for this virus. You are considered at high risk for hepatitis B if: ? You were born in a country where hepatitis B is common. Ask your health care provider which countries are considered high risk. ? Your parents were born in a high-risk country, and you have not been immunized against hepatitis B (hepatitis B vaccine). ? You have HIV or AIDS. ? You use needles to inject street drugs. ? You live with someone who has hepatitis B. ? You have had sex with someone who has hepatitis B. ? You get hemodialysis treatment. ? You take certain medicines for conditions, including cancer, organ transplantation, and autoimmune conditions. Hepatitis C  Blood testing is recommended for: ? Everyone born from [birth year] through 1965. ? Anyone with known risk factors for hepatitis C. Sexually transmitted infections (STIs)  You should be screened for sexually transmitted infections (STIs) including gonorrhea and chlamydia if: ? You are sexually active and are younger than 63 years of age. ? You are older than 63 years of age and your health care provider tells you that you are at risk for this type of infection. ? Your sexual activity has changed since you were last screened and you are at an increased risk for chlamydia or gonorrhea. Ask your health care provider if you are at risk.  If you do not have HIV, but are at risk, it may be  recommended that you take a prescription medicine daily to prevent HIV infection. This is called pre-exposure prophylaxis (PrEP). You are considered at risk if: ? You are sexually active and do not regularly use condoms or know the HIV status of your partner(s). ? You take drugs by injection. ? You are sexually active with a partner who has HIV. Talk with your health care provider about whether you are at high risk of being infected with HIV. If you choose to begin PrEP,  you should first be tested for HIV. You should then be tested every 3 months for as long as you are taking PrEP. Pregnancy  If you are premenopausal and you may become pregnant, ask your health care provider about preconception counseling.  If you may become pregnant, take 400 to 800 micrograms (mcg) of folic acid every day.  If you want to prevent pregnancy, talk to your health care provider about birth control (contraception). Osteoporosis and menopause  Osteoporosis is a disease in which the bones lose minerals and strength with aging. This can result in serious bone fractures. Your risk for osteoporosis can be identified using a bone density scan.  If you are 25 years of age or older, or if you are at risk for osteoporosis and fractures, ask your health care provider if you should be screened.  Ask your health care provider whether you should take a calcium or vitamin D supplement to lower your risk for osteoporosis.  Menopause may have certain physical symptoms and risks.  Hormone replacement therapy may reduce some of these symptoms and risks. Talk to your health care provider about whether hormone replacement therapy is right for you. Follow these instructions at home:  Schedule regular health, dental, and eye exams.  Stay current with your immunizations.  Do not use any tobacco products including cigarettes, chewing tobacco, or electronic cigarettes.  If you are pregnant, do not drink alcohol.  If you are  breastfeeding, limit how much and how often you drink alcohol.  Limit alcohol intake to no more than 1 drink per day for nonpregnant women. One drink equals 12 ounces of beer, 5 ounces of wine, or 1 ounces of hard liquor.  Do not use street drugs.  Do not share needles.  Ask your health care provider for help if you need support or information about quitting drugs.  Tell your health care provider if you often feel depressed.  Tell your health care provider if you have ever been abused or do not feel safe at home. This information is not intended to replace advice given to you by your health care provider. Make sure you discuss any questions you have with your health care provider. Document Released: 04/14/2011 Document Revised: 03/06/2016 Document Reviewed: 07/03/2015 Elsevier Interactive Patient Education  2019 [employer].

## 2018-10-21 NOTE — Progress Notes (Addendum)
Subjective:    Patient ID: Stacy Chapman, female    DOB: July 03, 1956, 63 y.o.   MRN: [id number]  Patient presents today for complete physical   HPI PAF: NSR today. Stopped xarelto last month. Denies any palpitations or CP or SOB or dizziness. Did not make appt with cardiology due to lack of finance  Asthma: Stopped using advair x 53month. Uses albuterol once a week.  HTN: Controlled with amlodipine.  DM: Controlled with diet. Last HgbA1c of 6.3 No neuropathy. Eye exam complete. Dental cleaning not done, has upper dentures.  Sexual History (orientation,birth control, marital status, STD):single, sexually active.  Depression/Suicide: Depression screen [facility] 2/9 10/21/2018 06/15/2018 01/15/2017  Decreased Interest 0 0 0  Down, Depressed, Hopeless 0 0 0  PHQ - 2 Score 0 0 0   No flowsheet data found.  Vision:eye exam done by [facility] 09/2018, normal per patient  Dental: will schedule  Immunizations: (TDAP, Hep C screen, Pneumovax, Influenza, zoster)  Health Maintenance  Topic Date Due  . Pap Smear  09/12/1977  . Mammogram  09/12/2006  . Colon Cancer Screening  09/12/2006  . HIV Screening  06/16/2019*  . Hemoglobin A1C  04/21/2019  . Eye exam for diabetics  10/05/2019  . Complete foot exam   10/22/2019  . Urine Protein Check  10/22/2019  . Tetanus Vaccine  07/15/2028  . Flu Shot  Completed  . Pneumococcal vaccine  Completed  .  Hepatitis C: One time screening is recommended by Center for Disease Control  (CDC) for  adults born from [birth year] through 1965.   Completed  *Topic was postponed. The date shown is not the original due date.   Weight:  Wt Readings from Last 3 Encounters:  10/21/18 239 lb 3.2 oz (108.5 kg)  07/15/18 239 lb (108.4 kg)  06/15/18 240 lb (108.9 kg)    Exercise:start daily exercise: 5mins on elliptical machine  Fall Risk: Fall Risk  10/21/2018 06/15/2018 01/15/2017  Falls in the past year? 0 No Yes  Number falls in past yr: - - 1  Injury  with Fall? - - Yes  Comment - - body pain   Advanced Directive: Advanced Directives 01/06/2017  Does Patient Have a Medical Advance Directive? No     Medications and allergies reviewed with patient and updated if appropriate.  Patient Active Problem List   Diagnosis Date Noted  . Mild intermittent asthma with acute exacerbation 10/22/2018  . Paroxysmal atrial fibrillation ([place]) 07/19/2018  . Bronchitis 03/05/2017  . HTN (hypertension), benign 01/22/2017  . Elevated liver enzymes 01/22/2017  . Chronic pansinusitis 01/22/2017  . DM (diabetes mellitus) ([place]) 01/22/2017  . Abnormal head CT 01/22/2017    Current Outpatient Medications on File Prior to Visit  Medication Sig Dispense Refill  . acetaminophen (TYLENOL) 500 MG tablet Take 1 tablet (500 mg total) by mouth every 6 (six) hours as needed. 30 tablet 0  . albuterol (PROVENTIL HFA;VENTOLIN HFA) 108 (90 Base) MCG/ACT inhaler Inhale 1-2 puffs into the lungs every 6 (six) hours as needed for wheezing or shortness of breath. 1 Inhaler 3  . fluticasone (FLONASE) 50 MCG/ACT nasal spray SHAKE LIQUID AND USE 2 SPRAYS IN EACH NOSTRIL DAILY 16 g 1   No current facility-administered medications on file prior to visit.     Past Medical History:  Diagnosis Date  . Arthritis     Past Surgical History:  Procedure Laterality Date  . KNEE ARTHROSCOPY    . TONSILLECTOMY  Social History   Socioeconomic History  . Marital status: Legally Separated    Spouse name: Not on file  . Number of children: Not on file  . Years of education: Not on file  . Highest education level: Not on file  Occupational History  . Not on file  Social Needs  . Financial resource strain: Not on file  . Food insecurity:    Worry: Not on file    Inability: Not on file  . Transportation needs:    Medical: Not on file    Non-medical: Not on file  Tobacco Use  . Smoking status: Never Smoker  . Smokeless tobacco: Never Used  Substance and Sexual  Activity  . Alcohol use: Yes    Alcohol/week: 5.0 standard drinks    Types: 5 Standard drinks or equivalent per week    Comment: Drinks multiple times a week  . Drug use: No  . Sexual activity: Yes    Birth control/protection: None  Lifestyle  . Physical activity:    Days per week: Not on file    Minutes per session: Not on file  . Stress: Not on file  Relationships  . Social connections:    Talks on phone: Not on file    Gets together: Not on file    Attends religious service: Not on file    Active member of club or organization: Not on file    Attends meetings of clubs or organizations: Not on file    Relationship status: Not on file  Other Topics Concern  . Not on file  Social History Narrative  . Not on file    Family History  Problem Relation Age of Onset  . Stroke Father 58       death  . Hypertension Father   . Dementia Sister   . Diabetes Sister   . Eczema Sister   . Hypertension Brother   . Stroke Brother 53       death  . Cancer Brother        lung  . Cancer Paternal Uncle        unknown  . Dementia Sister         ROS  Objective:   Vitals:   10/21/18 0930  BP: 134/80  Pulse: 69  Temp: 98.2 F (36.8 C)  SpO2: 98%    Body mass index is 36.37 kg/m.   Physical Examination:  Physical Exam Vitals signs reviewed. Exam conducted with a chaperone present.  Constitutional:      General: She is not in acute distress. HENT:     Right Ear: External ear normal.     Left Ear: External ear normal.     Nose: Nose normal.     Mouth/Throat:     Pharynx: No oropharyngeal exudate or posterior oropharyngeal erythema.  Eyes:     General: No scleral icterus.    Conjunctiva/sclera: Conjunctivae normal.     Pupils: Pupils are equal, round, and reactive to light.  Neck:     Musculoskeletal: Normal range of motion and neck supple.     Thyroid: No thyromegaly.  Cardiovascular:     Rate and Rhythm: Normal rate.     Heart sounds: Normal heart sounds.    Pulmonary:     Effort: Pulmonary effort is normal.     Breath sounds: Normal breath sounds.  Chest:     Chest wall: No tenderness.     Breasts:        Right: Normal.  Left: Normal.  Abdominal:     General: Bowel sounds are normal. There is no distension.     Palpations: Abdomen is soft.     Tenderness: There is no abdominal tenderness.  Genitourinary:    Labia:        Right: No rash, tenderness or lesion.        Left: No rash, tenderness or lesion.      Vagina: Vaginal discharge present. No erythema.     Cervix: Normal.     Adnexa: Right adnexa normal and left adnexa normal.  Musculoskeletal: Normal range of motion.        General: No tenderness.  Lymphadenopathy:     Cervical: No cervical adenopathy.     Upper Body:     Right upper body: No supraclavicular, axillary or pectoral adenopathy.     Left upper body: No supraclavicular, axillary or pectoral adenopathy.     Lower Body: No right inguinal adenopathy. No left inguinal adenopathy.  Skin:    General: Skin is warm and dry.  Neurological:     Mental Status: She is alert and oriented to person, place, and time.  Psychiatric:        Judgment: Judgment normal.     ASSESSMENT and PLAN:  Tanashia was seen today for annual exam.  Diagnoses and all orders for this visit:  Encounter for preventative adult health care exam with abnormal findings -     Comprehensive metabolic panel -     CBC -     TSH -     Lipid panel  HTN (hypertension), benign -     amLODipine (NORVASC) 10 MG tablet; Take 1 tablet (10 mg total) by mouth at bedtime.  Paroxysmal atrial fibrillation (HCC)  Type 2 diabetes mellitus without complication, without long-term current use of insulin (HCC) -     Hemoglobin A1c -     Microalbumin / creatinine urine ratio  Encounter for lipid screening for cardiovascular disease -     Lipid panel  Colon cancer screening -     Ambulatory referral to Gastroenterology  Encounter for Papanicolaou smear  for cervical cancer screening -     Cytology - PAP( [place])  Mild intermittent asthma with acute exacerbation -     montelukast (SINGULAIR) 10 MG tablet; Take 1 tablet (10 mg total) by mouth at bedtime. -     fluticasone-salmeterol (ADVAIR HFA) 115-21 MCG/ACT inhaler; Inhale 2 puffs into the lungs 2 (two) times daily. Rinse mouth after each use  BV (bacterial vaginosis) -     metroNIDAZOLE (METROGEL VAGINAL) 0.75 % vaginal gel; Place 1 Applicatorful vaginally at bedtime.   No problem-specific Assessment & Plan notes found for this encounter.     Problem List Items Addressed This Visit      Cardiovascular and Mediastinum   HTN (hypertension), benign   Relevant Medications   amLODipine (NORVASC) 10 MG tablet   Paroxysmal atrial fibrillation (HCC)   Relevant Medications   amLODipine (NORVASC) 10 MG tablet     Respiratory   Mild intermittent asthma with acute exacerbation   Relevant Medications   montelukast (SINGULAIR) 10 MG tablet   fluticasone-salmeterol (ADVAIR HFA) 115-21 MCG/ACT inhaler     Endocrine   DM (diabetes mellitus) ([place])   Relevant Orders   Hemoglobin A1c (Completed)   Microalbumin / creatinine urine ratio (Completed)    Other Visit Diagnoses    Encounter for preventative adult health care exam with abnormal findings    -  Primary   Relevant Orders   Comprehensive metabolic panel (Completed)   CBC (Completed)   TSH (Completed)   Lipid panel (Completed)   Encounter for lipid screening for cardiovascular disease       Relevant Orders   Lipid panel (Completed)   Colon cancer screening       Relevant Orders   Ambulatory referral to Gastroenterology   Encounter for Papanicolaou smear for cervical cancer screening       Relevant Orders   Cytology - PAP( [place]) (Completed)   BV (bacterial vaginosis)       Relevant Medications   metroNIDAZOLE (METROGEL VAGINAL) 0.75 % vaginal gel       Follow up: Return in about 6 months (around 04/21/2019) for  DM and HTN, hyperlipidemia.  Wilfred Lacy, NP

## 2018-10-22 ENCOUNTER — Encounter: Payer: Self-pay | Admitting: Nurse Practitioner

## 2018-10-22 DIAGNOSIS — J4521 Mild intermittent asthma with (acute) exacerbation: Secondary | ICD-10-CM | POA: Insufficient documentation

## 2018-10-22 NOTE — Progress Notes (Signed)
Abstracted result and sent to scan  

## 2018-10-25 LAB — CYTOLOGY - PAP
Bacterial vaginitis: POSITIVE — AB
CHLAMYDIA, DNA PROBE: NEGATIVE
Candida vaginitis: NEGATIVE
Diagnosis: NEGATIVE
HPV 16/18/45 genotyping: NEGATIVE
HPV: DETECTED — AB
Neisseria Gonorrhea: NEGATIVE
Trichomonas: NEGATIVE

## 2018-10-25 MED ORDER — METRONIDAZOLE 0.75 % VA GEL: 1.0000 | Freq: Every day | VAGINAL | 0 refills | Status: DC

## 2018-10-25 NOTE — Addendum Note (Signed)
Addended by: Wilfred Lacy L on: 10/25/2018 05:00 PM   Modules accepted: Orders

## 2018-10-27 ENCOUNTER — Ambulatory Visit: Payer: BLUE CROSS/BLUE SHIELD | Admitting: Nurse Practitioner

## 2018-10-27 DIAGNOSIS — H6122 Impacted cerumen, left ear: Secondary | ICD-10-CM | POA: Diagnosis not present

## 2018-10-27 MED ORDER — CARBAMIDE PEROXIDE 6.5 % OT SOLN: 5.0000 [drp] | Freq: Every day | OTIC | 0 refills | Status: DC

## 2018-10-27 NOTE — Patient Instructions (Addendum)
Return to office on Tuesday for ear irrigation.  Start use of debrox drop on Friday at [facility], then return to office for ear irrigation.   Earwax Buildup, Adult The ears produce a substance called earwax that helps keep bacteria out of the ear and protects the skin in the ear canal. Occasionally, earwax can build up in the ear and cause discomfort or hearing loss. What increases the risk? This condition is more likely to develop in people who:  Are female.  Are elderly.  Naturally produce more earwax.  Clean their ears often with cotton swabs.  Use earplugs often.  Use in-ear headphones often.  Wear hearing aids.  Have narrow ear canals.  Have earwax that is overly thick or sticky.  Have eczema.  Are dehydrated.  Have excess hair in the ear canal. What are the signs or symptoms? Symptoms of this condition include:  Reduced or muffled hearing.  A feeling of fullness in the ear or feeling that the ear is plugged.  Fluid coming from the ear.  Ear pain.  Ear itch.  Ringing in the ear.  Coughing.  An obvious piece of earwax that can be seen inside the ear canal. How is this diagnosed? This condition may be diagnosed based on:  Your symptoms.  Your medical history.  An ear exam. During the exam, your health care provider will look into your ear with an instrument called an otoscope. You may have tests, including a hearing test. How is this treated? This condition may be treated by:  Using ear drops to soften the earwax.  Having the earwax removed by a health care provider. The health care provider may: ? Flush the ear with water. ? Use an instrument that has a loop on the end (curette). ? Use a suction device.  Surgery to remove the wax buildup. This may be done in severe cases. Follow these instructions at home:   Take over-the-counter and prescription medicines only as told by your health care provider.  Do not put any objects, including cotton swabs,  into your ear. You can clean the opening of your ear canal with a washcloth or facial tissue.  Follow instructions from your health care provider about cleaning your ears. Do not over-clean your ears.  Drink enough fluid to keep your urine clear or pale yellow. This will help to thin the earwax.  Keep all follow-up visits as told by your health care provider. If earwax builds up in your ears often or if you use hearing aids, consider seeing your health care provider for routine, preventive ear cleanings. Ask your health care provider how often you should schedule your cleanings.  If you have hearing aids, clean them according to instructions from the manufacturer and your health care provider. Contact a health care provider if:  You have ear pain.  You develop a fever.  You have blood, pus, or other fluid coming from your ear.  You have hearing loss.  You have ringing in your ears that does not go away.  Your symptoms do not improve with treatment.  You feel like the room is spinning (vertigo). Summary  Earwax can build up in the ear and cause discomfort or hearing loss.  The most common symptoms of this condition include reduced or muffled hearing and a feeling of fullness in the ear or feeling that the ear is plugged.  This condition may be diagnosed based on your symptoms, your medical history, and an ear exam.  This condition  may be treated by using ear drops to soften the earwax or by having the earwax removed by a health care provider.  Do not put any objects, including cotton swabs, into your ear. You can clean the opening of your ear canal with a washcloth or facial tissue. This information is not intended to replace advice given to you by your health care provider. Make sure you discuss any questions you have with your health care provider. Document Released: 11/06/2004 Document Revised: 09/10/2017 Document Reviewed: 12/10/2016 Elsevier Interactive Patient Education   2019 [employer].

## 2018-10-27 NOTE — Progress Notes (Addendum)
Subjective:  Patient ID: Stacy Chapman, female    DOB: 18-Jun-1956  Age: 63 y.o. MRN: [id number]  CC: Ear Fullness   Ear Fullness   There is pain in both ears. This is a chronic problem. The current episode started more than 1 month ago. The problem occurs constantly. The problem has been unchanged. There has been no fever. Associated symptoms include hearing loss. Pertinent negatives include no coughing, ear discharge, headaches, rhinorrhea or sore throat. She has tried nothing for the symptoms.   Reviewed past Medical, Social and Family history today.  Outpatient Medications Prior to Visit  Medication Sig Dispense Refill  . acetaminophen (TYLENOL) 500 MG tablet Take 1 tablet (500 mg total) by mouth every 6 (six) hours as needed. 30 tablet 0  . albuterol (PROVENTIL HFA;VENTOLIN HFA) 108 (90 Base) MCG/ACT inhaler Inhale 1-2 puffs into the lungs every 6 (six) hours as needed for wheezing or shortness of breath. 1 Inhaler 3  . amLODipine (NORVASC) 10 MG tablet Take 1 tablet (10 mg total) by mouth at bedtime. 90 tablet 3  . fluticasone (FLONASE) 50 MCG/ACT nasal spray SHAKE LIQUID AND USE 2 SPRAYS IN EACH NOSTRIL DAILY 16 g 1  . fluticasone-salmeterol (ADVAIR HFA) 115-21 MCG/ACT inhaler Inhale 2 puffs into the lungs 2 (two) times daily. Rinse mouth after each use 1 Inhaler 11  . metroNIDAZOLE (METROGEL VAGINAL) 0.75 % vaginal gel Place 1 Applicatorful vaginally at bedtime. 70 g 0  . montelukast (SINGULAIR) 10 MG tablet Take 1 tablet (10 mg total) by mouth at bedtime. 90 tablet 3   No facility-administered medications prior to visit.     ROS See HPI  Objective:  There were no vitals taken for this visit.  BP Readings from Last 3 Encounters:  10/21/18 134/80  07/15/18 (!) 160/80  06/15/18 124/82    Wt Readings from Last 3 Encounters:  10/21/18 239 lb 3.2 oz (108.5 kg)  07/15/18 239 lb (108.4 kg)  06/15/18 240 lb (108.9 kg)    Physical Exam Vitals signs reviewed.  HENT:       Right Ear: External ear normal. There is impacted cerumen. No mastoid tenderness.     Left Ear: External ear normal. There is impacted cerumen. No mastoid tenderness.     Ears:     Comments: Right TM intact and ear canal normal post cerumen removal. Neurological:     Mental Status: She is alert.     Lab Results  Component Value Date   WBC 4.9 10/21/2018   HGB 11.5 (L) 10/21/2018   HCT 35.5 (L) 10/21/2018   PLT 265.0 10/21/2018   GLUCOSE 94 10/21/2018   CHOL 192 10/21/2018   TRIG 102.0 10/21/2018   HDL 63.10 10/21/2018   LDLCALC 109 (H) 10/21/2018   ALT 14 10/21/2018   AST 17 10/21/2018   NA 137 10/21/2018   K 4.7 10/21/2018   CL 106 10/21/2018   CREATININE 0.78 10/21/2018   BUN 26 (H) 10/21/2018   CO2 24 10/21/2018   TSH 1.20 10/21/2018   HGBA1C 6.1 10/21/2018   MICROALBUR <0.7 10/21/2018   Procedure Note :     Procedure :  Ear irrigation (bilateral)   Indication:  Cerumen impaction (bilateral)   Risks, including pain, dizziness, eardrum perforation, bleeding, infection and others as well as benefits were explained to the patient in detail. Verbal consent was obtained and the patient agreed to proceed.    We used "The Elephant Ear Irrigation Device" filled with lukewarm water for  irrigation. A large amount wax was recovered from right ear (hard and dry). Procedure has also required manual wax removal with an ear loop.   Tolerated well. Complications: unable to remove wax from left ear. Cerumen was dry and located in posterior of ear canal. Unable to dislodge with curette and irrigation device. Removal from right ear was successful without any complication. Postprocedure instructions :  Return to office in 5days after use of debrox in left ear.   Assessment & Plan:   [name] was seen today for ear fullness.  Diagnoses and all orders for this visit:  Hearing loss due to cerumen impaction, left -     carbamide peroxide (DEBROX) 6.5 % OTIC solution; Place 5 drops  into the left ear at bedtime. Start on Friday -     Ambulatory referral to ENT   I am having Sudie Bailey start on carbamide peroxide. I am also having her maintain her acetaminophen, albuterol, fluticasone, montelukast, amLODipine, fluticasone-salmeterol, and metroNIDAZOLE.  Meds ordered this encounter  Medications  . carbamide peroxide (DEBROX) 6.5 % OTIC solution    Sig: Place 5 drops into the left ear at bedtime. Start on Friday    Dispense:  15 mL    Refill:  0    Order Specific Question:   Supervising Provider    Answer:   MATTHEWS, CODY [[id number]]    Problem List Items Addressed This Visit    None    Visit Diagnoses    Hearing loss due to cerumen impaction, left    -  Primary   Relevant Medications   carbamide peroxide (DEBROX) 6.5 % OTIC solution   Other Relevant Orders   Ambulatory referral to ENT       Follow-up: Return in about 6 days (around 11/02/2018) for ear irrigation.  Wilfred Lacy, NP

## 2018-10-28 ENCOUNTER — Encounter: Payer: Self-pay | Admitting: Nurse Practitioner

## 2018-10-29 ENCOUNTER — Encounter: Payer: Self-pay | Admitting: Gastroenterology

## 2018-11-02 ENCOUNTER — Ambulatory Visit: Payer: BLUE CROSS/BLUE SHIELD | Admitting: Nurse Practitioner

## 2018-11-02 NOTE — Addendum Note (Signed)
Addended by: Wilfred Lacy L on: 11/02/2018 11:24 AM   Modules accepted: Orders

## 2018-11-18 ENCOUNTER — Ambulatory Visit: Payer: BLUE CROSS/BLUE SHIELD

## 2018-11-18 VITALS — Ht 68.5 in | Wt 238.0 lb

## 2018-11-18 DIAGNOSIS — Z1211 Encounter for screening for malignant neoplasm of colon: Secondary | ICD-10-CM

## 2018-11-18 MED ORDER — NA SULFATE-K SULFATE-MG SULF 17.5-3.13-1.6 GM/177ML PO SOLN: 1.0000 | Freq: Once | ORAL | 0 refills | Status: AC

## 2018-11-18 NOTE — Progress Notes (Signed)
No egg or soy allergy known to patient  No issues with past sedation with any surgeries  or procedures, no intubation problems  No diet pills per patient No home 02 use per patient  No blood thinners per patient  Pt denies issues with constipation  No A fib or A flutter  EMMI video sent to pt's e mail . Pt accepted

## 2018-11-19 ENCOUNTER — Encounter: Payer: Self-pay | Admitting: Gastroenterology

## 2018-11-25 DIAGNOSIS — H9012 Conductive hearing loss, unilateral, left ear, with unrestricted hearing on the contralateral side: Secondary | ICD-10-CM | POA: Insufficient documentation

## 2018-11-25 DIAGNOSIS — J343 Hypertrophy of nasal turbinates: Secondary | ICD-10-CM | POA: Diagnosis not present

## 2018-11-25 DIAGNOSIS — Z7289 Other problems related to lifestyle: Secondary | ICD-10-CM | POA: Diagnosis not present

## 2018-11-25 DIAGNOSIS — H6122 Impacted cerumen, left ear: Secondary | ICD-10-CM | POA: Insufficient documentation

## 2018-12-01 ENCOUNTER — Encounter: Payer: Self-pay | Admitting: Gastroenterology

## 2018-12-01 ENCOUNTER — Ambulatory Visit (AMBULATORY_SURGERY_CENTER): Payer: BLUE CROSS/BLUE SHIELD | Admitting: Gastroenterology

## 2018-12-01 VITALS — BP 124/47 | HR 66 | Temp 96.9°F | Resp 13 | Ht 68.0 in | Wt 239.0 lb

## 2018-12-01 DIAGNOSIS — D120 Benign neoplasm of cecum: Secondary | ICD-10-CM | POA: Diagnosis not present

## 2018-12-01 DIAGNOSIS — Z1211 Encounter for screening for malignant neoplasm of colon: Secondary | ICD-10-CM

## 2018-12-01 MED ORDER — SODIUM CHLORIDE 0.9 % IV SOLN: 500.0000 mL | Freq: Once | INTRAVENOUS | Status: DC

## 2018-12-01 NOTE — Patient Instructions (Signed)
Please read handouts provided. Continue present medications. Await pathology results.     YOU HAD AN ENDOSCOPIC PROCEDURE TODAY AT THE [facility]:   Refer to the procedure report that was given to you for any specific questions about what was found during the examination.  If the procedure report does not answer your questions, please call your gastroenterologist to clarify.  If you requested that your care partner not be given the details of your procedure findings, then the procedure report has been included in a sealed envelope for you to review at your convenience later.  YOU SHOULD EXPECT: Some feelings of bloating in the abdomen. Passage of more gas than usual.  Walking can help get rid of the air that was put into your GI tract during the procedure and reduce the bloating. If you had a lower endoscopy (such as a colonoscopy or flexible sigmoidoscopy) you may notice spotting of blood in your stool or on the toilet paper. If you underwent a bowel prep for your procedure, you may not have a normal bowel movement for a few days.  Please Note:  You might notice some irritation and congestion in your nose or some drainage.  This is from the oxygen used during your procedure.  There is no need for concern and it should clear up in a day or so.  SYMPTOMS TO REPORT IMMEDIATELY:   Following lower endoscopy (colonoscopy or flexible sigmoidoscopy):  Excessive amounts of blood in the stool  Significant tenderness or worsening of abdominal pains  Swelling of the abdomen that is new, acute  Fever of 100F or higher    For urgent or emergent issues, a gastroenterologist can be reached at any hour by calling [phone].   DIET:  We do recommend a small meal at first, but then you may proceed to your regular diet.  Drink plenty of fluids but you should avoid alcoholic beverages for 24 hours.  ACTIVITY:  You should plan to take it easy for the rest of today and you should NOT DRIVE  or use heavy machinery until tomorrow (because of the sedation medicines used during the test).    FOLLOW UP: Our staff will call the number listed on your records the next business day following your procedure to check on you and address any questions or concerns that you may have regarding the information given to you following your procedure. If we do not reach you, we will leave a message.  However, if you are feeling well and you are not experiencing any problems, there is no need to return our call.  We will assume that you have returned to your regular daily activities without incident.  If any biopsies were taken you will be contacted by phone or by letter within the next 1-3 weeks.  Please call us at [phone] if you have not heard about the biopsies in 3 weeks.    SIGNATURES/CONFIDENTIALITY: You and/or your care partner have signed paperwork which will be entered into your electronic medical record.  These signatures attest to the fact that that the information above on your After Visit Summary has been reviewed and is understood.  Full responsibility of the confidentiality of this discharge information lies with you and/or your care-partner. 

## 2018-12-01 NOTE — Progress Notes (Signed)
Called to room to assist during endoscopic procedure.  Patient ID and intended procedure confirmed with present staff. Received instructions for my participation in the procedure from the performing physician.  

## 2018-12-01 NOTE — Progress Notes (Signed)
Pt's states no medical or surgical changes since previsit or office visit. 

## 2018-12-01 NOTE — Op Note (Signed)
[place] Patient Name: Stacy Chapman Procedure Date: 12/01/2018 10:58 AM MRN: [id number] Endoscopist: Quebrada. Loletha Carrow , MD Age: 63 Referring MD:  Date of Birth: 1956-02-04 Gender: Female Account #: [id number] Procedure:                Colonoscopy Indications:              Screening for colorectal malignant neoplasm, This                            is the patient's first colonoscopy Medicines:                Monitored Anesthesia Care Procedure:                Pre-Anesthesia Assessment:                           - Prior to the procedure, a History and Physical                            was performed, and patient medications and                            allergies were reviewed. The patient's tolerance of                            previous anesthesia was also reviewed. The risks                            and benefits of the procedure and the sedation                            options and risks were discussed with the patient.                            All questions were answered, and informed consent                            was obtained. Anticoagulants: The patient has taken                            aspirin. It was decided not to withhold this                            medication prior to the procedure. ASA Grade                            Assessment: III - A patient with severe systemic                            disease. After reviewing the risks and benefits,                            the patient was deemed in satisfactory condition to  undergo the procedure.                           After obtaining informed consent, the colonoscope                            was passed under direct vision. Throughout the                            procedure, the patient's blood pressure, pulse, and                            oxygen saturations were monitored continuously. The                            Colonoscope was introduced through the anus  and                            advanced to the the cecum, identified by                            appendiceal orifice and ileocecal valve. The                            colonoscopy was performed without difficulty. The                            patient tolerated the procedure well. The quality                            of the bowel preparation was excellent. The                            ileocecal valve, appendiceal orifice, and rectum                            were photographed. Scope In: 11:07:38 AM Scope Out: 11:21:11 AM Scope Withdrawal Time: 0 hours 10 minutes 55 seconds  Total Procedure Duration: 0 hours 13 minutes 33 seconds  Findings:                 The perianal and digital rectal examinations were                            normal.                           A diminutive polyp was found in the cecum. The                            polyp was sessile. The polyp was removed with a                            cold snare. Resection and retrieval were complete.  The exam was otherwise without abnormality on                            direct and retroflexion views. Complications:            No immediate complications. Estimated Blood Loss:     Estimated blood loss was minimal. Impression:               - One diminutive polyp in the cecum, removed with a                            cold snare. Resected and retrieved.                           - The examination was otherwise normal on direct                            and retroflexion views. Recommendation:           - Patient has a contact number available for                            emergencies. The signs and symptoms of potential                            delayed complications were discussed with the                            patient. Return to normal activities tomorrow.                            Written discharge instructions were provided to the                            patient.                            - Resume previous diet.                           - Continue present medications.                           - Await pathology results.                           - Repeat colonoscopy is recommended for                            surveillance. The colonoscopy date will be                            determined after pathology results from today's                            exam become available for review.  L. Loletha Carrow, MD 12/01/2018 11:24:06 AM This report has been signed  electronically.

## 2018-12-01 NOTE — Progress Notes (Signed)
To PACU, VSS. Report to [email or web address].tb 

## 2018-12-02 ENCOUNTER — Telehealth: Payer: Self-pay

## 2018-12-02 NOTE — Telephone Encounter (Signed)
  Follow up Call-  Call back number 12/01/2018  Post procedure Call Back phone  # [phone] -cell  Permission to leave phone message Yes  Some recent data might be hidden     Patient questions:  Do you have a fever, pain , or abdominal swelling? No. Pain Score  0 *  Have you tolerated food without any problems? Yes.    Have you been able to return to your normal activities? Yes.    Do you have any questions about your discharge instructions: Diet   No. Medications  No. Follow up visit  No.  Do you have questions or concerns about your Care? No.  Actions: * If pain score is 4 or above: No action needed, pain <4.

## 2018-12-06 ENCOUNTER — Encounter: Payer: Self-pay | Admitting: Gastroenterology

## 2019-02-21 ENCOUNTER — Encounter (HOSPITAL_COMMUNITY): Payer: Self-pay | Admitting: Emergency Medicine

## 2019-02-21 ENCOUNTER — Other Ambulatory Visit: Payer: Self-pay

## 2019-02-21 ENCOUNTER — Emergency Department (HOSPITAL_COMMUNITY)
Admission: EM | Admit: 2019-02-21 | Discharge: 2019-02-21 | Disposition: A | Discharge status: Home / Self Care | Payer: BLUE CROSS/BLUE SHIELD | Attending: Emergency Medicine | Admitting: Emergency Medicine

## 2019-02-21 DIAGNOSIS — M546 Pain in thoracic spine: Secondary | ICD-10-CM | POA: Insufficient documentation

## 2019-02-21 DIAGNOSIS — I4891 Unspecified atrial fibrillation: Secondary | ICD-10-CM | POA: Diagnosis not present

## 2019-02-21 DIAGNOSIS — Z8709 Personal history of other diseases of the respiratory system: Secondary | ICD-10-CM | POA: Diagnosis not present

## 2019-02-21 DIAGNOSIS — Z79899 Other long term (current) drug therapy: Secondary | ICD-10-CM | POA: Insufficient documentation

## 2019-02-21 DIAGNOSIS — M545 Low back pain, unspecified: Secondary | ICD-10-CM

## 2019-02-21 DIAGNOSIS — I10 Essential (primary) hypertension: Secondary | ICD-10-CM | POA: Insufficient documentation

## 2019-02-21 DIAGNOSIS — Y939 Activity, unspecified: Secondary | ICD-10-CM | POA: Insufficient documentation

## 2019-02-21 DIAGNOSIS — Z7982 Long term (current) use of aspirin: Secondary | ICD-10-CM | POA: Insufficient documentation

## 2019-02-21 DIAGNOSIS — Z041 Encounter for examination and observation following transport accident: Secondary | ICD-10-CM | POA: Diagnosis not present

## 2019-02-21 DIAGNOSIS — E119 Type 2 diabetes mellitus without complications: Secondary | ICD-10-CM | POA: Insufficient documentation

## 2019-02-21 DIAGNOSIS — Y929 Unspecified place or not applicable: Secondary | ICD-10-CM | POA: Diagnosis not present

## 2019-02-21 DIAGNOSIS — Y999 Unspecified external cause status: Secondary | ICD-10-CM | POA: Insufficient documentation

## 2019-02-21 MED ORDER — METHOCARBAMOL 500 MG PO TABS: 500.0000 mg | ORAL_TABLET | Freq: Two times a day (BID) | ORAL | 0 refills | Status: DC

## 2019-02-21 MED ORDER — HYDROCODONE-ACETAMINOPHEN 5-325 MG PO TABS
1.0000 | ORAL_TABLET | Freq: Once | ORAL | Status: AC
  Administered 2019-02-21: 15:00:00 1 via ORAL
  Filled 2019-02-21: qty 1
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
## 2019-02-21 NOTE — ED Notes (Signed)
Patient verbalizes understanding of discharge instructions. Opportunity for questioning and answers were provided. Armband removed by staff, pt discharged from ED. Wheeled out to lobby  

## 2019-02-23 ENCOUNTER — Ambulatory Visit (INDEPENDENT_AMBULATORY_CARE_PROVIDER_SITE_OTHER): Payer: BLUE CROSS/BLUE SHIELD | Admitting: Nurse Practitioner

## 2019-02-23 ENCOUNTER — Encounter: Payer: Self-pay | Admitting: Nurse Practitioner

## 2019-02-23 DIAGNOSIS — M545 Low back pain, unspecified: Secondary | ICD-10-CM

## 2019-02-23 MED ORDER — TRAMADOL HCL 50 MG PO TABS: 50.0000 mg | ORAL_TABLET | Freq: Two times a day (BID) | ORAL | 0 refills | Status: AC | PRN

## 2019-02-23 MED ORDER — NAPROXEN 500 MG PO TABS: 500.0000 mg | ORAL_TABLET | Freq: Two times a day (BID) | ORAL | 0 refills | Status: DC
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
## 2019-02-23 NOTE — Patient Instructions (Signed)
Use tramadol only for severe pain Use naproxen and robaxin BID x 3days, then as needed. Stop tylenol and aspirin. Continue use of hot or cold compress as needed

## 2019-04-05 ENCOUNTER — Telehealth: Payer: Self-pay | Admitting: Nurse Practitioner

## 2019-04-05 NOTE — Telephone Encounter (Signed)
I called and left message on patient voicemail to call office and reschedule appointment scheduled for 04/21/2019 with Wilfred Lacy, provider not in office.

## 2019-04-21 ENCOUNTER — Ambulatory Visit: Payer: BLUE CROSS/BLUE SHIELD | Admitting: Nurse Practitioner

## 2019-05-25 ENCOUNTER — Other Ambulatory Visit: Payer: Self-pay

## 2019-05-25 ENCOUNTER — Encounter: Payer: Self-pay | Admitting: Nurse Practitioner

## 2019-05-25 ENCOUNTER — Ambulatory Visit (INDEPENDENT_AMBULATORY_CARE_PROVIDER_SITE_OTHER): Payer: BC Managed Care – PPO | Admitting: Nurse Practitioner

## 2019-05-25 VITALS — BP 132/82 | HR 72 | Temp 97.3°F | Ht 68.0 in | Wt 236.8 lb

## 2019-05-25 DIAGNOSIS — E119 Type 2 diabetes mellitus without complications: Secondary | ICD-10-CM | POA: Diagnosis not present

## 2019-05-25 DIAGNOSIS — M549 Dorsalgia, unspecified: Secondary | ICD-10-CM

## 2019-05-25 DIAGNOSIS — I10 Essential (primary) hypertension: Secondary | ICD-10-CM

## 2019-05-25 LAB — HEMOGLOBIN A1C: Hgb A1c MFr Bld: 6.1 % (ref 4.6–6.5)

## 2019-05-25 LAB — BASIC METABOLIC PANEL
BUN: 28 mg/dL — ABNORMAL HIGH (ref 6–23)
CO2: 23 mEq/L (ref 19–32)
Calcium: 9.6 mg/dL (ref 8.4–10.5)
Chloride: 106 mEq/L (ref 96–112)
Creatinine, Ser: 0.76 mg/dL (ref 0.40–1.20)
GFR: 93.1 mL/min (ref >60.00)
Glucose, Bld: 96 mg/dL (ref 70–99)
Potassium: 4.5 mEq/L (ref 3.5–5.1)
Sodium: 136 mEq/L (ref 135–145)

## 2019-05-25 MED ORDER — METHOCARBAMOL 500 MG PO TABS: 500.0000 mg | ORAL_TABLET | Freq: Two times a day (BID) | ORAL | 0 refills | Status: DC

## 2019-05-25 MED ORDER — NAPROXEN 500 MG PO TABS: 500.0000 mg | ORAL_TABLET | Freq: Two times a day (BID) | ORAL | 0 refills | Status: DC | PRN
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
## 2019-05-25 NOTE — Patient Instructions (Addendum)
Go to lab for blood draw.  encourage weight loss through diet and exercise (walking 30-50mns a day).  Do back stretching exercise daily. Let me know if you change your about referral to PT.  Back Exercises The following exercises strengthen the muscles that help to support the trunk and back. They also help to keep the lower back flexible. Doing these exercises can help to prevent back pain or lessen existing pain.  If you have back pain or discomfort, try doing these exercises 2-3 times each day or as told by your health care provider.  As your pain improves, do them once each day, but increase the number of times that you repeat the steps for each exercise (do more repetitions).  To prevent the recurrence of back pain, continue to do these exercises once each day or as told by your health care provider. Do exercises exactly as told by your health care provider and adjust them as directed. It is normal to feel mild stretching, pulling, tightness, or discomfort as you do these exercises, but you should stop right away if you feel sudden pain or your pain gets worse. Exercises Single knee to chest Repeat these steps 3-5 times for each leg: 1. Lie on your back on a firm bed or the floor with your legs extended. 2. Bring one knee to your chest. Your other leg should stay extended and in contact with the floor. 3. Hold your knee in place by grabbing your knee or thigh with both hands and hold. 4. Pull on your knee until you feel a gentle stretch in your lower back or buttocks. 5. Hold the stretch for 10-30 seconds. 6. Slowly release and straighten your leg. Pelvic tilt Repeat these steps 5-10 times: 1. Lie on your back on a firm bed or the floor with your legs extended. 2. Bend your knees so they are pointing toward the ceiling and your feet are flat on the floor. 3. Tighten your lower abdominal muscles to press your lower back against the floor. This motion will tilt your pelvis so your  tailbone points up toward the ceiling instead of pointing to your feet or the floor. 4. With gentle tension and even breathing, hold this position for 5-10 seconds. Cat-cow Repeat these steps until your lower back becomes more flexible: 1. Get into a hands-and-knees position on a firm surface. Keep your hands under your shoulders, and keep your knees under your hips. You may place padding under your knees for comfort. 2. Let your head hang down toward your chest. Contract your abdominal muscles and point your tailbone toward the floor so your lower back becomes rounded like the back of a cat. 3. Hold this position for 5 seconds. 4. Slowly lift your head, let your abdominal muscles relax and point your tailbone up toward the ceiling so your back forms a sagging arch like the back of a cow. 5. Hold this position for 5 seconds.  Press-ups Repeat these steps 5-10 times: 1. Lie on your abdomen (face-down) on the floor. 2. Place your palms near your head, about shoulder-width apart. 3. Keeping your back as relaxed as possible and keeping your hips on the floor, slowly straighten your arms to raise the top half of your body and lift your shoulders. Do not use your back muscles to raise your upper torso. You may adjust the placement of your hands to make yourself more comfortable. 4. Hold this position for 5 seconds while you keep your back relaxed. 5.  Slowly return to lying flat on the floor.  Bridges Repeat these steps 10 times: 1. Lie on your back on a firm surface. 2. Bend your knees so they are pointing toward the ceiling and your feet are flat on the floor. Your arms should be flat at your sides, next to your body. 3. Tighten your buttocks muscles and lift your buttocks off the floor until your waist is at almost the same height as your knees. You should feel the muscles working in your buttocks and the back of your thighs. If you do not feel these muscles, slide your feet 1-2 inches farther away  from your buttocks. 4. Hold this position for 3-5 seconds. 5. Slowly lower your hips to the starting position, and allow your buttocks muscles to relax completely. If this exercise is too easy, try doing it with your arms crossed over your chest. Abdominal crunches Repeat these steps 5-10 times: 1. Lie on your back on a firm bed or the floor with your legs extended. 2. Bend your knees so they are pointing toward the ceiling and your feet are flat on the floor. 3. Cross your arms over your chest. 4. Tip your chin slightly toward your chest without bending your neck. 5. Tighten your abdominal muscles and slowly raise your trunk (torso) high enough to lift your shoulder blades a tiny bit off the floor. Avoid raising your torso higher than that because it can put too much stress on your low back and does not help to strengthen your abdominal muscles. 6. Slowly return to your starting position. Back lifts Repeat these steps 5-10 times: 1. Lie on your abdomen (face-down) with your arms at your sides, and rest your forehead on the floor. 2. Tighten the muscles in your legs and your buttocks. 3. Slowly lift your chest off the floor while you keep your hips pressed to the floor. Keep the back of your head in line with the curve in your back. Your eyes should be looking at the floor. 4. Hold this position for 3-5 seconds. 5. Slowly return to your starting position. Contact a health care provider if:  Your back pain or discomfort gets much worse when you do an exercise.  Your worsening back pain or discomfort does not lessen within 2 hours after you exercise. If you have any of these problems, stop doing these exercises right away. Do not do them again unless your health care provider says that you can. Get help right away if:  You develop sudden, severe back pain. If this happens, stop doing the exercises right away. Do not do them again unless your health care provider says that you can. This  information is not intended to replace advice given to you by your health care provider. Make sure you discuss any questions you have with your health care provider. Document Released: 11/06/2004 Document Revised: 02/03/2019 Document Reviewed: 07/01/2018 Elsevier Patient Education  [place] Injury Prevention Back injuries can be very painful. They can also be difficult to heal. After having one back injury, you are more likely to have another one again. It is important to learn how to avoid injuring or re-injuring your back. The following tips can help you to prevent a back injury. What actions can I take to prevent back injuries? Changes in your diet Talk with your doctor about what to eat. Some foods can make the bones strong.  Talk with your doctor about how much calcium and vitamin D  you need each day. These nutrients help to prevent weakening of the bones (osteoporosis).  Eat foods that have calcium. These include: ? Dairy products. ? Green leafy vegetables. ? Food and drinks that have had calcium added to them (fortified).  Eat foods that have vitamin D. These include: ? Milk. ? Food and drinks that have had vitamin D added to them.  Take other supplements and vitamins only as told by your doctor. Physical fitness Physical fitness makes your bones and muscles strong. It also improves your balance and strength.  Exercise for 30 minutes per day on most days of the week, or as told by your doctor. Make sure to: ? Do aerobic exercises, such as walking, jogging, biking, or swimming. ? Do exercises that increase balance and strength, such as tai chi and yoga. ? Do stretching exercises. This helps with flexibility. ? Develop strong belly (abdominal) muscles. Your belly muscles help to support your back.  Stay at a healthy weight. This lowers your risk of a back injury. Good posture        Prevent back injuries by developing and maintaining a good posture. To do  this:  Sit up straight and stand up straight. Avoid leaning forward when you sit or hunching over when you stand.  Choose chairs that have good low-back (lumbar) support.  If you work at a desk: ? Sit close to it so you do not need to lean over. ? Keep your chin tucked in. ? Keep your neck drawn back. ? Keep your elbows bent so that your arms make a corner (right angle).  When you drive: ? Sit high and close to the steering wheel. Add a low-back support to your car seat, if needed. ? Take breaks every hour if you are driving for long periods of time.  Avoid sitting or standing in one position for very long. Take breaks to get up, stretch, and walk around at least once every hour.  Sleep on your side with your knees slightly bent, or sleep on your back with a pillow under your knees.  Lifting, twisting, and reaching   Heavy lifting ? Avoid heavy lifting, especially lifting over and over again. If you must do heavy lifting: ? Stretch before lifting. ? Work slowly. ? Rest between lifts. ? Use a tool such as a cart or a dolly to move objects if one is available. ? Make several small trips instead of carrying one heavy load. ? Ask for help when you need it, especially when moving big objects. ? Follow these steps when lifting: ? Stand with your feet shoulder-width apart. ? Get as close to the object as you can. Do not pick up a heavy object that is far from your body. ? Use handles or lifting straps if they are available. ? Bend at your knees. Squat down, but keep your heels off the floor. ? Keep your shoulders back. Keep your chin tucked in. Keep your back straight. ? Lift the object slowly while you tighten the muscles in your legs, belly, and bottom. Keep the object as close to the center of your body as possible. ? Follow these steps when putting down a heavy load: ? Stand with your feet shoulder-width apart. ? Lower the object slowly while you tighten the muscles in your legs,  belly, and bottom. Keep the object as close to the center of your body as possible. ? Keep your shoulders back. Keep your chin tucked in. Keep your back straight. ?  Bend at your knees. Squat down, but keep your heels off the floor. ? Use handles or lifting straps if they are available.  Twisting and reaching ? Avoid lifting heavy objects above your waist. ? Do not twist at your waist while you are lifting or carrying a load. If you need to turn, move your feet. ? Do not bend over without bending at your knees. ? Avoid reaching over your head, across a table, or for an object on a high surface. Other things to do   Avoid wet floors and icy ground. Keep sidewalks clear of ice to prevent falls.  Do not sleep on a mattress that is too soft or too hard.  Store heavier objects on shelves at waist level.  Store lighter objects on lower or higher shelves.  Find ways to lower your stress, such as: ? Exercise. ? Massage. ? Relaxation techniques.  Talk with your doctor if you feel anxious or depressed. These conditions can make back pain worse.  Wear flat heel shoes with cushioned soles.  Use both shoulder straps when carrying a backpack.  Do not use any products that contain nicotine or tobacco, such as cigarettes and e-cigarettes. If you need help quitting, ask your doctor. Summary  Back injuries can be very painful and difficult to heal.  You can keep your back healthy by making certain changes. These include eating foods that make bones strong, working on being physically fit, developing a good posture, and lifting heavy objects in a safe way. This information is not intended to replace advice given to you by your health care provider. Make sure you discuss any questions you have with your health care provider. Document Released: 03/17/2008 Document Revised: 11/20/2017 Document Reviewed: 11/20/2017 Elsevier Patient Education  2020 [employer].

## 2019-06-23 ENCOUNTER — Other Ambulatory Visit: Payer: Self-pay | Admitting: Nurse Practitioner

## 2019-06-23 DIAGNOSIS — M549 Dorsalgia, unspecified: Secondary | ICD-10-CM

## 2019-08-16 DIAGNOSIS — Z03818 Encounter for observation for suspected exposure to other biological agents ruled out: Secondary | ICD-10-CM | POA: Diagnosis not present

## 2019-08-16 DIAGNOSIS — Z20828 Contact with and (suspected) exposure to other viral communicable diseases: Secondary | ICD-10-CM | POA: Diagnosis not present

## 2019-08-21 ENCOUNTER — Emergency Department (HOSPITAL_COMMUNITY): Payer: BC Managed Care – PPO

## 2019-08-21 ENCOUNTER — Emergency Department (HOSPITAL_COMMUNITY)
Admission: EM | Admit: 2019-08-21 | Discharge: 2019-08-21 | Disposition: A | Discharge status: Home / Self Care | Payer: BC Managed Care – PPO | Attending: Emergency Medicine | Admitting: Emergency Medicine

## 2019-08-21 ENCOUNTER — Other Ambulatory Visit: Payer: Self-pay

## 2019-08-21 ENCOUNTER — Encounter (HOSPITAL_COMMUNITY): Payer: Self-pay | Admitting: Emergency Medicine

## 2019-08-21 DIAGNOSIS — Z7901 Long term (current) use of anticoagulants: Secondary | ICD-10-CM | POA: Diagnosis not present

## 2019-08-21 DIAGNOSIS — E119 Type 2 diabetes mellitus without complications: Secondary | ICD-10-CM | POA: Diagnosis not present

## 2019-08-21 DIAGNOSIS — Z7982 Long term (current) use of aspirin: Secondary | ICD-10-CM | POA: Insufficient documentation

## 2019-08-21 DIAGNOSIS — J45909 Unspecified asthma, uncomplicated: Secondary | ICD-10-CM | POA: Insufficient documentation

## 2019-08-21 DIAGNOSIS — Z8673 Personal history of transient ischemic attack (TIA), and cerebral infarction without residual deficits: Secondary | ICD-10-CM | POA: Diagnosis not present

## 2019-08-21 DIAGNOSIS — Z79899 Other long term (current) drug therapy: Secondary | ICD-10-CM | POA: Insufficient documentation

## 2019-08-21 DIAGNOSIS — I4891 Unspecified atrial fibrillation: Secondary | ICD-10-CM | POA: Diagnosis not present

## 2019-08-21 DIAGNOSIS — R0602 Shortness of breath: Secondary | ICD-10-CM | POA: Diagnosis not present

## 2019-08-21 DIAGNOSIS — I491 Atrial premature depolarization: Secondary | ICD-10-CM | POA: Diagnosis not present

## 2019-08-21 DIAGNOSIS — R069 Unspecified abnormalities of breathing: Secondary | ICD-10-CM | POA: Diagnosis not present

## 2019-08-21 DIAGNOSIS — I499 Cardiac arrhythmia, unspecified: Secondary | ICD-10-CM | POA: Diagnosis not present

## 2019-08-21 LAB — CBC
HCT: 40.5 % (ref 36.0–46.0)
Hemoglobin: 12.7 g/dL (ref 12.0–15.0)
MCH: 27.9 pg (ref 26.0–34.0)
MCHC: 31.4 g/dL (ref 30.0–36.0)
MCV: 88.8 fL (ref 80.0–100.0)
Platelets: 212 10*3/uL (ref 150–400)
RBC: 4.56 MIL/uL (ref 3.87–5.11)
RDW: 13.6 % (ref 11.5–15.5)
WBC: 5.9 10*3/uL (ref 4.0–10.5)
nRBC: 0 % (ref 0.0–0.2)

## 2019-08-21 LAB — COMPREHENSIVE METABOLIC PANEL
ALT: 25 U/L (ref 0–44)
AST: 26 U/L (ref 15–41)
Albumin: 3.3 g/dL — ABNORMAL LOW (ref 3.5–5.0)
Alkaline Phosphatase: 48 U/L (ref 38–126)
Anion gap: 8 (ref 5–15)
BUN: 22 mg/dL (ref 8–23)
CO2: 20 mmol/L — ABNORMAL LOW (ref 22–32)
Calcium: 8.5 mg/dL — ABNORMAL LOW (ref 8.9–10.3)
Chloride: 109 mmol/L (ref 98–111)
Creatinine, Ser: 0.97 mg/dL (ref 0.44–1.00)
GFR calc Af Amer: >60 mL/min (ref >60)
GFR calc non Af Amer: >60 mL/min (ref >60)
Glucose, Bld: 99 mg/dL (ref 70–99)
Potassium: 3.8 mmol/L (ref 3.5–5.1)
Sodium: 137 mmol/L (ref 135–145)
Total Bilirubin: 0.8 mg/dL (ref 0.3–1.2)
Total Protein: 7.6 g/dL (ref 6.5–8.1)

## 2019-08-21 LAB — MAGNESIUM: Magnesium: 1.5 mg/dL — ABNORMAL LOW (ref 1.7–2.4)

## 2019-08-21 LAB — TROPONIN I (HIGH SENSITIVITY)
Troponin I (High Sensitivity): 6 ng/L (ref <18)
Troponin I (High Sensitivity): 6 ng/L (ref <18)

## 2019-08-21 LAB — TSH: TSH: 0.659 u[IU]/mL (ref 0.350–4.500)

## 2019-08-21 MED ORDER — MAGNESIUM GLUCONATE 30 MG PO TABS: 30.0000 mg | ORAL_TABLET | Freq: Two times a day (BID) | ORAL | 0 refills | Status: DC

## 2019-08-21 MED ORDER — RIVAROXABAN 20 MG PO TABS
20.0000 mg | ORAL_TABLET | Freq: Every day | ORAL | Status: DC
  Administered 2019-08-21: 20 mg via ORAL
  Filled 2019-08-21: qty 1

## 2019-08-21 MED ORDER — MAGNESIUM SULFATE 2 GM/50ML IV SOLN
2.0000 g | Freq: Once | INTRAVENOUS | Status: AC
  Administered 2019-08-21: 19:00:00 2 g via INTRAVENOUS
  Filled 2019-08-21: qty 50

## 2019-08-21 MED ORDER — RIVAROXABAN 20 MG PO TABS: 20.0000 mg | ORAL_TABLET | Freq: Every day | ORAL | 0 refills | Status: DC

## 2019-08-21 NOTE — Discharge Instructions (Addendum)
Watch for your heart racing.  Follow-up with the atrial fibrillation clinic.

## 2019-08-21 NOTE — ED Triage Notes (Signed)
[facility]- pt here for SOB and new onset afib. Pt was at the store when she felt SOB. HR fluctuating between 60-120. Pt also states she felt dizzy    HR 60-120, newq onset afib

## 2019-08-21 NOTE — ED Provider Notes (Signed)
[facility] EMERGENCY DEPARTMENT Provider Note   CSN: [id number] Arrival date & time: 08/21/19  1524     History   Chief Complaint No chief complaint on file.   HPI Stacy Chapman is a 63 y.o. female.     HPI Patient presents with shortness of breath.  Began at the store.  States she thinks it is because she was wearing a different mask.  States she does not normally do well with the masks on.  Found to have atrial fibrillation by EMS.  Still feeling somewhat short of breath but better than she was before now she has that mask off.  No chest pain.  Patient denies history of atrial fibrillation but reviewing records appears to have had a previously.  Had previously been on Xarelto but is not on it now.  Has some mild swelling in both legs that is not unusual for her.  Occasional drink an energy drink but has had none in the last few days.  No chest pain.  No cough. Past Medical History:  Diagnosis Date  . Anemia   . Arthritis   . Asthma   . Stroke [facility]) 2018   TIA    Patient Active Problem List   Diagnosis Date Noted  . Mild intermittent asthma with acute exacerbation 10/22/2018  . Paroxysmal atrial fibrillation ([place]) 07/19/2018  . Bronchitis 03/05/2017  . HTN (hypertension), benign 01/22/2017  . Elevated liver enzymes 01/22/2017  . Chronic pansinusitis 01/22/2017  . DM (diabetes mellitus) ([place]) 01/22/2017  . Abnormal head CT 01/22/2017    Past Surgical History:  Procedure Laterality Date  . KNEE ARTHROSCOPY    . TONSILLECTOMY    . TUBAL LIGATION  1991     OB History   No obstetric history on file.      Home Medications    Prior to Admission medications   Medication Sig Start Date End Date Taking? Authorizing Provider  albuterol (PROVENTIL HFA;VENTOLIN HFA) 108 (90 Base) MCG/ACT inhaler Inhale 1-2 puffs into the lungs every 6 (six) hours as needed for wheezing or shortness of breath. 06/15/18  Yes Nche, Charlene Brooke, NP  amLODipine  (NORVASC) 10 MG tablet Take 1 tablet (10 mg total) by mouth at bedtime. 10/21/18  Yes Nche, Charlene Brooke, NP  aspirin EC 81 MG tablet Take 81 mg by mouth daily.   Yes [provider]  fluticasone (FLONASE) 50 MCG/ACT nasal spray SHAKE LIQUID AND USE 2 SPRAYS IN EACH NOSTRIL DAILY Patient taking differently: Place 2 sprays into both nostrils daily as needed for allergies.  09/14/18  Yes Nche, Charlene Brooke, NP  fluticasone-salmeterol (ADVAIR HFA) 115-21 MCG/ACT inhaler Inhale 2 puffs into the lungs 2 (two) times daily. Rinse mouth after each use 10/21/18  Yes Nche, Charlene Brooke, NP  montelukast (SINGULAIR) 10 MG tablet Take 1 tablet (10 mg total) by mouth at bedtime. 10/21/18  Yes Nche, Charlene Brooke, NP  magnesium gluconate (MAGONATE) 30 MG tablet Take 1 tablet (30 mg total) by mouth 2 (two) times daily. 08/21/19   Davonna Belling, MD  methocarbamol (ROBAXIN) 500 MG tablet Take 1 tablet (500 mg total) by mouth 2 (two) times daily. Patient not taking: Reported on 08/21/2019 05/25/19   Nche, Charlene Brooke, NP  rivaroxaban (XARELTO) 20 MG TABS tablet Take 1 tablet (20 mg total) by mouth daily with supper. 08/21/19   Davonna Belling, MD    Family History Family History  Problem Relation Age of Onset  . Stroke Father 79  death  . Hypertension Father   . Dementia Sister   . Diabetes Sister   . Eczema Sister   . Hypertension Brother   . Stroke Brother 45       death  . Cancer Brother        lung  . Cancer Paternal Uncle        unknown  . Dementia Sister   . Colon cancer Neg Hx   . Colon polyps Neg Hx   . Stomach cancer Neg Hx   . Rectal cancer Neg Hx   . Esophageal cancer Neg Hx     Social History Social History   Tobacco Use  . Smoking status: Never Smoker  . Smokeless tobacco: Never Used  Substance Use Topics  . Alcohol use: Yes    Alcohol/week: 5.0 standard drinks    Types: 5 Standard drinks or equivalent per week    Comment: Drinks multiple times a week  . Drug  use: No     Allergies   Lisinopril   Review of Systems Review of Systems  Constitutional: Negative for appetite change.  HENT: Negative for congestion.   Respiratory: Positive for shortness of breath.   Cardiovascular: Negative for palpitations.  Gastrointestinal: Negative for abdominal pain.  Genitourinary: Negative for frequency.  Musculoskeletal: Negative for back pain.  Neurological: Negative for weakness.  Psychiatric/Behavioral: Negative for confusion.     Physical Exam Updated Vital Signs BP (!) 147/87   Pulse 78   Temp 97.9 F (36.6 C) (Oral)   Resp 16   Ht 5\' 7"  (1.702 m)   Wt 108.9 kg   SpO2 100%   BMI 37.59 kg/m   Physical Exam Nursing note reviewed.  HENT:     Head: Atraumatic.  Eyes:     Pupils: Pupils are equal, round, and reactive to light.  Cardiovascular:     Rate and Rhythm: Normal rate. Rhythm irregular.  Pulmonary:     Breath sounds: No wheezing, rhonchi or rales.  Abdominal:     Tenderness: There is no guarding.  Musculoskeletal:     Right lower leg: Edema present.     Left lower leg: Edema present.     Comments: Mild edema bilateral lower extremities.  Skin:    Capillary Refill: Capillary refill takes less than 2 seconds.  Neurological:     Mental Status: She is alert and oriented to person, place, and time.      ED Treatments / Results  Labs (all labs ordered are listed, but only abnormal results are displayed) Labs Reviewed  COMPREHENSIVE METABOLIC PANEL - Abnormal; Notable for the following components:      Result Value   CO2 20 (*)    Calcium 8.5 (*)    Albumin 3.3 (*)    All other components within normal limits  MAGNESIUM - Abnormal; Notable for the following components:   Magnesium 1.5 (*)    All other components within normal limits  CBC  TSH  TROPONIN I (HIGH SENSITIVITY)  TROPONIN I (HIGH SENSITIVITY)    EKG EKG Interpretation  Date/Time:  Sunday August 21 2019 15:32:20 EST Ventricular Rate:  97 PR  Interval:    QRS Duration: 87 QT Interval:  370 QTC Calculation: 446 R Axis:   24 Text Interpretation: Atrial fibrillation Nonspecific T abnrm, anterolateral leads Confirmed by Davonna Belling [phone]) on 08/21/2019 3:40:21 PM   Radiology Dg Chest Portable 1 View  Result Date: 08/21/2019 CLINICAL DATA:  Shortness of breath EXAM: PORTABLE CHEST 1 VIEW  COMPARISON:  01/15/2017 FINDINGS: Cardiomegaly. Both lungs are clear. The visualized skeletal structures are unremarkable. IMPRESSION: Cardiomegaly without acute abnormality of the lungs in AP portable projection. Electronically Signed   By: Eddie Candle M.D.   On: 08/21/2019 16:32    Procedures Procedures (including critical care time)  Medications Ordered in ED Medications  rivaroxaban (XARELTO) tablet 20 mg (20 mg Oral Given 08/21/19 1911)  magnesium sulfate IVPB 2 g 50 mL (0 g Intravenous Stopped 08/21/19 2010)     Initial Impression / Assessment and Plan / ED Course  I have reviewed the triage vital signs and the nursing notes.  Pertinent labs & imaging results that were available during my care of the patient were reviewed by me and considered in my medical decision making (see chart for details).        Patient presents with atrial fibrillation. Patient states new onset, but does appear to have a history of it. Chadsvasc of 5. Rate controlled. Mag supplemented. Will d/c and have follow up with Afib clinic. Not a candidate for ED cardioversion since unknown onset.  Final Clinical Impressions(s) / ED Diagnoses   Final diagnoses:  Atrial fibrillation, unspecified type ([place])  Hypomagnesemia    ED Discharge Orders         Ordered    magnesium gluconate (MAGONATE) 30 MG tablet  2 times daily     08/21/19 2007    rivaroxaban (XARELTO) 20 MG TABS tablet  Daily with supper     08/21/19 2008           Davonna Belling, MD 08/21/19 2011

## 2019-08-21 NOTE — ED Notes (Signed)
Patient verbalizes understanding of discharge instructions. Opportunity for questioning and answers were provided. Armband removed by staff, pt discharged from ED ambulatory to home.  

## 2019-08-22 ENCOUNTER — Encounter: Payer: Self-pay | Admitting: Nurse Practitioner

## 2019-08-22 DIAGNOSIS — I480 Paroxysmal atrial fibrillation: Secondary | ICD-10-CM

## 2019-08-22 MED ORDER — MAGNESIUM GLUCONATE 500 (27 MG) MG PO TABS: 1.0000 | ORAL_TABLET | Freq: Two times a day (BID) | ORAL | 0 refills | Status: DC

## 2019-08-22 NOTE — Telephone Encounter (Signed)
Please contact pharmacy and inquire which dose they have

## 2019-08-23 ENCOUNTER — Ambulatory Visit (HOSPITAL_COMMUNITY)
Admission: RE | Admit: 2019-08-23 | Discharge: 2019-08-23 | Disposition: A | Discharge status: Home / Self Care | Payer: BC Managed Care – PPO | Source: Ambulatory Visit | Attending: Physician Assistant | Admitting: Physician Assistant

## 2019-08-23 ENCOUNTER — Other Ambulatory Visit: Payer: Self-pay

## 2019-08-23 ENCOUNTER — Encounter (HOSPITAL_COMMUNITY): Payer: Self-pay | Admitting: Physician Assistant

## 2019-08-23 VITALS — BP 150/76 | HR 79 | Ht 67.0 in | Wt 238.0 lb

## 2019-08-23 DIAGNOSIS — Z8249 Family history of ischemic heart disease and other diseases of the circulatory system: Secondary | ICD-10-CM | POA: Insufficient documentation

## 2019-08-23 DIAGNOSIS — I480 Paroxysmal atrial fibrillation: Secondary | ICD-10-CM | POA: Insufficient documentation

## 2019-08-23 DIAGNOSIS — D6869 Other thrombophilia: Secondary | ICD-10-CM | POA: Insufficient documentation

## 2019-08-23 DIAGNOSIS — Z79899 Other long term (current) drug therapy: Secondary | ICD-10-CM | POA: Insufficient documentation

## 2019-08-23 DIAGNOSIS — E118 Type 2 diabetes mellitus with unspecified complications: Secondary | ICD-10-CM | POA: Insufficient documentation

## 2019-08-23 DIAGNOSIS — J45909 Unspecified asthma, uncomplicated: Secondary | ICD-10-CM | POA: Diagnosis not present

## 2019-08-23 DIAGNOSIS — Z8673 Personal history of transient ischemic attack (TIA), and cerebral infarction without residual deficits: Secondary | ICD-10-CM | POA: Insufficient documentation

## 2019-08-23 DIAGNOSIS — I4819 Other persistent atrial fibrillation: Secondary | ICD-10-CM | POA: Insufficient documentation

## 2019-08-23 DIAGNOSIS — Z7901 Long term (current) use of anticoagulants: Secondary | ICD-10-CM | POA: Diagnosis not present

## 2019-08-23 DIAGNOSIS — I10 Essential (primary) hypertension: Secondary | ICD-10-CM | POA: Insufficient documentation

## 2019-08-23 NOTE — Patient Instructions (Signed)
Stop aspirin  Please use magnesium over the counter once a day

## 2019-08-23 NOTE — Progress Notes (Signed)
Primary Care Physician: Lorayne Marek Charlene Brooke, NP Primary Cardiologist: none Primary Electrophysiologist: none Referring Physician: [facility]   Stacy Chapman is a 63 y.o. female with a history of HTN, DM, CVA, asthma, and paroxysmal atrial fibrillation who presents for consultation in the [facility].  The patient was initially diagnosed with atrial fibrillation remotely and had previously been on Xarelto per ER report. More recently, patient was seen at the ER 08/21/19 with symptoms of SOB and generalized weakness and was found by EMS to be in afib. She did not undergo DCCV as onset of afib was unclear and she was not on anticoagulation. She was discharged on Xarelto for a CHADS2VASC score of 5. Patient reports that she had two close family members pass away the day before her symptom onset. She does admit to alcohol use. She denies significant snoring.  Today, she denies symptoms of palpitations, chest pain, shortness of breath, orthopnea, PND, lower extremity edema, dizziness, presyncope, syncope, snoring, daytime somnolence, bleeding, or neurologic sequela. The patient is tolerating medications without difficulties and is otherwise without complaint today.    Atrial Fibrillation Risk Factors:  she does not have symptoms or diagnosis of sleep apnea. she does not have a history of rheumatic fever. she does have a history of alcohol use. The patient does not have a history of early familial atrial fibrillation or other arrhythmias.  she has a BMI of Body mass index is 37.28 kg/m.Marland Kitchen Filed Weights   08/23/19 0959  Weight: 108 kg    Family History  Problem Relation Age of Onset  . Stroke Father 64       death  . Hypertension Father   . Dementia Sister   . Diabetes Sister   . Eczema Sister   . Hypertension Brother   . Stroke Brother 14       death  . Cancer Brother        lung  . Cancer Paternal Uncle        unknown  . Dementia Sister   .  Colon cancer Neg Hx   . Colon polyps Neg Hx   . Stomach cancer Neg Hx   . Rectal cancer Neg Hx   . Esophageal cancer Neg Hx      Atrial Fibrillation Management history:  Previous antiarrhythmic drugs: none Previous cardioversions: none Previous ablations: none CHADS2VASC score: 5 Anticoagulation history: Xarelto   Past Medical History:  Diagnosis Date  . Anemia   . Arthritis   . Asthma   . Stroke [facility]) 2018   TIA   Past Surgical History:  Procedure Laterality Date  . KNEE ARTHROSCOPY    . TONSILLECTOMY    . TUBAL LIGATION  1991    Current Outpatient Medications  Medication Sig Dispense Refill  . amLODipine (NORVASC) 10 MG tablet Take 1 tablet (10 mg total) by mouth at bedtime. 90 tablet 3  . fluticasone (FLONASE) 50 MCG/ACT nasal spray SHAKE LIQUID AND USE 2 SPRAYS IN EACH NOSTRIL DAILY (Patient taking differently: Place 2 sprays into both nostrils daily as needed for allergies. ) 16 g 1  . fluticasone-salmeterol (ADVAIR HFA) 115-21 MCG/ACT inhaler Inhale 2 puffs into the lungs 2 (two) times daily. Rinse mouth after each use 1 Inhaler 11  . Magnesium Gluconate 500 (27 Mg) MG TABS Take 1 tablet (500 mg total) by mouth 2 (two) times daily with a meal. 6 tablet 0  . methocarbamol (ROBAXIN) 500 MG tablet Take 1 tablet (500  mg total) by mouth 2 (two) times daily. 30 tablet 0  . montelukast (SINGULAIR) 10 MG tablet Take 1 tablet (10 mg total) by mouth at bedtime. 90 tablet 3  . rivaroxaban (XARELTO) 20 MG TABS tablet Take 1 tablet (20 mg total) by mouth daily with supper. 30 tablet 0   No current facility-administered medications for this encounter.     Allergies  Allergen Reactions  . Lisinopril Cough    Social History   Socioeconomic History  . Marital status: Legally Separated    Spouse name: Not on file  . Number of children: Not on file  . Years of education: Not on file  . Highest education level: Not on file  Occupational History  . Not on file  Social  Needs  . Financial resource strain: Not on file  . Food insecurity    Worry: Not on file    Inability: Not on file  . Transportation needs    Medical: Not on file    Non-medical: Not on file  Tobacco Use  . Smoking status: Never Smoker  . Smokeless tobacco: Never Used  Substance and Sexual Activity  . Alcohol use: Not Currently    Alcohol/week: 5.0 standard drinks    Types: 5 Standard drinks or equivalent per week    Comment: Drinks multiple times a week  . Drug use: No  . Sexual activity: Yes    Birth control/protection: None  Lifestyle  . Physical activity    Days per week: Not on file    Minutes per session: Not on file  . Stress: Not on file  Relationships  . Social Herbalist on phone: Not on file    Gets together: Not on file    Attends religious service: Not on file    Active member of club or organization: Not on file    Attends meetings of clubs or organizations: Not on file    Relationship status: Not on file  . Intimate partner violence    Fear of current or ex partner: Not on file    Emotionally abused: Not on file    Physically abused: Not on file    Forced sexual activity: Not on file  Other Topics Concern  . Not on file  Social History Narrative  . Not on file     ROS- All systems are reviewed and negative except as per the HPI above.  Physical Exam: Vitals:   08/23/19 0959  BP: (!) 150/76  Pulse: 79  SpO2: 99%  Weight: 108 kg  Height: 5\' 7"  (1.702 m)    GEN- The patient is well appearing obese female, alert and oriented x 3 today.   Head- normocephalic, atraumatic Eyes-  Sclera clear, conjunctiva pink Ears- hearing intact Oropharynx- clear Neck- supple  Lungs- Clear to ausculation bilaterally, normal work of breathing Heart- Regular rate and rhythm, no murmurs, rubs or gallops  GI- soft, NT, ND, + BS Extremities- no clubbing, cyanosis, or edema MS- no significant deformity or atrophy Skin- no rash or lesion Psych- euthymic  mood, full affect Neuro- strength and sensation are intact  Wt Readings from Last 3 Encounters:  08/23/19 108 kg  08/21/19 108.9 kg  05/25/19 107.4 kg    EKG today demonstrates SR HR 79, PAC, PR 138, QRS 80, QTc 419   Epic records are reviewed at length today  Assessment and Plan:  1. Paroxysmal atrial fibrillation General eduction about afib provided and questions answered.  We also  discussed her stroke risk and the risks and benefits of anticoagulation. ? Triggered by recent death of family members. Will continue Xarelto 20 mg daily Check echocardiogram Stop ASA  This patients CHA2DS2-VASc Score and unadjusted Ischemic Stroke Rate (% per year) is equal to 7.2 % stroke rate/year from a score of 5  Above score calculated as 1 point each if present [CHF, HTN, DM, Vascular=MI/PAD/Aortic Plaque, Age if 65-74, or Female] Above score calculated as 2 points each if present [Age > 75, or Stroke/TIA/TE]   2. Obesity Body mass index is 37.28 kg/m. Lifestyle modification was discussed at length including regular exercise and weight reduction.  3. HTN Elevated today, possibly anxious about her appointment today. Will reassess at follow up.   Follow up in the AF clinic in one month.   [facility] [address] [place], [place] [address] [phone] 08/23/2019 10:47 AM

## 2019-08-25 ENCOUNTER — Ambulatory Visit (HOSPITAL_COMMUNITY): Admission: RE | Admit: 2019-08-25 | Payer: BC Managed Care – PPO | Source: Ambulatory Visit

## 2019-09-12 ENCOUNTER — Other Ambulatory Visit: Payer: Self-pay

## 2019-09-12 ENCOUNTER — Ambulatory Visit (HOSPITAL_COMMUNITY)
Admission: RE | Admit: 2019-09-12 | Discharge: 2019-09-12 | Disposition: A | Discharge status: Home / Self Care | Payer: BC Managed Care – PPO | Source: Ambulatory Visit | Attending: Physician Assistant | Admitting: Physician Assistant

## 2019-09-12 DIAGNOSIS — Z8673 Personal history of transient ischemic attack (TIA), and cerebral infarction without residual deficits: Secondary | ICD-10-CM | POA: Insufficient documentation

## 2019-09-12 DIAGNOSIS — I081 Rheumatic disorders of both mitral and tricuspid valves: Secondary | ICD-10-CM | POA: Insufficient documentation

## 2019-09-12 DIAGNOSIS — I480 Paroxysmal atrial fibrillation: Secondary | ICD-10-CM

## 2019-09-12 NOTE — Progress Notes (Signed)
  Echocardiogram 2D Echocardiogram has been performed.  Jennette Dubin 09/12/2019, 11:38 AM

## 2019-09-13 ENCOUNTER — Telehealth: Payer: Self-pay | Admitting: Nurse Practitioner

## 2019-09-13 DIAGNOSIS — J309 Allergic rhinitis, unspecified: Secondary | ICD-10-CM

## 2019-09-13 DIAGNOSIS — J324 Chronic pansinusitis: Secondary | ICD-10-CM

## 2019-09-13 DIAGNOSIS — J4521 Mild intermittent asthma with (acute) exacerbation: Secondary | ICD-10-CM

## 2019-09-13 MED ORDER — FLUTICASONE-SALMETEROL 115-21 MCG/ACT IN AERO: 2.0000 | INHALATION_SPRAY | Freq: Two times a day (BID) | RESPIRATORY_TRACT | 5 refills | Status: DC

## 2019-09-13 MED ORDER — FLUTICASONE PROPIONATE 50 MCG/ACT NA SUSP: NASAL | 1 refills | Status: DC

## 2019-09-13 NOTE — Telephone Encounter (Signed)
Stacy Chapman please advise, ok to send this in, last refill was 09/2018--pt is using this as needed for allergy.

## 2019-09-13 NOTE — Telephone Encounter (Signed)
Spoke with the pharmacy--they stated that paper work we faxed to [facility] [employer] to change from Advair HFA 115-21 mcg to Vardaman 250-50 mcg for lower cost for pt is not cover by the insurance now.   They request brand name Advair HFA 115-21 mcg or Advair HFA 250-50 mcg (this will cover per pharmacy) send in; need new rx send in because rx closed after we sent in [place] by fax.   Stacy Chapman advise, which mcg you want me to send in for the pt?

## 2019-09-13 NOTE — Telephone Encounter (Signed)
Rx sent. Pt aware.  

## 2019-09-13 NOTE — Addendum Note (Signed)
Addended byShawnie Pons on: 09/13/2019 04:04 PM   Modules accepted: Orders

## 2019-09-13 NOTE — Telephone Encounter (Signed)
This was sent 10/21/2018 with 11 refills. Contact pharmacy

## 2019-09-13 NOTE — Telephone Encounter (Signed)
Rx sent for Advair HFA 115-21 mcg.

## 2019-09-13 NOTE — Telephone Encounter (Signed)
Ok to refill 

## 2019-09-13 NOTE — Telephone Encounter (Signed)
fluticasone-salmeterol (ADVAIR HFA) 115-21 MCG/ACT inhaler     Patient requesting 90 day supply refill.     Pharmacy:  [place], [place] - [address] [phone] (Phone) [phone] (Fax)

## 2019-09-13 NOTE — Telephone Encounter (Signed)
Oh sorry, advise the pt that we sent in Advair and pt should have some refill left.   Pt flonase--last refill was 09/2018--pt stated she is using as needed for allergy. Please advise.

## 2019-09-13 NOTE — Telephone Encounter (Signed)
Ok to send advair 115-28mcg

## 2019-09-22 ENCOUNTER — Other Ambulatory Visit: Payer: Self-pay

## 2019-09-22 ENCOUNTER — Ambulatory Visit (HOSPITAL_COMMUNITY)
Admission: RE | Admit: 2019-09-22 | Discharge: 2019-09-22 | Disposition: A | Discharge status: Home / Self Care | Payer: BC Managed Care – PPO | Source: Ambulatory Visit | Attending: Physician Assistant | Admitting: Physician Assistant

## 2019-09-22 ENCOUNTER — Encounter (HOSPITAL_COMMUNITY): Payer: Self-pay | Admitting: Physician Assistant

## 2019-09-22 VITALS — BP 120/78 | HR 106 | Ht 67.0 in | Wt 237.2 lb

## 2019-09-22 DIAGNOSIS — D6869 Other thrombophilia: Secondary | ICD-10-CM | POA: Diagnosis not present

## 2019-09-22 DIAGNOSIS — Z6837 Body mass index (BMI) 37.0-37.9, adult: Secondary | ICD-10-CM | POA: Insufficient documentation

## 2019-09-22 DIAGNOSIS — M1990 Unspecified osteoarthritis, unspecified site: Secondary | ICD-10-CM | POA: Diagnosis not present

## 2019-09-22 DIAGNOSIS — J45909 Unspecified asthma, uncomplicated: Secondary | ICD-10-CM | POA: Insufficient documentation

## 2019-09-22 DIAGNOSIS — Z8673 Personal history of transient ischemic attack (TIA), and cerebral infarction without residual deficits: Secondary | ICD-10-CM | POA: Insufficient documentation

## 2019-09-22 DIAGNOSIS — I480 Paroxysmal atrial fibrillation: Secondary | ICD-10-CM

## 2019-09-22 DIAGNOSIS — Z79899 Other long term (current) drug therapy: Secondary | ICD-10-CM | POA: Insufficient documentation

## 2019-09-22 DIAGNOSIS — E669 Obesity, unspecified: Secondary | ICD-10-CM | POA: Diagnosis not present

## 2019-09-22 DIAGNOSIS — E119 Type 2 diabetes mellitus without complications: Secondary | ICD-10-CM | POA: Insufficient documentation

## 2019-09-22 DIAGNOSIS — Z7901 Long term (current) use of anticoagulants: Secondary | ICD-10-CM | POA: Diagnosis not present

## 2019-09-22 DIAGNOSIS — Z7951 Long term (current) use of inhaled steroids: Secondary | ICD-10-CM | POA: Diagnosis not present

## 2019-09-22 DIAGNOSIS — I10 Essential (primary) hypertension: Secondary | ICD-10-CM | POA: Diagnosis not present

## 2019-09-22 MED ORDER — DILTIAZEM HCL ER COATED BEADS 180 MG PO CP24: 180.0000 mg | ORAL_CAPSULE | Freq: Every day | ORAL | 3 refills | Status: DC

## 2019-09-22 NOTE — Progress Notes (Signed)
Primary Care Physician: Lorayne Marek Charlene Brooke, NP Primary Cardiologist: none Primary Electrophysiologist: none Referring Physician: [facility]   Stacy Chapman is a 63 y.o. female with a history of HTN, DM, CVA, asthma, and paroxysmal atrial fibrillation who presents for consultation in the [facility].  The patient was initially diagnosed with atrial fibrillation remotely and had previously been on Xarelto per ER report. More recently, patient was seen at the ER 08/21/19 with symptoms of SOB and generalized weakness and was found by EMS to be in afib. She did not undergo DCCV as onset of afib was unclear and she was not on anticoagulation. She was discharged on Xarelto for a CHADS2VASC score of 5. Patient reports that she had two close family members pass away the day before her symptom onset. She does admit to alcohol use. She denies significant snoring.  On follow up today, patient reports that she has done well since her last visit. She has not had any heart racing or palpitations. She is surprised to hear she is in afib today. She does not have any symptoms previously associated with her afib. There were no triggers that the patient could identify. She admits that she has missed several doses of Xarelto.  Today, she denies symptoms of palpitations, chest pain, shortness of breath, orthopnea, PND, lower extremity edema, dizziness, presyncope, syncope, snoring, daytime somnolence, bleeding, or neurologic sequela. The patient is tolerating medications without difficulties and is otherwise without complaint today.    Atrial Fibrillation Risk Factors:  she does not have symptoms or diagnosis of sleep apnea. she does not have a history of rheumatic fever. she does have a history of alcohol use. The patient does not have a history of early familial atrial fibrillation or other arrhythmias.  she has a BMI of Body mass index is 37.15 kg/m.Marland Kitchen Filed Weights   09/22/19 0942  Weight: 107.6 kg    Family History  Problem Relation Age of Onset  . Stroke Father 54       death  . Hypertension Father   . Dementia Sister   . Diabetes Sister   . Eczema Sister   . Hypertension Brother   . Stroke Brother 30       death  . Cancer Brother        lung  . Cancer Paternal Uncle        unknown  . Dementia Sister   . Colon cancer Neg Hx   . Colon polyps Neg Hx   . Stomach cancer Neg Hx   . Rectal cancer Neg Hx   . Esophageal cancer Neg Hx      Atrial Fibrillation Management history:  Previous antiarrhythmic drugs: none Previous cardioversions: none Previous ablations: none CHADS2VASC score: 5 Anticoagulation history: Xarelto   Past Medical History:  Diagnosis Date  . Anemia   . Arthritis   . Asthma   . Stroke [facility]) 2018   TIA   Past Surgical History:  Procedure Laterality Date  . KNEE ARTHROSCOPY    . TONSILLECTOMY    . TUBAL LIGATION  1991    Current Outpatient Medications  Medication Sig Dispense Refill  . amLODipine (NORVASC) 10 MG tablet Take 1 tablet (10 mg total) by mouth at bedtime. 90 tablet 3  . fluticasone (FLONASE) 50 MCG/ACT nasal spray SHAKE LIQUID AND USE 2 SPRAYS IN EACH NOSTRIL DAILY 16 g 1  . fluticasone-salmeterol (ADVAIR HFA) 115-21 MCG/ACT inhaler Inhale 2 puffs into the lungs 2 (  two) times daily. Rinse mouth after each use 1 Inhaler 5  . Magnesium Gluconate 500 (27 Mg) MG TABS Take 1 tablet (500 mg total) by mouth 2 (two) times daily with a meal. 6 tablet 0  . montelukast (SINGULAIR) 10 MG tablet Take 1 tablet (10 mg total) by mouth at bedtime. 90 tablet 3  . rivaroxaban (XARELTO) 20 MG TABS tablet Take 1 tablet (20 mg total) by mouth daily with supper. 30 tablet 0   No current facility-administered medications for this encounter.    Allergies  Allergen Reactions  . Lisinopril Cough    Social History   Socioeconomic History  . Marital status: Divorced    Spouse name: Not on file  . Number of  children: Not on file  . Years of education: Not on file  . Highest education level: Not on file  Occupational History  . Not on file  Tobacco Use  . Smoking status: Never Smoker  . Smokeless tobacco: Never Used  Substance and Sexual Activity  . Alcohol use: Not Currently    Alcohol/week: 6.0 standard drinks    Types: 5 Standard drinks or equivalent, 1 Glasses of wine per week    Comment: Drinks multiple times a week  . Drug use: No  . Sexual activity: Yes    Birth control/protection: None  Other Topics Concern  . Not on file  Social History Narrative  . Not on file   Social Determinants of Health   Financial Resource Strain:   . Difficulty of Paying Living Expenses: Not on file  Food Insecurity:   . Worried About Charity fundraiser in the Last Year: Not on file  . Ran Out of Food in the Last Year: Not on file  Transportation Needs:   . Lack of Transportation (Medical): Not on file  . Lack of Transportation (Non-Medical): Not on file  Physical Activity:   . Days of Exercise per Week: Not on file  . Minutes of Exercise per Session: Not on file  Stress:   . Feeling of Stress : Not on file  Social Connections:   . Frequency of Communication with Friends and Family: Not on file  . Frequency of Social Gatherings with Friends and Family: Not on file  . Attends Religious Services: Not on file  . Active Member of Clubs or Organizations: Not on file  . Attends Archivist Meetings: Not on file  . Marital Status: Not on file  Intimate Partner Violence:   . Fear of Current or Ex-Partner: Not on file  . Emotionally Abused: Not on file  . Physically Abused: Not on file  . Sexually Abused: Not on file     ROS- All systems are reviewed and negative except as per the HPI above.  Physical Exam: Vitals:   09/22/19 0942  BP: 120/78  Pulse: (!) 106  Weight: 107.6 kg  Height: 5\' 7"  (1.702 m)    GEN- The patient is well appearing obese female, alert and oriented x 3  today.   HEENT-head normocephalic, atraumatic, sclera clear, conjunctiva pink, hearing intact, trachea midline. Lungs- Clear to ausculation bilaterally, normal work of breathing Heart- irregular rate and rhythm, no murmurs, rubs or gallops  GI- soft, NT, ND, + BS Extremities- no clubbing, cyanosis, or edema MS- no significant deformity or atrophy Skin- no rash or lesion Psych- euthymic mood, full affect Neuro- strength and sensation are intact   Wt Readings from Last 3 Encounters:  09/22/19 107.6 kg  08/23/19  108 kg  08/21/19 108.9 kg    EKG today demonstrates afib HR 106, PVC, QRS 76, QTc 456  Echo 09/12/19  1. Left ventricular ejection fraction, by visual estimation, is 55 to 60%. The left ventricle has normal function. There is no left ventricular hypertrophy.  2. Left ventricular diastolic parameters are indeterminate.  3. Global right ventricle has normal systolic function.The right ventricular size is normal. No increase in right ventricular wall thickness.  4. Left atrial size was mildly dilated.  5. Right atrial size was normal.  6. The mitral valve is normal in structure. Moderate mitral valve regurgitation. No evidence of mitral stenosis.  7. The tricuspid valve is normal in structure. Tricuspid valve regurgitation moderate.  8. The aortic valve is normal in structure. Aortic valve regurgitation is not visualized. No evidence of aortic valve sclerosis or stenosis.  9. The pulmonic valve was normal in structure. Pulmonic valve regurgitation is mild. 10. Mildly elevated pulmonary artery systolic pressure. 11. The inferior vena cava is normal in size with greater than 50% respiratory variability, suggesting right atrial pressure of 3 mmHg.   Epic records are reviewed at length today  Assessment and Plan:  1. Paroxysmal atrial fibrillation Patient back in afib today, unclear duration as patient is asymptomatic. We discussed therapeutic options today.  Will place 7 day  Zio patch to assess afib burden. If persistent, can consider DCCV and/or AAD.  Will stop amlodipine and start diltiazem 180 mg daily for rate control. Will continue Xarelto 20 mg daily. Stressed importance of taking daily. If DCCV indicated, will need to be on uninterrupted anticoagulation for 3 weeks.  This patients CHA2DS2-VASc Score and unadjusted Ischemic Stroke Rate (% per year) is equal to 7.2 % stroke rate/year from a score of 5  Above score calculated as 1 point each if present [CHF, HTN, DM, Vascular=MI/PAD/Aortic Plaque, Age if 65-74, or Female] Above score calculated as 2 points each if present [Age > 75, or Stroke/TIA/TE]   2. Obesity Body mass index is 37.15 kg/m. Lifestyle modification was discussed and encouraged including regular physical activity and weight reduction.  3. HTN Stable, med changes as above.   Follow up in the AF clinic in 3 weeks.   [facility] [address] [place], [place] [address] [phone] 09/22/2019 9:52 AM

## 2019-09-22 NOTE — Patient Instructions (Signed)
Stop Amlodipine medication Start Cardizem CD 180mg  daily

## 2019-09-29 ENCOUNTER — Other Ambulatory Visit (HOSPITAL_COMMUNITY): Payer: Self-pay | Admitting: *Deleted

## 2019-09-29 MED ORDER — RIVAROXABAN 20 MG PO TABS: 20.0000 mg | ORAL_TABLET | Freq: Every day | ORAL | 6 refills | Status: DC

## 2019-10-03 ENCOUNTER — Encounter: Payer: Self-pay | Admitting: Nurse Practitioner

## 2019-10-10 NOTE — Addendum Note (Signed)
Encounter addended by: Micki Riley, RN on: 10/10/2019 11:23 AM  Actions taken: Imaging Exam ended

## 2019-10-17 ENCOUNTER — Ambulatory Visit (HOSPITAL_COMMUNITY)
Admission: RE | Admit: 2019-10-17 | Discharge: 2019-10-17 | Disposition: A | Discharge status: Home / Self Care | Payer: BC Managed Care – PPO | Source: Ambulatory Visit | Attending: Physician Assistant | Admitting: Physician Assistant

## 2019-10-17 ENCOUNTER — Other Ambulatory Visit: Payer: Self-pay

## 2019-10-17 VITALS — BP 146/78 | HR 83 | Ht 67.0 in | Wt 235.5 lb

## 2019-10-17 DIAGNOSIS — Z8673 Personal history of transient ischemic attack (TIA), and cerebral infarction without residual deficits: Secondary | ICD-10-CM | POA: Diagnosis not present

## 2019-10-17 DIAGNOSIS — Z8249 Family history of ischemic heart disease and other diseases of the circulatory system: Secondary | ICD-10-CM | POA: Diagnosis not present

## 2019-10-17 DIAGNOSIS — Z823 Family history of stroke: Secondary | ICD-10-CM | POA: Diagnosis not present

## 2019-10-17 DIAGNOSIS — I10 Essential (primary) hypertension: Secondary | ICD-10-CM | POA: Insufficient documentation

## 2019-10-17 DIAGNOSIS — Z6836 Body mass index (BMI) 36.0-36.9, adult: Secondary | ICD-10-CM | POA: Insufficient documentation

## 2019-10-17 DIAGNOSIS — Z7901 Long term (current) use of anticoagulants: Secondary | ICD-10-CM | POA: Insufficient documentation

## 2019-10-17 DIAGNOSIS — Z79899 Other long term (current) drug therapy: Secondary | ICD-10-CM | POA: Insufficient documentation

## 2019-10-17 DIAGNOSIS — E669 Obesity, unspecified: Secondary | ICD-10-CM | POA: Diagnosis not present

## 2019-10-17 DIAGNOSIS — D6869 Other thrombophilia: Secondary | ICD-10-CM

## 2019-10-17 DIAGNOSIS — I472 Ventricular tachycardia: Secondary | ICD-10-CM | POA: Insufficient documentation

## 2019-10-17 DIAGNOSIS — J45909 Unspecified asthma, uncomplicated: Secondary | ICD-10-CM | POA: Insufficient documentation

## 2019-10-17 DIAGNOSIS — I4819 Other persistent atrial fibrillation: Secondary | ICD-10-CM | POA: Diagnosis not present

## 2019-10-17 DIAGNOSIS — E119 Type 2 diabetes mellitus without complications: Secondary | ICD-10-CM | POA: Insufficient documentation

## 2019-10-17 MED ORDER — FLECAINIDE ACETATE 50 MG PO TABS: 50.0000 mg | ORAL_TABLET | Freq: Two times a day (BID) | ORAL | 1 refills | Status: DC

## 2019-10-17 NOTE — Patient Instructions (Signed)
Start Flecainide 50mg twice daily 

## 2019-10-17 NOTE — Progress Notes (Signed)
Primary Care Physician: Lorayne Marek Charlene Brooke, NP Primary Cardiologist: none Primary Electrophysiologist: none Referring Physician: [facility]   Stacy Chapman is a 64 y.o. female with a history of HTN, DM, CVA, asthma, and paroxysmal atrial fibrillation who presents for consultation in the [facility].  The patient was initially diagnosed with atrial fibrillation remotely and had previously been on Xarelto per ER report. More recently, patient was seen at the ER 08/21/19 with symptoms of SOB and generalized weakness and was found by EMS to be in afib. She did not undergo DCCV as onset of afib was unclear and she was not on anticoagulation. She was discharged on Xarelto for a CHADS2VASC score of 5. Patient reports that she had two close family members pass away the day before her symptom onset. She does admit to alcohol use. She denies significant snoring.  On follow up today, patient wore a Zio patch which showed 100% afib burden which was rate controlled overall. She also had one episode of VT which was 12 beats. She denies any family history of sudden cardiac death and has no symptoms of syncope.   Today, she denies symptoms of palpitations, chest pain, shortness of breath, orthopnea, PND, lower extremity edema, dizziness, presyncope, syncope, snoring, daytime somnolence, bleeding, or neurologic sequela. The patient is tolerating medications without difficulties and is otherwise without complaint today.    Atrial Fibrillation Risk Factors:  she does not have symptoms or diagnosis of sleep apnea. she does not have a history of rheumatic fever. she does have a history of alcohol use. The patient does not have a history of early familial atrial fibrillation or other arrhythmias.  she has a BMI of Body mass index is 36.88 kg/m.Marland Kitchen Filed Weights   10/17/19 1041  Weight: 106.8 kg    Family History  Problem Relation Age of Onset  . Stroke Father 34   death  . Hypertension Father   . Dementia Sister   . Diabetes Sister   . Eczema Sister   . Hypertension Brother   . Stroke Brother 58       death  . Cancer Brother        lung  . Cancer Paternal Uncle        unknown  . Dementia Sister   . Colon cancer Neg Hx   . Colon polyps Neg Hx   . Stomach cancer Neg Hx   . Rectal cancer Neg Hx   . Esophageal cancer Neg Hx      Atrial Fibrillation Management history:  Previous antiarrhythmic drugs: none Previous cardioversions: none Previous ablations: none CHADS2VASC score: 5 Anticoagulation history: Xarelto   Past Medical History:  Diagnosis Date  . Anemia   . Arthritis   . Asthma   . Stroke [facility]) 2018   TIA   Past Surgical History:  Procedure Laterality Date  . KNEE ARTHROSCOPY    . TONSILLECTOMY    . TUBAL LIGATION  1991    Current Outpatient Medications  Medication Sig Dispense Refill  . diltiazem (CARDIZEM CD) 180 MG 24 hr capsule Take 1 capsule (180 mg total) by mouth daily. 30 capsule 3  . fluticasone (FLONASE) 50 MCG/ACT nasal spray SHAKE LIQUID AND USE 2 SPRAYS IN EACH NOSTRIL DAILY 16 g 1  . fluticasone-salmeterol (ADVAIR HFA) 115-21 MCG/ACT inhaler Inhale 2 puffs into the lungs 2 (two) times daily. Rinse mouth after each use 1 Inhaler 5  . Magnesium Gluconate 500 (27 Mg) MG TABS  Take 1 tablet (500 mg total) by mouth 2 (two) times daily with a meal. 6 tablet 0  . montelukast (SINGULAIR) 10 MG tablet Take 1 tablet (10 mg total) by mouth at bedtime. 90 tablet 3  . rivaroxaban (XARELTO) 20 MG TABS tablet Take 1 tablet (20 mg total) by mouth daily with supper. 30 tablet 6  . flecainide (TAMBOCOR) 50 MG tablet Take 1 tablet (50 mg total) by mouth 2 (two) times daily. 60 tablet 1   No current facility-administered medications for this encounter.    Allergies  Allergen Reactions  . Lisinopril Cough    Social History   Socioeconomic History  . Marital status: Divorced    Spouse name: Not on file  . Number of  children: Not on file  . Years of education: Not on file  . Highest education level: Not on file  Occupational History  . Not on file  Tobacco Use  . Smoking status: Never Smoker  . Smokeless tobacco: Never Used  Substance and Sexual Activity  . Alcohol use: Not Currently    Alcohol/week: 6.0 standard drinks    Types: 5 Standard drinks or equivalent, 1 Glasses of wine per week    Comment: Drinks multiple times a week  . Drug use: No  . Sexual activity: Yes    Birth control/protection: None  Other Topics Concern  . Not on file  Social History Narrative  . Not on file   Social Determinants of Health   Financial Resource Strain:   . Difficulty of Paying Living Expenses: Not on file  Food Insecurity:   . Worried About Charity fundraiser in the Last Year: Not on file  . Ran Out of Food in the Last Year: Not on file  Transportation Needs:   . Lack of Transportation (Medical): Not on file  . Lack of Transportation (Non-Medical): Not on file  Physical Activity:   . Days of Exercise per Week: Not on file  . Minutes of Exercise per Session: Not on file  Stress:   . Feeling of Stress : Not on file  Social Connections:   . Frequency of Communication with Friends and Family: Not on file  . Frequency of Social Gatherings with Friends and Family: Not on file  . Attends Religious Services: Not on file  . Active Member of Clubs or Organizations: Not on file  . Attends Archivist Meetings: Not on file  . Marital Status: Not on file  Intimate Partner Violence:   . Fear of Current or Ex-Partner: Not on file  . Emotionally Abused: Not on file  . Physically Abused: Not on file  . Sexually Abused: Not on file     ROS- All systems are reviewed and negative except as per the HPI above.  Physical Exam: Vitals:   10/17/19 1041  BP: (!) 146/78  Pulse: 83  Weight: 106.8 kg  Height: 5\' 7"  (1.702 m)    GEN- The patient is well appearing obese female, alert and oriented x 3  today.   HEENT-head normocephalic, atraumatic, sclera clear, conjunctiva pink, hearing intact, trachea midline. Lungs- Clear to ausculation bilaterally, normal work of breathing Heart- irregular rate and rhythm, no murmurs, rubs or gallops  GI- soft, NT, ND, + BS Extremities- no clubbing, cyanosis, or edema MS- no significant deformity or atrophy Skin- no rash or lesion Psych- euthymic mood, full affect Neuro- strength and sensation are intact   Wt Readings from Last 3 Encounters:  10/17/19 106.8 kg  09/22/19 107.6 kg  08/23/19 108 kg    EKG today demonstrates afib HR 83, QRS 80, QTc 444  Echo 09/12/19  1. Left ventricular ejection fraction, by visual estimation, is 55 to 60%. The left ventricle has normal function. There is no left ventricular hypertrophy.  2. Left ventricular diastolic parameters are indeterminate.  3. Global right ventricle has normal systolic function.The right ventricular size is normal. No increase in right ventricular wall thickness.  4. Left atrial size was mildly dilated.  5. Right atrial size was normal.  6. The mitral valve is normal in structure. Moderate mitral valve regurgitation. No evidence of mitral stenosis.  7. The tricuspid valve is normal in structure. Tricuspid valve regurgitation moderate.  8. The aortic valve is normal in structure. Aortic valve regurgitation is not visualized. No evidence of aortic valve sclerosis or stenosis.  9. The pulmonic valve was normal in structure. Pulmonic valve regurgitation is mild. 10. Mildly elevated pulmonary artery systolic pressure. 11. The inferior vena cava is normal in size with greater than 50% respiratory variability, suggesting right atrial pressure of 3 mmHg.   Epic records are reviewed at length today  Assessment and Plan:  1. Persistent atrial fibrillation  Zio patch showed persistent afib with 100% burden.  After discussing therapeutic options, will start flecainide 50 mg BID. Patient will  return in one week for ECG. If she is still out of rhythm, will consider increasing flecainide vs DCCV. Continue diltiazem 180 mg daily Will continue Xarelto 20 mg daily. Patient denies any missed doses in the last 3 weeks.  This patients CHA2DS2-VASc Score and unadjusted Ischemic Stroke Rate (% per year) is equal to 7.2 % stroke rate/year from a score of 5  Above score calculated as 1 point each if present [CHF, HTN, DM, Vascular=MI/PAD/Aortic Plaque, Age if 65-74, or Female] Above score calculated as 2 points each if present [Age > 75, or Stroke/TIA/TE]   2. Obesity Body mass index is 36.88 kg/m. Lifestyle modification was discussed and encouraged including regular physical activity and weight reduction.  3. HTN Stable, no changes today.  4. NSVT 12 beat run seen on Zio patch. D/w Dr Rayann Heman. With normal EF and no syncope or concerning family history, no further workup at this time.    Follow up in the AF clinic in one week for ECG.   [facility] [address] [place], [place] [address] [phone] 10/17/2019 1:10 PM

## 2019-10-24 ENCOUNTER — Other Ambulatory Visit: Payer: Self-pay

## 2019-10-24 ENCOUNTER — Ambulatory Visit (HOSPITAL_COMMUNITY)
Admission: RE | Admit: 2019-10-24 | Discharge: 2019-10-24 | Disposition: A | Discharge status: Home / Self Care | Payer: BC Managed Care – PPO | Source: Ambulatory Visit | Attending: Physician Assistant | Admitting: Physician Assistant

## 2019-10-24 DIAGNOSIS — I4819 Other persistent atrial fibrillation: Secondary | ICD-10-CM

## 2019-10-24 DIAGNOSIS — Z79899 Other long term (current) drug therapy: Secondary | ICD-10-CM | POA: Diagnosis not present

## 2019-10-24 DIAGNOSIS — I4891 Unspecified atrial fibrillation: Secondary | ICD-10-CM | POA: Insufficient documentation

## 2019-10-24 MED ORDER — FLECAINIDE ACETATE 100 MG PO TABS: 100.0000 mg | ORAL_TABLET | Freq: Two times a day (BID) | ORAL | 3 refills | Status: DC

## 2019-10-24 MED ORDER — FLECAINIDE ACETATE 100 MG PO TABS: 100.0000 mg | ORAL_TABLET | Freq: Two times a day (BID) | ORAL | 0 refills | Status: DC

## 2019-10-24 NOTE — Patient Instructions (Signed)
Increase Flecainide 100mg twice daily  

## 2019-10-24 NOTE — Progress Notes (Signed)
Patient returns for ECG after starting flecainide. She remains in rate controlled afib with symptoms of fatigue. HR 82, QRS 78, QTc 448. We discussed increasing flecainide vs DCCV. Will increase flecainide to 100 mg BID. Patient wants to discuss with her family before scheduling DCCV. Return later this week for ECG only.

## 2019-10-24 NOTE — Addendum Note (Signed)
Encounter addended by: Juluis Mire, RN on: 10/24/2019 11:58 AM  Actions taken: Pharmacy for encounter modified, Order list changed

## 2019-10-25 ENCOUNTER — Encounter: Payer: Self-pay | Admitting: Nurse Practitioner

## 2019-10-25 ENCOUNTER — Encounter (HOSPITAL_COMMUNITY): Payer: Self-pay

## 2019-10-25 ENCOUNTER — Telehealth: Payer: Self-pay | Admitting: Nurse Practitioner

## 2019-10-25 ENCOUNTER — Ambulatory Visit (INDEPENDENT_AMBULATORY_CARE_PROVIDER_SITE_OTHER): Payer: BC Managed Care – PPO | Admitting: Nurse Practitioner

## 2019-10-25 ENCOUNTER — Other Ambulatory Visit (HOSPITAL_COMMUNITY): Payer: Self-pay | Admitting: *Deleted

## 2019-10-25 VITALS — BP 130/70 | HR 82 | Temp 96.4°F | Ht 67.0 in | Wt 236.4 lb

## 2019-10-25 DIAGNOSIS — J4521 Mild intermittent asthma with (acute) exacerbation: Secondary | ICD-10-CM | POA: Diagnosis not present

## 2019-10-25 DIAGNOSIS — Z1231 Encounter for screening mammogram for malignant neoplasm of breast: Secondary | ICD-10-CM | POA: Diagnosis not present

## 2019-10-25 DIAGNOSIS — E1169 Type 2 diabetes mellitus with other specified complication: Secondary | ICD-10-CM | POA: Diagnosis not present

## 2019-10-25 DIAGNOSIS — Z13220 Encounter for screening for lipoid disorders: Secondary | ICD-10-CM | POA: Diagnosis not present

## 2019-10-25 DIAGNOSIS — Z136 Encounter for screening for cardiovascular disorders: Secondary | ICD-10-CM

## 2019-10-25 DIAGNOSIS — I10 Essential (primary) hypertension: Secondary | ICD-10-CM

## 2019-10-25 DIAGNOSIS — Z0001 Encounter for general adult medical examination with abnormal findings: Secondary | ICD-10-CM | POA: Diagnosis not present

## 2019-10-25 DIAGNOSIS — J324 Chronic pansinusitis: Secondary | ICD-10-CM | POA: Diagnosis not present

## 2019-10-25 LAB — BASIC METABOLIC PANEL
BUN: 15 mg/dL (ref 6–23)
CO2: 27 mEq/L (ref 19–32)
Calcium: 9.3 mg/dL (ref 8.4–10.5)
Chloride: 106 mEq/L (ref 96–112)
Creatinine, Ser: 0.79 mg/dL (ref 0.40–1.20)
GFR: 88.91 mL/min (ref >60.00)
Glucose, Bld: 94 mg/dL (ref 70–99)
Potassium: 3.8 mEq/L (ref 3.5–5.1)
Sodium: 140 mEq/L (ref 135–145)

## 2019-10-25 LAB — LIPID PANEL
Cholesterol: 186 mg/dL (ref 0–200)
HDL: 62.6 mg/dL (ref >39.00)
LDL Cholesterol: 108 mg/dL — ABNORMAL HIGH (ref 0–99)
NonHDL: 123.36
Total CHOL/HDL Ratio: 3
Triglycerides: 77 mg/dL (ref 0.0–149.0)
VLDL: 15.4 mg/dL (ref 0.0–40.0)

## 2019-10-25 LAB — HEMOGLOBIN A1C: Hgb A1c MFr Bld: 6.4 % (ref 4.6–6.5)

## 2019-10-25 MED ORDER — AZELASTINE HCL 137 MCG/SPRAY NA SOLN: 1.0000 | Freq: Two times a day (BID) | NASAL | 1 refills | Status: DC

## 2019-10-25 MED ORDER — LEVALBUTEROL TARTRATE 45 MCG/ACT IN AERO: 1.0000 | INHALATION_SPRAY | Freq: Four times a day (QID) | RESPIRATORY_TRACT | 12 refills | Status: DC | PRN

## 2019-10-25 NOTE — Progress Notes (Signed)
Subjective:    Patient ID: Stacy Chapman, female    DOB: Aug 23, 1956, 64 y.o.   MRN: [id number]  Patient presents today for complete physical   HPI  Asthma: Stable with advair and montelukast Limits albuterol use due to increased palpitation.   DM: Controlled with Hgb A1c of 6.4 Diet controlled, No neuropathy. LDL at goal, normal urine microalbumin Will schedule annual eye exam.   HTN: BP at goal with cardizem BP Readings from Last 3 Encounters:  10/25/19 130/70  10/17/19 (!) 146/78  09/22/19 120/78   A-fib: Rate not controlled Under the care of cardiology. Use of xeralto for anticoagulation. Denies any abnormal bruising ot GI/GU bleed.  Sexual History (orientation,birth control, marital status, STD):single, not sexually active, PAP up to date (hx of positive HPV with normal PAP)  Depression/Suicide: Depression screen [facility] 2/9 10/25/2019 10/21/2018 06/15/2018 01/15/2017  Decreased Interest 0 0 0 0  Down, Depressed, Hopeless 0 0 0 0  PHQ - 2 Score 0 0 0 0   Vision:needed Dental:needed  Immunizations: (TDAP, Hep C screen, Pneumovax, Influenza, zoster)  Health Maintenance  Topic Date Due  . Mammogram  09/12/2006  . Eye exam for diabetics  10/05/2019  . Complete foot exam   10/22/2019  . Urine Protein Check  10/22/2019  . HIV Screening  10/24/2020*  . Hemoglobin A1C  04/23/2020  . Pap Smear  10/21/2021  . Colon Cancer Screening  12/01/2025  . Tetanus Vaccine  07/15/2028  . Flu Shot  Completed  . Pneumococcal vaccine  Completed  .  Hepatitis C: One time screening is recommended by Center for Disease Control  (CDC) for  adults born from [birth year] through 1965.   Completed  *Topic was postponed. The date shown is not the original due date.   Diet:low sodium  Weight:  Wt Readings from Last 3 Encounters:  10/25/19 236 lb 6.4 oz (107.2 kg)  10/17/19 235 lb 8 oz (106.8 kg)  09/22/19 237 lb 3.2 oz (107.6 kg)    Exercise:none  Fall Risk: Fall Risk  10/25/2019  10/21/2018 06/15/2018 01/15/2017  Falls in the past year? 0 0 No Yes  Number falls in past yr: - - - 1  Injury with Fall? - - - Yes  Comment - - - body pain   Advanced Directive: Advanced Directives 08/21/2019  Does Patient Have a Medical Advance Directive? No  Would patient like information on creating a medical advance directive? No - Patient declined     Medications and allergies reviewed with patient and updated if appropriate.  Patient Active Problem List   Diagnosis Date Noted  . Persistent atrial fibrillation ([place]) 10/17/2019  . Acquired thrombophilia ([place]) 08/23/2019  . Mild intermittent asthma with acute exacerbation 10/22/2018  . Paroxysmal atrial fibrillation ([place]) 07/19/2018  . Bronchitis 03/05/2017  . HTN (hypertension), benign 01/22/2017  . Elevated liver enzymes 01/22/2017  . Chronic pansinusitis 01/22/2017  . DM (diabetes mellitus) ([place]) 01/22/2017  . Abnormal head CT 01/22/2017    Current Outpatient Medications on File Prior to Visit  Medication Sig Dispense Refill  . diltiazem (CARDIZEM CD) 180 MG 24 hr capsule Take 1 capsule (180 mg total) by mouth daily. 30 capsule 3  . flecainide (TAMBOCOR) 100 MG tablet Take 1 tablet (100 mg total) by mouth 2 (two) times daily. 60 tablet 3  . fluticasone (FLONASE) 50 MCG/ACT nasal spray SHAKE LIQUID AND USE 2 SPRAYS IN EACH NOSTRIL DAILY 16 g 1  . fluticasone-salmeterol (ADVAIR HFA) 115-21 MCG/ACT inhaler Inhale  2 puffs into the lungs 2 (two) times daily. Rinse mouth after each use 1 Inhaler 5  . Magnesium Gluconate 500 (27 Mg) MG TABS Take 1 tablet (500 mg total) by mouth 2 (two) times daily with a meal. 6 tablet 0  . montelukast (SINGULAIR) 10 MG tablet Take 1 tablet (10 mg total) by mouth at bedtime. 90 tablet 3  . rivaroxaban (XARELTO) 20 MG TABS tablet Take 1 tablet (20 mg total) by mouth daily with supper. 30 tablet 6   No current facility-administered medications on file prior to visit.    Past Medical History:    Diagnosis Date  . Anemia   . Arthritis   . Asthma   . Stroke [facility]) 2018   TIA    Past Surgical History:  Procedure Laterality Date  . KNEE ARTHROSCOPY    . TONSILLECTOMY    . TUBAL LIGATION  1991    Social History   Socioeconomic History  . Marital status: Divorced    Spouse name: Not on file  . Number of children: Not on file  . Years of education: Not on file  . Highest education level: Not on file  Occupational History  . Not on file  Tobacco Use  . Smoking status: Never Smoker  . Smokeless tobacco: Never Used  Substance and Sexual Activity  . Alcohol use: Not Currently    Alcohol/week: 6.0 standard drinks    Types: 1 Glasses of wine, 5 Standard drinks or equivalent per week    Comment: Drinks multiple times a week  . Drug use: No  . Sexual activity: Yes    Birth control/protection: None  Other Topics Concern  . Not on file  Social History Narrative  . Not on file   Social Determinants of Health   Financial Resource Strain:   . Difficulty of Paying Living Expenses: Not on file  Food Insecurity:   . Worried About Charity fundraiser in the Last Year: Not on file  . Ran Out of Food in the Last Year: Not on file  Transportation Needs:   . Lack of Transportation (Medical): Not on file  . Lack of Transportation (Non-Medical): Not on file  Physical Activity:   . Days of Exercise per Week: Not on file  . Minutes of Exercise per Session: Not on file  Stress:   . Feeling of Stress : Not on file  Social Connections:   . Frequency of Communication with Friends and Family: Not on file  . Frequency of Social Gatherings with Friends and Family: Not on file  . Attends Religious Services: Not on file  . Active Member of Clubs or Organizations: Not on file  . Attends Archivist Meetings: Not on file  . Marital Status: Not on file    Family History  Problem Relation Age of Onset  . Stroke Father 50       death  . Hypertension Father   . Dementia  Sister   . Diabetes Sister   . Eczema Sister   . Hypertension Brother   . Stroke Brother 60       death  . Cancer Brother        lung  . Cancer Paternal Uncle        unknown  . Dementia Sister   . Colon cancer Neg Hx   . Colon polyps Neg Hx   . Stomach cancer Neg Hx   . Rectal cancer Neg Hx   . Esophageal cancer  Neg Hx         ROS  Objective:   Vitals:   10/25/19 0936  BP: 130/70  Pulse: 82  Temp: (!) 96.4 F (35.8 C)  SpO2: 99%    Body mass index is 37.03 kg/m.   Physical Examination:  Physical Exam Vitals reviewed.  Constitutional:      Appearance: She is obese.  HENT:     Right Ear: Tympanic membrane, ear canal and external ear normal.     Left Ear: External ear normal. There is impacted cerumen.  Eyes:     Extraocular Movements: Extraocular movements intact.     Conjunctiva/sclera: Conjunctivae normal.     Pupils: Pupils are equal, round, and reactive to light.  Cardiovascular:     Rate and Rhythm: Normal rate. Rhythm irregular.     Pulses: Normal pulses.     Heart sounds: Normal heart sounds.  Pulmonary:     Effort: Pulmonary effort is normal.     Breath sounds: Normal breath sounds.  Chest:     Breasts:        Right: Normal.        Left: Normal.  Abdominal:     General: Bowel sounds are normal.     Palpations: Abdomen is soft.  Musculoskeletal:     Cervical back: Normal range of motion and neck supple.     Right lower leg: No edema.     Left lower leg: No edema.  Lymphadenopathy:     Cervical: No cervical adenopathy.     Upper Body:     Right upper body: No supraclavicular, axillary or pectoral adenopathy.     Left upper body: No supraclavicular, axillary or pectoral adenopathy.  Skin:    General: Skin is warm and dry.  Neurological:     Mental Status: She is alert and oriented to person, place, and time.  Psychiatric:        Mood and Affect: Mood normal.        Behavior: Behavior normal.        Thought Content: Thought content  normal.    ASSESSMENT and PLAN: This visit occurred during the SARS-CoV-2 public health emergency.  Safety protocols were in place, including screening questions prior to the visit, additional usage of staff PPE, and extensive cleaning of exam room while observing appropriate contact time as indicated for disinfecting solutions.   Stacy Chapman was seen today for annual exam.  Diagnoses and all orders for this visit:  Encounter for preventative adult health care exam with abnormal findings -     HTN_4 Lipid panel -     MM DIGITAL SCREENING BILATERAL; Future  HTN (hypertension), benign -     HTN_1 BMP  Mild intermittent asthma with acute exacerbation -     levalbuterol (XOPENEX HFA) 45 MCG/ACT inhaler; Inhale 1 puff into the lungs every 6 (six) hours as needed for wheezing or shortness of breath.  Type 2 diabetes mellitus with other specified complication, without long-term current use of insulin (HCC) -     DM_1 Hemoglobin A1c -     HTN_1 BMP  Encounter for lipid screening for cardiovascular disease -     HTN_4 Lipid panel  Breast cancer screening by mammogram -     MM DIGITAL SCREENING BILATERAL; Future  Chronic pansinusitis -     Azelastine HCl 137 MCG/SPRAY SOLN; Place 1 spray into the nose 2 (two) times daily.   DM (diabetes mellitus) ([place]) Stable with HgbA1c of 6.4 Diet controlled,  No neuropathy. LDL at goal, normal urine microalbumin Will schedule annual eye exam. F/up in 66months  HTN (hypertension), benign BP Readings from Last 3 Encounters:  10/25/19 130/70  10/17/19 (!) 146/78  09/22/19 120/78  BP at goal with cardizem     Problem List Items Addressed This Visit      Cardiovascular and Mediastinum   HTN (hypertension), benign    BP Readings from Last 3 Encounters:  10/25/19 130/70  10/17/19 (!) 146/78  09/22/19 120/78  BP at goal with cardizem      Relevant Orders   HTN_1 BMP (Completed)     Respiratory   Chronic pansinusitis   Relevant Medications     Azelastine HCl 137 MCG/SPRAY SOLN   Mild intermittent asthma with acute exacerbation   Relevant Medications   levalbuterol (XOPENEX HFA) 45 MCG/ACT inhaler     Endocrine   DM (diabetes mellitus) ([place])    Stable with HgbA1c of 6.4 Diet controlled, No neuropathy. LDL at goal, normal urine microalbumin Will schedule annual eye exam. F/up in 4months      Relevant Orders   DM_1 Hemoglobin A1c (Completed)   HTN_1 BMP (Completed)    Other Visit Diagnoses    Encounter for preventative adult health care exam with abnormal findings    -  Primary   Relevant Orders   HTN_4 Lipid panel (Completed)   MM DIGITAL SCREENING BILATERAL   Encounter for lipid screening for cardiovascular disease       Relevant Orders   HTN_4 Lipid panel (Completed)   Breast cancer screening by mammogram       Relevant Orders   MM DIGITAL SCREENING BILATERAL       Follow up: Return in about 3 months (around 01/23/2020) for DM and HTN (video).  Wilfred Lacy, NP

## 2019-10-25 NOTE — Assessment & Plan Note (Signed)
>>  ASSESSMENT AND PLAN FOR ATRIAL FIBRILLATION WITH RVR (HCC) WRITTEN ON 10/16/2022  3:19 PM BY NCHE, CHARLOTTE LUM, NP  >>ASSESSMENT AND PLAN FOR HYPERTENSIVE DISORDER WRITTEN ON 10/25/2019  5:46 PM BY NCHE, CHARLOTTE LUM, NP  BP Readings from Last 3 Encounters:  10/25/19 130/70  10/17/19 (!) 146/78  09/22/19 120/78  BP at goal with cardizem

## 2019-10-25 NOTE — Patient Instructions (Addendum)
Stable HgbA1c at 6.4 Normal renal function Stable lipid panel with LDL at goal.  You will be contacted to schedule appt for mammogram Schedule annual eye exam and have report sent to me. Maintain DASH diet.  Use xopenex prn in place of albuterol due to its effect on A-Fib rate.   Health Maintenance, Female Adopting a healthy lifestyle and getting preventive care are important in promoting health and wellness. Ask your health care provider about:  The right schedule for you to have regular tests and exams.  Things you can do on your own to prevent diseases and keep yourself healthy. What should I know about diet, weight, and exercise? Eat a healthy diet   Eat a diet that includes plenty of vegetables, fruits, low-fat dairy products, and lean protein.  Do not eat a lot of foods that are high in solid fats, added sugars, or sodium. Maintain a healthy weight Body mass index (BMI) is used to identify weight problems. It estimates body fat based on height and weight. Your health care provider can help determine your BMI and help you achieve or maintain a healthy weight. Get regular exercise Get regular exercise. This is one of the most important things you can do for your health. Most adults should:  Exercise for at least 150 minutes each week. The exercise should increase your heart rate and make you sweat (moderate-intensity exercise).  Do strengthening exercises at least twice a week. This is in addition to the moderate-intensity exercise.  Spend less time sitting. Even light physical activity can be beneficial. Watch cholesterol and blood lipids Have your blood tested for lipids and cholesterol at 64 years of age, then have this test every 5 years. Have your cholesterol levels checked more often if:  Your lipid or cholesterol levels are high.  You are older than 64 years of age.  You are at high risk for heart disease. What should I know about cancer screening? Depending on  your health history and family history, you may need to have cancer screening at various ages. This may include screening for:  Breast cancer.  Cervical cancer.  Colorectal cancer.  Skin cancer.  Lung cancer. What should I know about heart disease, diabetes, and high blood pressure? Blood pressure and heart disease  High blood pressure causes heart disease and increases the risk of stroke. This is more likely to develop in people who have high blood pressure readings, are of African descent, or are overweight.  Have your blood pressure checked: ? Every 3-5 years if you are 49-93 years of age. ? Every year if you are 97 years old or older. Diabetes Have regular diabetes screenings. This checks your fasting blood sugar level. Have the screening done:  Once every three years after age 56 if you are at a normal weight and have a low risk for diabetes.  More often and at a younger age if you are overweight or have a high risk for diabetes. What should I know about preventing infection? Hepatitis B If you have a higher risk for hepatitis B, you should be screened for this virus. Talk with your health care provider to find out if you are at risk for hepatitis B infection. Hepatitis C Testing is recommended for:  Everyone born from [birth year] through 1965.  Anyone with known risk factors for hepatitis C. Sexually transmitted infections (STIs)  Get screened for STIs, including gonorrhea and chlamydia, if: ? You are sexually active and are younger than 64 years of  age. ? You are older than 64 years of age and your health care provider tells you that you are at risk for this type of infection. ? Your sexual activity has changed since you were last screened, and you are at increased risk for chlamydia or gonorrhea. Ask your health care provider if you are at risk.  Ask your health care provider about whether you are at high risk for HIV. Your health care provider may recommend a prescription  medicine to help prevent HIV infection. If you choose to take medicine to prevent HIV, you should first get tested for HIV. You should then be tested every 3 months for as long as you are taking the medicine. Pregnancy  If you are about to stop having your period (premenopausal) and you may become pregnant, seek counseling before you get pregnant.  Take 400 to 800 micrograms (mcg) of folic acid every day if you become pregnant.  Ask for birth control (contraception) if you want to prevent pregnancy. Osteoporosis and menopause Osteoporosis is a disease in which the bones lose minerals and strength with aging. This can result in bone fractures. If you are 90 years old or older, or if you are at risk for osteoporosis and fractures, ask your health care provider if you should:  Be screened for bone loss.  Take a calcium or vitamin D supplement to lower your risk of fractures.  Be given hormone replacement therapy (HRT) to treat symptoms of menopause. Follow these instructions at home: Lifestyle  Do not use any products that contain nicotine or tobacco, such as cigarettes, e-cigarettes, and chewing tobacco. If you need help quitting, ask your health care provider.  Do not use street drugs.  Do not share needles.  Ask your health care provider for help if you need support or information about quitting drugs. Alcohol use  Do not drink alcohol if: ? Your health care provider tells you not to drink. ? You are pregnant, may be pregnant, or are planning to become pregnant.  If you drink alcohol: ? Limit how much you use to 0-1 drink a day. ? Limit intake if you are breastfeeding.  Be aware of how much alcohol is in your drink. In the [place] one drink equals one 12 oz bottle of beer (355 mL), one 5 oz glass of wine (148 mL), or one 1 oz glass of hard liquor (44 mL). General instructions  Schedule regular health, dental, and eye exams.  Stay current with your vaccines.  Tell your health  care provider if: ? You often feel depressed. ? You have ever been abused or do not feel safe at home. Summary  Adopting a healthy lifestyle and getting preventive care are important in promoting health and wellness.  Follow your health care provider's instructions about healthy diet, exercising, and getting tested or screened for diseases.  Follow your health care provider's instructions on monitoring your cholesterol and blood pressure. This information is not intended to replace advice given to you by your health care provider. Make sure you discuss any questions you have with your health care provider. Document Revised: 09/22/2018 Document Reviewed: 09/22/2018 Elsevier Patient Education  2020 [employer].

## 2019-10-25 NOTE — Assessment & Plan Note (Signed)
>>  ASSESSMENT AND PLAN FOR HYPERTENSIVE DISORDER WRITTEN ON 10/25/2019  5:46 PM BY Leva Baine LUM, NP  BP Readings from Last 3 Encounters:  10/25/19 130/70  10/17/19 (!) 146/78  09/22/19 120/78  BP at goal with cardizem

## 2019-10-25 NOTE — Assessment & Plan Note (Signed)
BP Readings from Last 3 Encounters:  10/25/19 130/70  10/17/19 (!) 146/78  09/22/19 120/78  BP at goal with cardizem

## 2019-10-25 NOTE — Telephone Encounter (Signed)
PA for Xopenex started. Waiting for result.   [name] (Key: [place])

## 2019-10-25 NOTE — Assessment & Plan Note (Signed)
Stable with HgbA1c of 6.4 Diet controlled, No neuropathy. LDL at goal, normal urine microalbumin Will schedule annual eye exam. F/up in 99months

## 2019-10-26 NOTE — Telephone Encounter (Signed)
PA approved and pharmacy notify.   BIN  [id number] PCN  [facility] # [id number] ID  [id number]

## 2019-10-27 ENCOUNTER — Other Ambulatory Visit (HOSPITAL_COMMUNITY)
Admission: RE | Admit: 2019-10-27 | Discharge: 2019-10-27 | Disposition: A | Discharge status: Home / Self Care | Payer: BC Managed Care – PPO | Source: Ambulatory Visit | Attending: Internal Medicine | Admitting: Internal Medicine

## 2019-10-27 DIAGNOSIS — Z01812 Encounter for preprocedural laboratory examination: Secondary | ICD-10-CM | POA: Diagnosis not present

## 2019-10-27 DIAGNOSIS — Z20822 Contact with and (suspected) exposure to covid-19: Secondary | ICD-10-CM | POA: Diagnosis not present

## 2019-10-28 ENCOUNTER — Telehealth (HOSPITAL_COMMUNITY): Payer: Self-pay | Admitting: *Deleted

## 2019-10-28 ENCOUNTER — Encounter (HOSPITAL_COMMUNITY): Payer: BC Managed Care – PPO | Admitting: Physician Assistant

## 2019-10-28 LAB — NOVEL CORONAVIRUS, NAA (HOSP ORDER, SEND-OUT TO REF LAB; TAT 18-24 HRS): SARS-CoV-2, NAA: NOT DETECTED

## 2019-10-28 MED ORDER — FUROSEMIDE 20 MG PO TABS: ORAL_TABLET | ORAL | 0 refills | Status: DC

## 2019-10-28 MED ORDER — FLECAINIDE ACETATE 50 MG PO TABS: 75.0000 mg | ORAL_TABLET | Freq: Two times a day (BID) | ORAL | Status: DC

## 2019-10-28 NOTE — Telephone Encounter (Addendum)
Patient called in extremely anxious, short of breath and some chest pressure also endorses abdominal bloating. Denies lower extremity swelling or weight gain, dizziness or chest pain. HR on apple watch from 40-88 Does not have way to check her BP. Pt states she has been in the bed most of the day but wasn't able to rest due to shortness of breath. As we continued talking on the phone patient's breathing normalized and patient was able to calm down - feeling some better. Encouraged patient to try resting in chair or more pillows to support more upright sleeping position. Discussed with Roderic Palau NP will start on low dose lasix 20mg  daily for the next 3 days and decrease flecainide to 75mg  twice a day. ER precautions reviewed with patient. Will add istat on for morning of cardioversion.

## 2019-10-31 ENCOUNTER — Other Ambulatory Visit: Payer: Self-pay

## 2019-10-31 ENCOUNTER — Ambulatory Visit (HOSPITAL_COMMUNITY)
Admission: RE | Admit: 2019-10-31 | Discharge: 2019-10-31 | Disposition: A | Discharge status: Home / Self Care | Payer: BC Managed Care – PPO | Source: Ambulatory Visit | Attending: Physician Assistant | Admitting: Physician Assistant

## 2019-10-31 ENCOUNTER — Encounter (HOSPITAL_COMMUNITY): Payer: Self-pay | Admitting: Certified Registered Nurse Anesthetist

## 2019-10-31 ENCOUNTER — Encounter (HOSPITAL_COMMUNITY): Admission: RE | Payer: Self-pay | Source: Home / Self Care

## 2019-10-31 ENCOUNTER — Ambulatory Visit (HOSPITAL_COMMUNITY)
Admission: RE | Admit: 2019-10-31 | Payer: BC Managed Care – PPO | Source: Home / Self Care | Admitting: Internal Medicine

## 2019-10-31 DIAGNOSIS — I4819 Other persistent atrial fibrillation: Secondary | ICD-10-CM

## 2019-10-31 DIAGNOSIS — R9431 Abnormal electrocardiogram [ECG] [EKG]: Secondary | ICD-10-CM | POA: Diagnosis not present

## 2019-10-31 DIAGNOSIS — Z538 Procedure and treatment not carried out for other reasons: Secondary | ICD-10-CM | POA: Insufficient documentation

## 2019-10-31 SURGERY — CARDIOVERSION
Anesthesia: General

## 2019-10-31 MED ORDER — DILTIAZEM HCL ER COATED BEADS 120 MG PO CP24: 120.0000 mg | ORAL_CAPSULE | Freq: Every day | ORAL | 3 refills | Status: DC

## 2019-10-31 NOTE — Progress Notes (Signed)
Received call from [facility] that the patient went there this morning and she was in NSR, cancelling case.

## 2019-10-31 NOTE — Progress Notes (Signed)
Patient presents for ECG and labs prior to DCCV. ECG shows a junctional rhythm HR 57, PACs, QRS 76, QTc 439. Will cancel DCCV. Will decrease diltiazem to 120 mg daily. Pt to return in 3-4 days for repeat ECG.

## 2019-10-31 NOTE — Anesthesia Preprocedure Evaluation (Deleted)
Anesthesia Evaluation    Reviewed: Allergy & Precautions, Patient's Chart, lab work & pertinent test results  History of Anesthesia Complications Negative for: history of anesthetic complications  Airway        Dental   Pulmonary asthma ,           Cardiovascular hypertension, + dysrhythmias Atrial Fibrillation + Valvular Problems/Murmurs MR    '20 TTE - EF 55 to 60%. LA was mildly dilated. Moderate MR and TR. Mild PR. Mildly elevated pulmonary artery systolic pressure.    Neuro/Psych TIAnegative psych ROS   GI/Hepatic negative GI ROS, Neg liver ROS,   Endo/Other  diabetes, Type 2 Obesity   Renal/GU negative Renal ROS     Musculoskeletal  (+) Arthritis ,   Abdominal   Peds  Hematology negative hematology ROS (+)   Anesthesia Other Findings Covid neg 1/14   Reproductive/Obstetrics                             Anesthesia Physical Anesthesia Plan  ASA: III  Anesthesia Plan: General   Post-op Pain Management:    Induction: Intravenous  PONV Risk Score and Plan: 3 and Treatment may vary due to age or medical condition and Propofol infusion  Airway Management Planned: Mask and Natural Airway  Additional Equipment: None  Intra-op Plan:   Post-operative Plan:   Informed Consent:   Plan Discussed with: CRNA and Anesthesiologist  Anesthesia Plan Comments:         Anesthesia Quick Evaluation

## 2019-10-31 NOTE — Patient Instructions (Signed)
Decrease cardizem 120mg once a day   

## 2019-11-03 ENCOUNTER — Ambulatory Visit (HOSPITAL_COMMUNITY)
Admission: RE | Admit: 2019-11-03 | Discharge: 2019-11-03 | Disposition: A | Discharge status: Home / Self Care | Payer: BC Managed Care – PPO | Source: Ambulatory Visit | Attending: Physician Assistant | Admitting: Physician Assistant

## 2019-11-03 ENCOUNTER — Other Ambulatory Visit: Payer: Self-pay

## 2019-11-03 DIAGNOSIS — I4819 Other persistent atrial fibrillation: Secondary | ICD-10-CM | POA: Insufficient documentation

## 2019-11-03 DIAGNOSIS — E119 Type 2 diabetes mellitus without complications: Secondary | ICD-10-CM | POA: Diagnosis not present

## 2019-11-03 DIAGNOSIS — Z79899 Other long term (current) drug therapy: Secondary | ICD-10-CM | POA: Diagnosis not present

## 2019-11-03 DIAGNOSIS — R9431 Abnormal electrocardiogram [ECG] [EKG]: Secondary | ICD-10-CM | POA: Insufficient documentation

## 2019-11-03 DIAGNOSIS — Z823 Family history of stroke: Secondary | ICD-10-CM | POA: Insufficient documentation

## 2019-11-03 DIAGNOSIS — J45909 Unspecified asthma, uncomplicated: Secondary | ICD-10-CM | POA: Diagnosis not present

## 2019-11-03 DIAGNOSIS — M1990 Unspecified osteoarthritis, unspecified site: Secondary | ICD-10-CM | POA: Insufficient documentation

## 2019-11-03 DIAGNOSIS — Z7901 Long term (current) use of anticoagulants: Secondary | ICD-10-CM | POA: Diagnosis not present

## 2019-11-03 DIAGNOSIS — Z809 Family history of malignant neoplasm, unspecified: Secondary | ICD-10-CM | POA: Insufficient documentation

## 2019-11-03 DIAGNOSIS — E669 Obesity, unspecified: Secondary | ICD-10-CM | POA: Insufficient documentation

## 2019-11-03 DIAGNOSIS — Z833 Family history of diabetes mellitus: Secondary | ICD-10-CM | POA: Insufficient documentation

## 2019-11-03 DIAGNOSIS — I081 Rheumatic disorders of both mitral and tricuspid valves: Secondary | ICD-10-CM | POA: Insufficient documentation

## 2019-11-03 DIAGNOSIS — I10 Essential (primary) hypertension: Secondary | ICD-10-CM | POA: Insufficient documentation

## 2019-11-03 DIAGNOSIS — Z8673 Personal history of transient ischemic attack (TIA), and cerebral infarction without residual deficits: Secondary | ICD-10-CM | POA: Insufficient documentation

## 2019-11-03 DIAGNOSIS — D6869 Other thrombophilia: Secondary | ICD-10-CM

## 2019-11-03 DIAGNOSIS — Z801 Family history of malignant neoplasm of trachea, bronchus and lung: Secondary | ICD-10-CM | POA: Insufficient documentation

## 2019-11-03 DIAGNOSIS — Z888 Allergy status to other drugs, medicaments and biological substances status: Secondary | ICD-10-CM | POA: Diagnosis not present

## 2019-11-03 DIAGNOSIS — Z8249 Family history of ischemic heart disease and other diseases of the circulatory system: Secondary | ICD-10-CM | POA: Diagnosis not present

## 2019-11-03 DIAGNOSIS — I472 Ventricular tachycardia: Secondary | ICD-10-CM | POA: Diagnosis not present

## 2019-11-03 NOTE — Progress Notes (Signed)
Primary Care Physician: Lorayne Marek Charlene Brooke, NP Primary Cardiologist: none Primary Electrophysiologist: none Referring Physician: [facility]   Stacy Chapman is a 64 y.o. female with a history of HTN, DM, CVA, asthma, and paroxysmal atrial fibrillation who presents for consultation in the [facility].  The patient was initially diagnosed with atrial fibrillation remotely and had previously been on Xarelto per ER report. More recently, patient was seen at the ER 08/21/19 with symptoms of SOB and generalized weakness and was found by EMS to be in afib. She did not undergo DCCV as onset of afib was unclear and she was not on anticoagulation. She was discharged on Xarelto for a CHADS2VASC score of 5. Patient reports that she had two close family members pass away the day before her symptom onset. She does admit to alcohol use. She denies significant snoring. Patient wore a Zio patch which showed 100% afib burden which was rate controlled overall. She also had one episode of VT which was 12 beats. She denies any family history of sudden cardiac death and has no symptoms of syncope.   On follow up today, patient reports she is feeling more energetic since her last visit although she is still fatigued more than her baseline. She is back in afib today. She has not missed any doses of Xarelto.  Today, she denies symptoms of palpitations, chest pain, orthopnea, PND, lower extremity edema, dizziness, presyncope, syncope, snoring, daytime somnolence, bleeding, or neurologic sequela. The patient is tolerating medications without difficulties and is otherwise without complaint today.    Atrial Fibrillation Risk Factors:  she does not have symptoms or diagnosis of sleep apnea. she does not have a history of rheumatic fever. she does have a history of alcohol use. The patient does not have a history of early familial atrial fibrillation or other arrhythmias.  she has a  BMI of There is no height or weight on file to calculate BMI.. There were no vitals filed for this visit.  Family History  Problem Relation Age of Onset  . Stroke Father 8       death  . Hypertension Father   . Dementia Sister   . Diabetes Sister   . Eczema Sister   . Hypertension Brother   . Stroke Brother 13       death  . Cancer Brother        lung  . Cancer Paternal Uncle        unknown  . Dementia Sister   . Colon cancer Neg Hx   . Colon polyps Neg Hx   . Stomach cancer Neg Hx   . Rectal cancer Neg Hx   . Esophageal cancer Neg Hx      Atrial Fibrillation Management history:  Previous antiarrhythmic drugs: flecainide Previous cardioversions: none Previous ablations: none CHADS2VASC score: 5 Anticoagulation history: Xarelto   Past Medical History:  Diagnosis Date  . Anemia   . Arthritis   . Asthma   . Stroke [facility]) 2018   TIA   Past Surgical History:  Procedure Laterality Date  . KNEE ARTHROSCOPY    . TONSILLECTOMY    . TUBAL LIGATION  1991    Current Outpatient Medications  Medication Sig Dispense Refill  . acetaminophen (TYLENOL) 500 MG tablet Take 1,000 mg by mouth every 6 (six) hours as needed for moderate pain.    . Azelastine HCl 137 MCG/SPRAY SOLN Place 1 spray into the nose 2 (two) times daily. [place]  mL 1  . diltiazem (CARDIZEM CD) 120 MG 24 hr capsule Take 1 capsule (120 mg total) by mouth daily. 30 capsule 3  . flecainide (TAMBOCOR) 50 MG tablet Take 1.5 tablets (75 mg total) by mouth 2 (two) times daily.    . fluticasone (FLONASE) 50 MCG/ACT nasal spray SHAKE LIQUID AND USE 2 SPRAYS IN EACH NOSTRIL DAILY (Patient taking differently: Place 2 sprays into both nostrils daily. SHAKE LIQUID AND USE 2 SPRAYS IN EACH NOSTRIL DAILY) 16 g 1  . fluticasone-salmeterol (ADVAIR HFA) 115-21 MCG/ACT inhaler Inhale 2 puffs into the lungs 2 (two) times daily. Rinse mouth after each use 1 Inhaler 5  . furosemide (LASIX) 20 MG tablet Take 1 tablet daily for the next  3 days then only as needed for weight gain 10 tablet 0  . levalbuterol (XOPENEX HFA) 45 MCG/ACT inhaler Inhale 1 puff into the lungs every 6 (six) hours as needed for wheezing or shortness of breath. 1 Inhaler 12  . Magnesium Gluconate 500 (27 Mg) MG TABS Take 1 tablet (500 mg total) by mouth 2 (two) times daily with a meal. 6 tablet 0  . montelukast (SINGULAIR) 10 MG tablet Take 1 tablet (10 mg total) by mouth at bedtime. 90 tablet 3  . Multiple Vitamin (MULTIVITAMIN WITH MINERALS) TABS tablet Take 1 tablet by mouth daily.    . rivaroxaban (XARELTO) 20 MG TABS tablet Take 1 tablet (20 mg total) by mouth daily with supper. 30 tablet 6   No current facility-administered medications for this encounter.    Allergies  Allergen Reactions  . Lisinopril Cough    Social History   Socioeconomic History  . Marital status: Divorced    Spouse name: Not on file  . Number of children: Not on file  . Years of education: Not on file  . Highest education level: Not on file  Occupational History  . Not on file  Tobacco Use  . Smoking status: Never Smoker  . Smokeless tobacco: Never Used  Substance and Sexual Activity  . Alcohol use: Not Currently    Alcohol/week: 6.0 standard drinks    Types: 1 Glasses of wine, 5 Standard drinks or equivalent per week    Comment: Drinks multiple times a week  . Drug use: No  . Sexual activity: Yes    Birth control/protection: None  Other Topics Concern  . Not on file  Social History Narrative  . Not on file   Social Determinants of Health   Financial Resource Strain:   . Difficulty of Paying Living Expenses: Not on file  Food Insecurity:   . Worried About Charity fundraiser in the Last Year: Not on file  . Ran Out of Food in the Last Year: Not on file  Transportation Needs:   . Lack of Transportation (Medical): Not on file  . Lack of Transportation (Non-Medical): Not on file  Physical Activity:   . Days of Exercise per Week: Not on file  . Minutes  of Exercise per Session: Not on file  Stress:   . Feeling of Stress : Not on file  Social Connections:   . Frequency of Communication with Friends and Family: Not on file  . Frequency of Social Gatherings with Friends and Family: Not on file  . Attends Religious Services: Not on file  . Active Member of Clubs or Organizations: Not on file  . Attends Archivist Meetings: Not on file  . Marital Status: Not on file  Intimate Partner  Violence:   . Fear of Current or Ex-Partner: Not on file  . Emotionally Abused: Not on file  . Physically Abused: Not on file  . Sexually Abused: Not on file     ROS- All systems are reviewed and negative except as per the HPI above.  Physical Exam: There were no vitals filed for this visit.  GEN- The patient is well appearing obese female, alert and oriented x 3 today.   HEENT-head normocephalic, atraumatic, sclera clear, conjunctiva pink, hearing intact, trachea midline. Lungs- Clear to ausculation bilaterally, normal work of breathing Heart- irregular rate and rhythm, no murmurs, rubs or gallops  GI- soft, NT, ND, + BS Extremities- no clubbing, cyanosis, or edema MS- no significant deformity or atrophy Skin- no rash or lesion Psych- euthymic mood, full affect Neuro- strength and sensation are intact   Wt Readings from Last 3 Encounters:  10/25/19 107.2 kg  10/17/19 106.8 kg  09/22/19 107.6 kg    EKG today demonstrates afib HR 80, NST, QRS 84, QTc 445  Echo 09/12/19  1. Left ventricular ejection fraction, by visual estimation, is 55 to 60%. The left ventricle has normal function. There is no left ventricular hypertrophy.  2. Left ventricular diastolic parameters are indeterminate.  3. Global right ventricle has normal systolic function.The right ventricular size is normal. No increase in right ventricular wall thickness.  4. Left atrial size was mildly dilated.  5. Right atrial size was normal.  6. The mitral valve is normal in  structure. Moderate mitral valve regurgitation. No evidence of mitral stenosis.  7. The tricuspid valve is normal in structure. Tricuspid valve regurgitation moderate.  8. The aortic valve is normal in structure. Aortic valve regurgitation is not visualized. No evidence of aortic valve sclerosis or stenosis.  9. The pulmonic valve was normal in structure. Pulmonic valve regurgitation is mild. 10. Mildly elevated pulmonary artery systolic pressure. 11. The inferior vena cava is normal in size with greater than 50% respiratory variability, suggesting right atrial pressure of 3 mmHg.   Epic records are reviewed at length today  Assessment and Plan:  1. Persistent atrial fibrillation Patient back in afib today. She appears to be failing flecainide and had intolerable side effects at higher doses. She was in a junctional rhythm previously on higher doses of CCB. We discussed therapeutic options today including dofetilide and ablation. After discussing the risks and benefits, patient would like to be considered for ablation. Continue diltiazem 120 mg daily Will continue Xarelto 20 mg daily.   This patients CHA2DS2-VASc Score and unadjusted Ischemic Stroke Rate (% per year) is equal to 7.2 % stroke rate/year from a score of 5  Above score calculated as 1 point each if present [CHF, HTN, DM, Vascular=MI/PAD/Aortic Plaque, Age if 65-74, or Female] Above score calculated as 2 points each if present [Age > 75, or Stroke/TIA/TE]   2. Obesity There is no height or weight on file to calculate BMI. Lifestyle modification was discussed and encouraged including regular physical activity and weight reduction. Patient plans to increase her activity by walking and using her elliptical machine.  3. HTN No changes today.  4. NSVT 12 beat run seen on Zio patch.  With normal EF and no syncope or concerning family history, no further workup at this time.   Follow up with EP for ablation consideration.     [facility] [address] [place], [place] [address] [phone] 11/03/2019 9:10 AM

## 2019-11-04 ENCOUNTER — Telehealth: Payer: Self-pay

## 2019-11-04 ENCOUNTER — Other Ambulatory Visit (HOSPITAL_COMMUNITY): Payer: Self-pay | Admitting: Nurse Practitioner

## 2019-11-04 ENCOUNTER — Other Ambulatory Visit (HOSPITAL_COMMUNITY): Payer: Self-pay

## 2019-11-04 MED ORDER — FUROSEMIDE 20 MG PO TABS: 20.0000 mg | ORAL_TABLET | Freq: Every day | ORAL | 1 refills | Status: DC | PRN

## 2019-11-07 ENCOUNTER — Telehealth: Payer: Self-pay

## 2019-11-07 ENCOUNTER — Encounter: Payer: Self-pay | Admitting: Internal Medicine

## 2019-11-07 ENCOUNTER — Telehealth (INDEPENDENT_AMBULATORY_CARE_PROVIDER_SITE_OTHER): Payer: BC Managed Care – PPO | Admitting: Internal Medicine

## 2019-11-07 VITALS — BP 131/71 | HR 78 | Ht 67.0 in | Wt 235.0 lb

## 2019-11-07 DIAGNOSIS — D6869 Other thrombophilia: Secondary | ICD-10-CM | POA: Diagnosis not present

## 2019-11-07 DIAGNOSIS — I4819 Other persistent atrial fibrillation: Secondary | ICD-10-CM | POA: Diagnosis not present

## 2019-11-07 DIAGNOSIS — I10 Essential (primary) hypertension: Secondary | ICD-10-CM | POA: Diagnosis not present

## 2019-11-07 NOTE — Telephone Encounter (Signed)
-----   Message from Thompson Grayer, MD sent at 11/07/2019  2:59 PM EST ----- Afib ablation C/I/A  Cardiac CT

## 2019-11-07 NOTE — Progress Notes (Signed)
Electrophysiology TeleHealth Note   Due to national recommendations of social distancing due to [place] 19, Audio/video telehealth visit is felt to be most appropriate for this patient at this time.  See MyChart message from today for patient consent regarding telehealth for [facility].   Date:  11/07/2019   ID:  Stacy Chapman, DOB [birth year], MRN [id number]  Location: home Provider location: [place] Evaluation Performed: New patient consult  PCP:  Stacy Buffy, NP  Cardiologist:  No primary care provider on file.  Electrophysiologist:  None   Chief Complaint:  afib  History of Present Illness:    Stacy Chapman is a 64 y.o. female who presents via audio conferencing for a telehealth visit today.   The patient is referred for new consultation regarding afib by Stacy Chapman in the AF clinic. The patient has a h/o HTN, DM, obesity, and afib.  She reports initially being diagnosed with afib "years ago".  She did well, without recurrence until this past November when she returned to the ED with SOB and fatigue.   She has been treated with xarelto.  Recent Zio reveals 100% afib burden.  + palpitations  +edema She has been placed on flecainide with subsequent junctional rhythm and PACs.  She has since returned to afib.  She has had some dizziness with flecainide.  Today, she denies symptoms of chest pain, orthopnea, PND, dizziness, presyncope, syncope, bleeding, or neurologic sequela. The patient is tolerating medications without difficulties and is otherwise without complaint today.  she denies symptoms of cough, fevers, chills, or new SOB worrisome for COVID 19.   Past Medical History:  Diagnosis Date   Anemia    Arthritis    Asthma    Diabetes ([place])    Essential hypertension    Morbid obesity ([place])    Persistent atrial fibrillation ([place])    Stroke ([place]) 2018   TIA    Past Surgical History:  Procedure Laterality Date   KNEE ARTHROSCOPY      TONSILLECTOMY     TUBAL LIGATION  1991    Current Outpatient Medications  Medication Sig Dispense Refill   acetaminophen (TYLENOL) 500 MG tablet Take 1,000 mg by mouth every 6 (six) hours as needed for moderate pain.     Azelastine HCl 137 MCG/SPRAY SOLN Place 1 spray into the nose 2 (two) times daily. 30 mL 1   diltiazem (CARDIZEM CD) 120 MG 24 hr capsule Take 1 capsule (120 mg total) by mouth daily. 30 capsule 3   flecainide (TAMBOCOR) 50 MG tablet Take 1.5 tablets (75 mg total) by mouth 2 (two) times daily.     fluticasone (FLONASE) 50 MCG/ACT nasal spray SHAKE LIQUID AND USE 2 SPRAYS IN EACH NOSTRIL DAILY 16 g 1   fluticasone-salmeterol (ADVAIR HFA) 115-21 MCG/ACT inhaler Inhale 2 puffs into the lungs 2 (two) times daily. Rinse mouth after each use 1 Inhaler 5   furosemide (LASIX) 20 MG tablet Take 1 tablet (20 mg total) by mouth daily as needed for edema. 30 tablet 1   levalbuterol (XOPENEX HFA) 45 MCG/ACT inhaler Inhale 1 puff into the lungs every 6 (six) hours as needed for wheezing or shortness of breath. 1 Inhaler 12   Magnesium Gluconate 500 (27 Mg) MG TABS Take 1 tablet (500 mg total) by mouth 2 (two) times daily with a meal. 6 tablet 0   montelukast (SINGULAIR) 10 MG tablet Take 1 tablet (10 mg total) by mouth at bedtime. 90 tablet 3  rivaroxaban (XARELTO) 20 MG TABS tablet Take 1 tablet (20 mg total) by mouth daily with supper. 30 tablet 6   No current facility-administered medications for this visit.    Allergies:   Lisinopril   Social History:  The patient  reports that she has never smoked. She has never used smokeless tobacco. She reports previous alcohol use of about 6.0 standard drinks of alcohol per week. She reports that she does not use drugs.   Family History:  The patient's family history includes Cancer in her brother and paternal uncle; Dementia in her sister and sister; Diabetes in her sister; Eczema in her sister; Hypertension in her brother and  father; Stroke (age of onset: 13) in her father; Stroke (age of onset: 24) in her brother.    ROS:  Please see the history of present illness.   All other systems are personally reviewed and negative.    Exam:    Vital Signs:  BP 131/71    Pulse 78    Ht 5\' 7"  (1.702 m)    Wt 235 lb (106.6 kg)    BMI 36.81 kg/m    Well sounding, alert and conversant   Labs/Other Tests and Data Reviewed:    Recent Labs: 08/21/2019: ALT 25; Hemoglobin 12.7; Magnesium 1.5; Platelets 212; TSH 0.659 10/25/2019: BUN 15; Creatinine, Ser 0.79; Potassium 3.8; Sodium 140   Wt Readings from Last 3 Encounters:  11/07/19 235 lb (106.6 kg)  10/25/19 236 lb 6.4 oz (107.2 kg)  10/17/19 235 lb 8 oz (106.8 kg)     Other studies personally reviewed: Additional studies/ records that were reviewed today include: afib clinic notes, echo, recent monitor, ekgs  Review of the above records today demonstrates: as above   ASSESSMENT & PLAN:    1.  Persistent atrial fibrillation The patient has symptomatic, recurrent persistent atrial fibrillation. she has failed medical therapy with flecainide. Chads2vasc score is 5.  she is anticoagulated with xarelto. Therapeutic strategies for afib including medicine (tikosyn, amiodarone) and ablation were discussed in detail with the patient today. Risk, benefits, and alternatives to EP study and radiofrequency ablation for afib were also discussed in detail today. These risks include but are not limited to stroke, bleeding, vascular damage, tamponade, perforation, damage to the esophagus, lungs, and other structures, pulmonary vein stenosis, worsening renal function, and death. The patient understands these risk and wishes to proceed.  We will therefore proceed with catheter ablation at the next available time.  Carto, ICE, anesthesia are requested for the procedure.  Will also obtain cardiac CT prior to the procedure to exclude LAA thrombus and further evaluate atrial anatomy.  I have  reviewed the patients BMI and decreased success rates with ablation at length today.  Weight loss is strongly advised.  Per Guijian et al (PACE 2013; [id number]), patients with BMI 25-29.9 (obese) have a 27% increase in AF recurrence post ablation.  Patients with BMI >30 have a 31% increase in AF recurrence post ablation when compared to those with BMI <25.  2. Obesity Body mass index is 36.81 kg/m. Lifestyle modification was discussed in detail today  3. HTN Stable No change required today  4. Junctional rhythm Noted on flecainide Hopefully this will not advance to sick sinus syndrome requiring PPM We will assess at EPS  Current medicines are reviewed at length with the patient today.   The patient does not have concerns regarding her medicines.  The following changes were made today:  none  Labs/ tests ordered  today include:  No orders of the defined types were placed in this encounter.   Patient Risk:  after full review of this patients clinical status, I feel that they are at high risk at this time.   Today, I have spent 20 minutes with the patient with telehealth technology discussing afib .    Signed, Thompson Grayer MD, [facility] 11/07/2019 2:50 PM   [place] [address] [phone] (office) [phone] (fax)

## 2019-11-10 ENCOUNTER — Ambulatory Visit (HOSPITAL_COMMUNITY): Payer: BC Managed Care – PPO | Admitting: Physician Assistant

## 2019-11-11 ENCOUNTER — Telehealth: Payer: Self-pay

## 2019-11-11 NOTE — Telephone Encounter (Signed)
Left voicemail requesting call back reference referral to PREP program.  Will call back if no return call to reoffer.

## 2019-11-15 ENCOUNTER — Other Ambulatory Visit (HOSPITAL_COMMUNITY): Payer: Self-pay | Admitting: *Deleted

## 2019-11-15 MED ORDER — FLECAINIDE ACETATE 50 MG PO TABS: 75.0000 mg | ORAL_TABLET | Freq: Two times a day (BID) | ORAL | 3 refills | Status: DC

## 2019-11-23 ENCOUNTER — Other Ambulatory Visit: Payer: Self-pay | Admitting: Nurse Practitioner

## 2019-11-23 DIAGNOSIS — J309 Allergic rhinitis, unspecified: Secondary | ICD-10-CM

## 2019-11-23 DIAGNOSIS — J324 Chronic pansinusitis: Secondary | ICD-10-CM

## 2019-11-23 MED ORDER — FLUTICASONE PROPIONATE 50 MCG/ACT NA SUSP: NASAL | 1 refills | Status: DC

## 2019-11-30 ENCOUNTER — Other Ambulatory Visit: Payer: Self-pay

## 2019-11-30 DIAGNOSIS — J4521 Mild intermittent asthma with (acute) exacerbation: Secondary | ICD-10-CM

## 2019-11-30 MED ORDER — MONTELUKAST SODIUM 10 MG PO TABS: 10.0000 mg | ORAL_TABLET | Freq: Every day | ORAL | 3 refills | Status: DC

## 2019-12-08 ENCOUNTER — Ambulatory Visit
Admission: RE | Admit: 2019-12-08 | Discharge: 2019-12-08 | Disposition: A | Discharge status: Home / Self Care | Payer: BC Managed Care – PPO | Source: Ambulatory Visit | Attending: Nurse Practitioner | Admitting: Nurse Practitioner

## 2019-12-08 ENCOUNTER — Other Ambulatory Visit: Payer: Self-pay

## 2019-12-08 ENCOUNTER — Other Ambulatory Visit: Payer: Self-pay | Admitting: Nurse Practitioner

## 2019-12-08 DIAGNOSIS — Z1231 Encounter for screening mammogram for malignant neoplasm of breast: Secondary | ICD-10-CM

## 2019-12-08 DIAGNOSIS — Z0001 Encounter for general adult medical examination with abnormal findings: Secondary | ICD-10-CM

## 2020-01-06 LAB — HM DIABETES EYE EXAM

## 2020-01-08 ENCOUNTER — Other Ambulatory Visit (HOSPITAL_COMMUNITY): Payer: Self-pay | Admitting: Physician Assistant

## 2020-01-09 ENCOUNTER — Encounter: Payer: Self-pay | Admitting: Nurse Practitioner

## 2020-01-09 NOTE — Progress Notes (Signed)
Abstracted result and sent to scan  

## 2020-01-23 ENCOUNTER — Telehealth: Payer: Self-pay | Admitting: Nurse Practitioner

## 2020-01-23 ENCOUNTER — Ambulatory Visit: Payer: BC Managed Care – PPO | Admitting: Nurse Practitioner

## 2020-01-23 NOTE — Telephone Encounter (Signed)
pt called at 7:59:55AM on 01/23/20 to after hours nurse and wanted to cancel due to her being sick and having a fever, I called because appt was for a virtual and I wanted to see if she would want to be seen for that instead of follow up, she declined and said she would just call back to reschedule

## 2020-01-24 ENCOUNTER — Other Ambulatory Visit: Payer: Self-pay | Admitting: Nurse Practitioner

## 2020-01-24 DIAGNOSIS — J309 Allergic rhinitis, unspecified: Secondary | ICD-10-CM

## 2020-01-24 DIAGNOSIS — J324 Chronic pansinusitis: Secondary | ICD-10-CM

## 2020-02-13 NOTE — Telephone Encounter (Signed)
Spoke with Pt on phone and sent mychart message with no response.  Mailing letter requesting a call or mychart message regarding afib ablation.  Await further needs

## 2020-02-14 ENCOUNTER — Telehealth: Payer: Self-pay | Admitting: Nurse Practitioner

## 2020-02-14 NOTE — Telephone Encounter (Signed)
Pt was called back and scheduled for a follow up video appointment Friday 02/17/20 for DM and HTN.

## 2020-02-14 NOTE — Telephone Encounter (Signed)
Patient is returning the call. CB is [phone].

## 2020-02-17 ENCOUNTER — Telehealth (INDEPENDENT_AMBULATORY_CARE_PROVIDER_SITE_OTHER): Payer: BC Managed Care – PPO | Admitting: Nurse Practitioner

## 2020-02-17 ENCOUNTER — Encounter: Payer: Self-pay | Admitting: Nurse Practitioner

## 2020-02-17 ENCOUNTER — Other Ambulatory Visit: Payer: Self-pay

## 2020-02-17 VITALS — Ht 67.0 in | Wt 232.0 lb

## 2020-02-17 DIAGNOSIS — J4521 Mild intermittent asthma with (acute) exacerbation: Secondary | ICD-10-CM | POA: Diagnosis not present

## 2020-02-17 DIAGNOSIS — E1169 Type 2 diabetes mellitus with other specified complication: Secondary | ICD-10-CM

## 2020-02-17 MED ORDER — FLUTICASONE-SALMETEROL 115-21 MCG/ACT IN AERO: 1.0000 | INHALATION_SPRAY | Freq: Two times a day (BID) | RESPIRATORY_TRACT | 5 refills | Status: DC

## 2020-02-17 NOTE — Progress Notes (Signed)
Virtual Visit via Video Note  I connected with@ on 02/17/20 at  9:00 AM EDT by a video enabled telemedicine application and verified that I am speaking with the correct person using two identifiers.  Location: Patient:Home Provider: Office Participants: patient and provider  I discussed the limitations of evaluation and management by telemedicine and the availability of in person appointments. I also discussed with the patient that there may be a patient responsible charge related to this service. The patient expressed understanding and agreed to proceed.  CC:DM and HTN f/up, DM an HTN-Pt doesn't check blood sugars, HTN-pt states BP has been around 120-126's/78-82//pt said she needed refill on montelukast//no covid-pt will have BP at visit  History of Present Illness: Asthma: Controlled with ICS/LABA and montelukast No need for SABA prn  DM: Controlled with HgbA1c at 6.4 Negative neuropathy No glucose check at home BP and LDL at goal. Needs urine microalbumin collected Negative retinopathy and neuropathy.   Observations/Objective: Alert and oriented  Assessment and Plan: [place] was seen today for follow-up.  Diagnoses and all orders for this visit:  Type 2 diabetes mellitus with other specified complication, without long-term current use of insulin (HCC)  Mild intermittent asthma with acute exacerbation -     fluticasone-salmeterol (ADVAIR HFA) 115-21 MCG/ACT inhaler; Inhale 1 puff into the lungs 2 (two) times daily. Rinse mouth after each use   Follow Up Instructions: Go to lab for repeat HgbA1c and urine microalbumin Montelukast sent to local pharmacy with refills. Please contact pharmacy for medication refill. F/up 13months for CPE   I discussed the assessment and treatment plan with the patient. The patient was provided an opportunity to ask questions and all were answered. The patient agreed with the plan and demonstrated an understanding of the instructions.   The  patient was advised to call back or seek an in-person evaluation if the symptoms worsen or if the condition fails to improve as anticipated.  I provided 10 minutes of non-face-to-face time during this encounter.   Stacy Lacy, NP

## 2020-02-17 NOTE — Patient Instructions (Signed)
Go to lab for repeat HgbA1c and urine microalbumin Montelukast sent to local pharmacy with refills. Please contact pharmacy for medication refill. F/up 58months for CPE

## 2020-02-17 NOTE — Assessment & Plan Note (Signed)
Controlled with Advair 1puff BID and montelukast No need for xopenex

## 2020-02-22 ENCOUNTER — Other Ambulatory Visit: Payer: BC Managed Care – PPO

## 2020-02-27 ENCOUNTER — Other Ambulatory Visit (HOSPITAL_COMMUNITY): Payer: Self-pay | Admitting: Physician Assistant

## 2020-03-12 ENCOUNTER — Other Ambulatory Visit (HOSPITAL_COMMUNITY): Payer: Self-pay | Admitting: Physician Assistant

## 2020-12-01 ENCOUNTER — Other Ambulatory Visit: Payer: Self-pay | Admitting: Nurse Practitioner

## 2020-12-01 DIAGNOSIS — J4521 Mild intermittent asthma with (acute) exacerbation: Secondary | ICD-10-CM

## 2021-01-09 ENCOUNTER — Ambulatory Visit: Payer: BC Managed Care – PPO | Admitting: Nurse Practitioner

## 2021-01-10 ENCOUNTER — Encounter: Payer: Self-pay | Admitting: Nurse Practitioner

## 2021-01-10 ENCOUNTER — Ambulatory Visit: Payer: BC Managed Care – PPO | Admitting: Nurse Practitioner

## 2021-01-10 ENCOUNTER — Other Ambulatory Visit: Payer: Self-pay

## 2021-01-10 VITALS — BP 130/74 | HR 62 | Temp 97.3°F | Ht 67.5 in | Wt 229.0 lb

## 2021-01-10 DIAGNOSIS — I10 Essential (primary) hypertension: Secondary | ICD-10-CM

## 2021-01-10 DIAGNOSIS — E782 Mixed hyperlipidemia: Secondary | ICD-10-CM | POA: Diagnosis not present

## 2021-01-10 DIAGNOSIS — Z13220 Encounter for screening for lipoid disorders: Secondary | ICD-10-CM

## 2021-01-10 DIAGNOSIS — E1169 Type 2 diabetes mellitus with other specified complication: Secondary | ICD-10-CM

## 2021-01-10 DIAGNOSIS — J324 Chronic pansinusitis: Secondary | ICD-10-CM

## 2021-01-10 DIAGNOSIS — J4521 Mild intermittent asthma with (acute) exacerbation: Secondary | ICD-10-CM | POA: Diagnosis not present

## 2021-01-10 LAB — LIPID PANEL
Cholesterol: 249 mg/dL — ABNORMAL HIGH (ref 0–200)
HDL: 64.4 mg/dL (ref >39.00)
LDL Cholesterol: 161 mg/dL — ABNORMAL HIGH (ref 0–99)
NonHDL: 184.8
Total CHOL/HDL Ratio: 4
Triglycerides: 119 mg/dL (ref 0.0–149.0)
VLDL: 23.8 mg/dL (ref 0.0–40.0)

## 2021-01-10 LAB — CBC
HCT: 38.1 % (ref 36.0–46.0)
Hemoglobin: 12.6 g/dL (ref 12.0–15.0)
MCHC: 33 g/dL (ref 30.0–36.0)
MCV: 81.6 fl (ref 78.0–100.0)
Platelets: 381 10*3/uL (ref 150.0–400.0)
RBC: 4.67 Mil/uL (ref 3.87–5.11)
RDW: 15 % (ref 11.5–15.5)
WBC: 7.9 10*3/uL (ref 4.0–10.5)

## 2021-01-10 LAB — TSH: TSH: 1.97 u[IU]/mL (ref 0.35–4.50)

## 2021-01-10 LAB — COMPREHENSIVE METABOLIC PANEL
ALT: 33 U/L (ref 0–35)
AST: 20 U/L (ref 0–37)
Albumin: 4.5 g/dL (ref 3.5–5.2)
Alkaline Phosphatase: 93 U/L (ref 39–117)
BUN: 15 mg/dL (ref 6–23)
CO2: 28 mEq/L (ref 19–32)
Calcium: 9.7 mg/dL (ref 8.4–10.5)
Chloride: 103 mEq/L (ref 96–112)
Creatinine, Ser: 0.82 mg/dL (ref 0.40–1.20)
GFR: 75.64 mL/min (ref >60.00)
Glucose, Bld: 100 mg/dL — ABNORMAL HIGH (ref 70–99)
Potassium: 4.3 mEq/L (ref 3.5–5.1)
Sodium: 139 mEq/L (ref 135–145)
Total Bilirubin: 0.5 mg/dL (ref 0.2–1.2)
Total Protein: 7.3 g/dL (ref 6.0–8.3)

## 2021-01-10 LAB — MICROALBUMIN / CREATININE URINE RATIO
Creatinine,U: 111.3 mg/dL
Microalb Creat Ratio: 0.6 mg/g (ref 0.0–30.0)
Microalb, Ur: <0.7 mg/dL (ref 0.0–1.9)

## 2021-01-10 LAB — HEMOGLOBIN A1C: Hgb A1c MFr Bld: 5.8 % (ref 4.6–6.5)

## 2021-01-10 MED ORDER — ALBUTEROL SULFATE HFA 108 (90 BASE) MCG/ACT IN AERS
1.0000 | INHALATION_SPRAY | Freq: Four times a day (QID) | RESPIRATORY_TRACT | 0 refills | Status: DC | PRN
Start: 2021-01-10 — End: 2021-06-14

## 2021-01-10 MED ORDER — FLUTICASONE-SALMETEROL 115-21 MCG/ACT IN AERO: 1.0000 | INHALATION_SPRAY | Freq: Two times a day (BID) | RESPIRATORY_TRACT | 5 refills | Status: DC

## 2021-01-10 MED ORDER — MONTELUKAST SODIUM 10 MG PO TABS: 10.0000 mg | ORAL_TABLET | Freq: Every day | ORAL | 0 refills | Status: DC

## 2021-01-10 MED ORDER — AZELASTINE HCL 137 MCG/SPRAY NA SOLN
1.0000 | Freq: Two times a day (BID) | NASAL | 1 refills | Status: DC
Start: 2021-01-10 — End: 2021-01-14

## 2021-01-10 NOTE — Patient Instructions (Signed)
Schedule appt with (cardiology) Dr. Jackalyn Lombard office ASAP: [phone].  Go to lab for blood draw.

## 2021-01-10 NOTE — Assessment & Plan Note (Signed)
>>  ASSESSMENT AND PLAN FOR ATRIAL FIBRILLATION WITH RVR (HCC) WRITTEN ON 10/16/2022  3:19 PM BY NCHE, CHARLOTTE LUM, NP  >>ASSESSMENT AND PLAN FOR HYPERTENSIVE DISORDER WRITTEN ON 01/10/2021  3:18 PM BY NCHE, CHARLOTTE LUM, NP  BP at goal BP Readings from Last 3 Encounters:  01/10/21 130/74  11/07/19 131/71  10/25/19 130/70   repeat BMP Maintain furosemide  and cardizem

## 2021-01-10 NOTE — Assessment & Plan Note (Addendum)
Diet controlled Repeat hgbA1c, BMP, urine microalbumin and lipid panel: Stable lab results except abnormal lipid panel. With a 23.2% risk for cardiovascular disease, I recommend starting crestor. New rx sent. F/up in 57months

## 2021-01-10 NOTE — Progress Notes (Signed)
Subjective:  Patient ID: Stacy Chapman, female    DOB: Aug 12, 1956  Age: 65 y.o. MRN: [id number]  CC: Medication Refill (PT is here for med check )  HPI  HTN (hypertension), benign BP at goal BP Readings from Last 3 Encounters:  01/10/21 130/74  11/07/19 131/71  10/25/19 130/70   repeat BMP Maintain furosemide and cardizem  DM (diabetes mellitus) ([place]) Diet controlled Repeat hgbA1c, BMP, urine microalbumin and lipid panel: Stable lab results except abnormal lipid panel. With a 23.2% risk for cardiovascular disease, I recommend starting crestor. New rx sent. F/up in 18months   Mixed hyperlipidemia Stable lab results except abnormal lipid panel. With a 23.2% risk for cardiovascular disease, I recommend starting crestor. New rx sent. F/up in 83months  Lipid Panel     Component Value Date/Time   CHOL 249 (H) 01/10/2021 0836   TRIG 119.0 01/10/2021 0836   HDL 64.40 01/10/2021 0836   CHOLHDL 4 01/10/2021 0836   VLDL 23.8 01/10/2021 0836   LDLCALC 161 (H) 01/10/2021 0836     Reviewed past Medical, Social and Family history today.  Outpatient Medications Prior to Visit  Medication Sig Dispense Refill  . acetaminophen (TYLENOL) 500 MG tablet Take 1,000 mg by mouth every 6 (six) hours as needed for moderate pain.    Marland Kitchen diltiazem (CARDIZEM CD) 120 MG 24 hr capsule TAKE 1 CAPSULE(120 MG) BY MOUTH DAILY 30 capsule 11  . flecainide (TAMBOCOR) 50 MG tablet TAKE 1 AND 1/2 TABLETS(75 MG) BY MOUTH TWICE DAILY 90 tablet 6  . fluticasone (FLONASE) 50 MCG/ACT nasal spray SHAKE LIQUID AND USE 2 SPRAYS IN EACH NOSTRIL DAILY 16 g 1  . furosemide (LASIX) 20 MG tablet TAKE 1 TABLET BY MOUTH DAILY AS NEEDED FOR SWELLING/WEIGHT GAIN 30 tablet 6  . rivaroxaban (XARELTO) 20 MG TABS tablet Take 1 tablet (20 mg total) by mouth daily with supper. 30 tablet 6  . Azelastine HCl 137 MCG/SPRAY SOLN Place 1 spray into the nose 2 (two) times daily. 30 mL 1  . fluticasone-salmeterol (ADVAIR HFA)  115-21 MCG/ACT inhaler Inhale 1 puff into the lungs 2 (two) times daily. Rinse mouth after each use 1 Inhaler 5  . levalbuterol (XOPENEX HFA) 45 MCG/ACT inhaler Inhale 1 puff into the lungs every 6 (six) hours as needed for wheezing or shortness of breath. 1 Inhaler 12  . montelukast (SINGULAIR) 10 MG tablet Take 1 tablet (10 mg total) by mouth at bedtime. Need office visit for additional refills 90 tablet 0   No facility-administered medications prior to visit.   ROS See HPI  Objective:  BP 130/74 (BP Location: Left Arm, Patient Position: Sitting, Cuff Size: Normal)   Pulse 62   Temp (!) 97.3 F (36.3 C) (Temporal)   Ht 5' 7.5" (1.715 m)   Wt 229 lb (103.9 kg)   SpO2 98%   BMI 35.34 kg/m   Physical Exam Constitutional:      Appearance: She is obese.  Cardiovascular:     Rate and Rhythm: Normal rate and regular rhythm.     Pulses: Normal pulses.     Heart sounds: Normal heart sounds.  Pulmonary:     Effort: Pulmonary effort is normal.     Breath sounds: Normal breath sounds.  Musculoskeletal:     Right lower leg: No edema.     Left lower leg: No edema.  Neurological:     Mental Status: She is alert and oriented to person, place, and time.    Assessment &  Plan:  This visit occurred during the SARS-CoV-2 public health emergency.  Safety protocols were in place, including screening questions prior to the visit, additional usage of staff PPE, and extensive cleaning of exam room while observing appropriate contact time as indicated for disinfecting solutions.   Stacy Chapman was seen today for medication refill.  Diagnoses and all orders for this visit:  HTN (hypertension), benign -     CBC -     TSH -     rosuvastatin (CRESTOR) 10 MG tablet; Take 1 tablet (10 mg total) by mouth daily.  Type 2 diabetes mellitus with other specified complication, without long-term current use of insulin (HCC) -     Lipid panel -     Comprehensive metabolic panel -     Microalbumin / creatinine  urine ratio -     Hemoglobin A1c -     rosuvastatin (CRESTOR) 10 MG tablet; Take 1 tablet (10 mg total) by mouth daily.  Mixed hyperlipidemia -     Lipid panel -     rosuvastatin (CRESTOR) 10 MG tablet; Take 1 tablet (10 mg total) by mouth daily.  Mild intermittent asthma with acute exacerbation -     montelukast (SINGULAIR) 10 MG tablet; Take 1 tablet (10 mg total) by mouth at bedtime. Need office visit for additional refills -     fluticasone-salmeterol (ADVAIR HFA) 115-21 MCG/ACT inhaler; Inhale 1 puff into the lungs 2 (two) times daily. Rinse mouth after each use -     albuterol (VENTOLIN HFA) 108 (90 Base) MCG/ACT inhaler; Inhale 1 puff into the lungs every 6 (six) hours as needed.  Chronic pansinusitis -     Azelastine HCl 137 MCG/SPRAY SOLN; Place 1 spray into the nose 2 (two) times daily.  Schedule appt with (cardiology) Dr. Jackalyn Lombard office ASAP: [phone]. Stable lab results except abnormal lipid panel. With a 23.2% risk for cardiovascular disease, I recommend starting crestor. New rx sent. Schedule appt with cardiology asap and 32months f/up with me  Problem List Items Addressed This Visit      Cardiovascular and Mediastinum   HTN (hypertension), benign - Primary    BP at goal BP Readings from Last 3 Encounters:  01/10/21 130/74  11/07/19 131/71  10/25/19 130/70   repeat BMP Maintain furosemide and cardizem      Relevant Medications   rosuvastatin (CRESTOR) 10 MG tablet   Other Relevant Orders   CBC (Completed)   TSH (Completed)     Respiratory   Chronic pansinusitis   Relevant Medications   Azelastine HCl 137 MCG/SPRAY SOLN   Mild intermittent asthma with acute exacerbation   Relevant Medications   montelukast (SINGULAIR) 10 MG tablet   fluticasone-salmeterol (ADVAIR HFA) 115-21 MCG/ACT inhaler   albuterol (VENTOLIN HFA) 108 (90 Base) MCG/ACT inhaler     Endocrine   DM (diabetes mellitus) ([place])    Diet controlled Repeat hgbA1c, BMP, urine microalbumin  and lipid panel: Stable lab results except abnormal lipid panel. With a 23.2% risk for cardiovascular disease, I recommend starting crestor. New rx sent. F/up in 18months       Relevant Medications   rosuvastatin (CRESTOR) 10 MG tablet   Other Relevant Orders   Lipid panel (Completed)   Comprehensive metabolic panel (Completed)   Microalbumin / creatinine urine ratio (Completed)   Hemoglobin A1c (Completed)     Other   Mixed hyperlipidemia    Stable lab results except abnormal lipid panel. With a 23.2% risk for cardiovascular disease, I recommend  starting crestor. New rx sent. F/up in 57months  Lipid Panel     Component Value Date/Time   CHOL 249 (H) 01/10/2021 0836   TRIG 119.0 01/10/2021 0836   HDL 64.40 01/10/2021 0836   CHOLHDL 4 01/10/2021 0836   VLDL 23.8 01/10/2021 0836   LDLCALC 161 (H) 01/10/2021 0836         Relevant Medications   rosuvastatin (CRESTOR) 10 MG tablet   Other Relevant Orders   Lipid panel (Completed)      Follow-up: Return in about 6 months (around 07/12/2021) for CPE (fasting).  Wilfred Lacy, NP

## 2021-01-10 NOTE — Assessment & Plan Note (Signed)
>>  ASSESSMENT AND PLAN FOR HYPERTENSIVE DISORDER WRITTEN ON 01/10/2021  3:18 PM BY Dontreal Miera LUM, NP  BP at goal BP Readings from Last 3 Encounters:  01/10/21 130/74  11/07/19 131/71  10/25/19 130/70   repeat BMP Maintain furosemide and cardizem

## 2021-01-10 NOTE — Assessment & Plan Note (Signed)
BP at goal BP Readings from Last 3 Encounters:  01/10/21 130/74  11/07/19 131/71  10/25/19 130/70   repeat BMP Maintain furosemide and cardizem

## 2021-01-11 ENCOUNTER — Other Ambulatory Visit: Payer: Self-pay | Admitting: Nurse Practitioner

## 2021-01-11 DIAGNOSIS — E1169 Type 2 diabetes mellitus with other specified complication: Secondary | ICD-10-CM | POA: Insufficient documentation

## 2021-01-11 DIAGNOSIS — J324 Chronic pansinusitis: Secondary | ICD-10-CM

## 2021-01-11 DIAGNOSIS — E782 Mixed hyperlipidemia: Secondary | ICD-10-CM | POA: Insufficient documentation

## 2021-01-11 DIAGNOSIS — E785 Hyperlipidemia, unspecified: Secondary | ICD-10-CM | POA: Insufficient documentation

## 2021-01-11 MED ORDER — ROSUVASTATIN CALCIUM 10 MG PO TABS: 10.0000 mg | ORAL_TABLET | Freq: Every day | ORAL | 3 refills | Status: DC

## 2021-01-11 NOTE — Assessment & Plan Note (Signed)
Stable lab results except abnormal lipid panel. With a 23.2% risk for cardiovascular disease, I recommend starting crestor. New rx sent. F/up in 36months  Lipid Panel     Component Value Date/Time   CHOL 249 (H) 01/10/2021 0836   TRIG 119.0 01/10/2021 0836   HDL 64.40 01/10/2021 0836   CHOLHDL 4 01/10/2021 0836   VLDL 23.8 01/10/2021 0836   LDLCALC 161 (H) 01/10/2021 3419

## 2021-03-04 ENCOUNTER — Other Ambulatory Visit: Payer: Self-pay | Admitting: Nurse Practitioner

## 2021-03-04 DIAGNOSIS — J4521 Mild intermittent asthma with (acute) exacerbation: Secondary | ICD-10-CM

## 2021-03-12 ENCOUNTER — Other Ambulatory Visit: Payer: Self-pay | Admitting: Internal Medicine

## 2021-03-12 ENCOUNTER — Other Ambulatory Visit: Payer: Self-pay

## 2021-03-12 MED ORDER — FLECAINIDE ACETATE 50 MG PO TABS: ORAL_TABLET | ORAL | 0 refills | Status: DC

## 2021-03-12 MED ORDER — DILTIAZEM HCL ER COATED BEADS 120 MG PO CP24: ORAL_CAPSULE | ORAL | 0 refills | Status: DC

## 2021-03-12 NOTE — Telephone Encounter (Signed)
Pt's medication was sent to pt's pharmacy as requested. Confirmation received.  °

## 2021-04-02 ENCOUNTER — Telehealth: Payer: Self-pay | Admitting: Nurse Practitioner

## 2021-04-02 NOTE — Telephone Encounter (Signed)
(  Montelukast (Singular) 10 MG )   Patient will check her car to see if the Rx is there and call back. I will confirm with the pharmacy as to quantity given.   I confirmed the Rx , Per pharmacy it was 90 day supply in May.

## 2021-04-02 NOTE — Telephone Encounter (Signed)
What is the name of the medication? montelukast (SINGULAIR) 10 MG tablet [[id number]  Have you contacted your pharmacy to request a refill? Pt is saying she needs a refill on this script. I let her know it looks like she got a 90 day prescription on 03/04/21. She is saying she must have only gotten 30 pills, but she is now out. She did not take a pill last night. Please advise pt  Which pharmacy would you like this sent to? [facility]  [facility] [address]  [place], [place] [address]  Phone:  [phone]  Fax:  [phone]  DEA #:  [id number]    Patient notified that their request is being sent to the clinical staff for review and that they should receive a call once it is complete. If they do not receive a call within 72 hours they can check with their pharmacy or our office.

## 2021-04-03 NOTE — Telephone Encounter (Signed)
Per pharmacy Rep Rx was never picked up in May 2022. So it should be available for today. Message left on patient voicemail.

## 2021-04-12 ENCOUNTER — Other Ambulatory Visit: Payer: Self-pay | Admitting: Internal Medicine

## 2021-05-01 ENCOUNTER — Other Ambulatory Visit: Payer: Self-pay | Admitting: Internal Medicine

## 2021-05-06 ENCOUNTER — Other Ambulatory Visit: Payer: Self-pay | Admitting: Internal Medicine

## 2021-05-12 ENCOUNTER — Other Ambulatory Visit: Payer: Self-pay | Admitting: Internal Medicine

## 2021-05-13 MED ORDER — DILTIAZEM HCL ER COATED BEADS 120 MG PO CP24: ORAL_CAPSULE | ORAL | 0 refills | Status: DC

## 2021-05-13 NOTE — Telephone Encounter (Signed)
Left detailed message for Pt per DPR.  Advised she MUST make a follow up appointment for further refills.  Left direct # to AE to schedule appt.  Sent 15 additional tablets to pharmacy.

## 2021-05-28 ENCOUNTER — Other Ambulatory Visit: Payer: Self-pay | Admitting: Internal Medicine

## 2021-05-28 MED ORDER — FLECAINIDE ACETATE 50 MG PO TABS: 75.0000 mg | ORAL_TABLET | Freq: Two times a day (BID) | ORAL | 0 refills | Status: DC

## 2021-05-28 NOTE — Addendum Note (Signed)
Addended by: Gaetano Net on: 05/28/2021 07:32 AM   Modules accepted: Orders

## 2021-05-29 ENCOUNTER — Other Ambulatory Visit: Payer: Self-pay | Admitting: Internal Medicine

## 2021-06-10 ENCOUNTER — Other Ambulatory Visit: Payer: Self-pay | Admitting: Internal Medicine

## 2021-06-13 ENCOUNTER — Other Ambulatory Visit: Payer: Self-pay | Admitting: Nurse Practitioner

## 2021-06-13 DIAGNOSIS — J4521 Mild intermittent asthma with (acute) exacerbation: Secondary | ICD-10-CM

## 2021-06-14 NOTE — Telephone Encounter (Signed)
Chart supports rx refill Last ov: 01/10/2021 Last refill: 05/11/2021 .

## 2021-06-20 LAB — HM DIABETES EYE EXAM

## 2021-07-11 ENCOUNTER — Ambulatory Visit: Payer: BC Managed Care – PPO | Admitting: Nurse Practitioner

## 2021-07-11 ENCOUNTER — Other Ambulatory Visit: Payer: Self-pay | Admitting: Internal Medicine

## 2021-07-12 ENCOUNTER — Other Ambulatory Visit: Payer: Self-pay

## 2021-07-12 ENCOUNTER — Ambulatory Visit (INDEPENDENT_AMBULATORY_CARE_PROVIDER_SITE_OTHER): Payer: BC Managed Care – PPO | Admitting: Nurse Practitioner

## 2021-07-12 ENCOUNTER — Encounter: Payer: Self-pay | Admitting: Nurse Practitioner

## 2021-07-12 ENCOUNTER — Other Ambulatory Visit (HOSPITAL_COMMUNITY)
Admission: RE | Admit: 2021-07-12 | Discharge: 2021-07-12 | Disposition: A | Discharge status: Home / Self Care | Payer: BC Managed Care – PPO | Source: Ambulatory Visit | Attending: Nurse Practitioner | Admitting: Nurse Practitioner

## 2021-07-12 VITALS — BP 104/60 | HR 70 | Temp 96.9°F | Ht 66.0 in | Wt 230.4 lb

## 2021-07-12 DIAGNOSIS — E1169 Type 2 diabetes mellitus with other specified complication: Secondary | ICD-10-CM | POA: Diagnosis not present

## 2021-07-12 DIAGNOSIS — Z01419 Encounter for gynecological examination (general) (routine) without abnormal findings: Secondary | ICD-10-CM | POA: Insufficient documentation

## 2021-07-12 DIAGNOSIS — I480 Paroxysmal atrial fibrillation: Secondary | ICD-10-CM | POA: Diagnosis not present

## 2021-07-12 DIAGNOSIS — Z23 Encounter for immunization: Secondary | ICD-10-CM | POA: Diagnosis not present

## 2021-07-12 DIAGNOSIS — I10 Essential (primary) hypertension: Secondary | ICD-10-CM

## 2021-07-12 DIAGNOSIS — E782 Mixed hyperlipidemia: Secondary | ICD-10-CM | POA: Diagnosis not present

## 2021-07-12 NOTE — Assessment & Plan Note (Signed)
BP at goal with diltiazem BP Readings from Last 3 Encounters:  07/12/21 104/60  01/10/21 130/74  11/07/19 131/71

## 2021-07-12 NOTE — Assessment & Plan Note (Signed)
Repeat lipid panel Did not take crestor as prescribed.

## 2021-07-12 NOTE — Assessment & Plan Note (Signed)
Rate controlled with diltiazem and flecanide No SOB or syncope or palpitations Reports she has not take xarelto in over 1year Did not schedule aappt with cardiology as previously discussed. Advised about her risk of PE/DVT which can lead to CVA or MI or pulmonary infarct. She verbalized understanding and agreed to schedule appt with cardiology

## 2021-07-12 NOTE — Assessment & Plan Note (Signed)
>>  ASSESSMENT AND PLAN FOR ATRIAL FIBRILLATION WITH RVR (HCC) WRITTEN ON 10/16/2022  3:19 PM BY NCHE, CHARLOTTE LUM, NP  >>ASSESSMENT AND PLAN FOR ATRIAL FIBRILLATION WITH RVR (HCC) WRITTEN ON 10/16/2022  3:19 PM BY NCHE, CHARLOTTE LUM, NP  >>ASSESSMENT AND PLAN FOR HYPERTENSIVE DISORDER WRITTEN ON 07/12/2021 10:04 AM BY NCHE, CHARLOTTE LUM, NP  BP at goal with diltiazem  BP Readings from Last 3 Encounters:  07/12/21 104/60  01/10/21 130/74  11/07/19 131/71    >>ASSESSMENT AND PLAN FOR PAROXYSMAL ATRIAL FIBRILLATION (HCC) WRITTEN ON 07/12/2021 10:06 AM BY NCHE, CHARLOTTE LUM, NP  Rate controlled with diltiazem  and flecanide No SOB or syncope or palpitations Reports she has not take xarelto  in over 1year Did not schedule aappt with cardiology as previously discussed. Advised about her risk of PE/DVT which can lead to CVA or MI or pulmonary infarct. She verbalized understanding and agreed to schedule appt with cardiology

## 2021-07-12 NOTE — Assessment & Plan Note (Addendum)
>>  ASSESSMENT AND PLAN FOR ATRIAL FIBRILLATION WITH RVR ([place]) WRITTEN ON 10/16/2022  3:19 PM BY Shelitha Magley LUM, NP  >>ASSESSMENT AND PLAN FOR HYPERTENSIVE DISORDER WRITTEN ON 07/12/2021 10:04 AM BY Steph Cheadle LUM, NP  BP at goal with diltiazem BP Readings from Last 3 Encounters:  07/12/21 104/60  01/10/21 130/74  11/07/19 131/71    >>ASSESSMENT AND PLAN FOR PAROXYSMAL ATRIAL FIBRILLATION ([place]) WRITTEN ON 07/12/2021 10:06 AM BY Shalen Petrak LUM, NP  Rate controlled with diltiazem and flecanide No SOB or syncope or palpitations Reports she has not take xarelto in over 1year Did not schedule aappt with cardiology as previously discussed. Advised about her risk of PE/DVT which can lead to CVA or MI or pulmonary infarct. She verbalized understanding and agreed to schedule appt with cardiology

## 2021-07-12 NOTE — Patient Instructions (Signed)
Schedule appt with cardiology for A-Fib f/up  You will be contacted to schedule appt for mammogram.  Sign release form to get records from ophthalmology.  Schedule fasting lab appt. Need to be fasting 8hrs prior to blood draw. Ok to drink water.  Preventive Care 16-65 Years Old, Female Preventive care refers to lifestyle choices and visits with your health care provider that can promote health and wellness. This includes: A yearly physical exam. This is also called an annual wellness visit. Regular dental and eye exams. Immunizations. Screening for certain conditions. Healthy lifestyle choices, such as: Eating a healthy diet. Getting regular exercise. Not using drugs or products that contain nicotine and tobacco. Limiting alcohol use. What can I expect for my preventive care visit? Physical exam Your health care provider will check your: Height and weight. These may be used to calculate your BMI (body mass index). BMI is a measurement that tells if you are at a healthy weight. Heart rate and blood pressure. Body temperature. Skin for abnormal spots. Counseling Your health care provider may ask you questions about your: Past medical problems. Family's medical history. Alcohol, tobacco, and drug use. Emotional well-being. Home life and relationship well-being. Sexual activity. Diet, exercise, and sleep habits. Work and work Statistician. Access to firearms. Method of birth control. Menstrual cycle. Pregnancy history. What immunizations do I need? Vaccines are usually given at various ages, according to a schedule. Your health care provider will recommend vaccines for you based on your age, medical history, and lifestyle or other factors, such as travel or where you work. What tests do I need? Blood tests Lipid and cholesterol levels. These may be checked every 5 years, or more often if you are over 40 years old. Hepatitis C test. Hepatitis B test. Screening Lung cancer  screening. You may have this screening every year starting at age 48 if you have a 30-pack-year history of smoking and currently smoke or have quit within the past 15 years. Colorectal cancer screening. All adults should have this screening starting at age 81 and continuing until age 56. Your health care provider may recommend screening at age 90 if you are at increased risk. You will have tests every 1-10 years, depending on your results and the type of screening test. Diabetes screening. This is done by checking your blood sugar (glucose) after you have not eaten for a while (fasting). You may have this done every 1-3 years. Mammogram. This may be done every 1-2 years. Talk with your health care provider about when you should start having regular mammograms. This may depend on whether you have a family history of breast cancer. BRCA-related cancer screening. This may be done if you have a family history of breast, ovarian, tubal, or peritoneal cancers. Pelvic exam and Pap test. This may be done every 3 years starting at age 11. Starting at age 9, this may be done every 5 years if you have a Pap test in combination with an HPV test. Other tests STD (sexually transmitted disease) testing, if you are at risk. Bone density scan. This is done to screen for osteoporosis. You may have this scan if you are at high risk for osteoporosis. Talk with your health care provider about your test results, treatment options, and if necessary, the need for more tests. Follow these instructions at home: Eating and drinking  Eat a diet that includes fresh fruits and vegetables, whole grains, lean protein, and low-fat dairy products. Take vitamin and mineral supplements as recommended by  your health care provider. Do not drink alcohol if: Your health care provider tells you not to drink. You are pregnant, may be pregnant, or are planning to become pregnant. If you drink alcohol: Limit how much you have to  0-1 drink a day. Be aware of how much alcohol is in your drink. In the [place] one drink equals one 12 oz bottle of beer (355 mL), one 5 oz glass of wine (148 mL), or one 1 oz glass of hard liquor (44 mL). Lifestyle Take daily care of your teeth and gums. Brush your teeth every morning and night with fluoride toothpaste. Floss one time each day. Stay active. Exercise for at least 30 minutes 5 or more days each week. Do not use any products that contain nicotine or tobacco, such as cigarettes, e-cigarettes, and chewing tobacco. If you need help quitting, ask your health care provider. Do not use drugs. If you are sexually active, practice safe sex. Use a condom or other form of protection to prevent STIs (sexually transmitted infections). If you do not wish to become pregnant, use a form of birth control. If you plan to become pregnant, see your health care provider for a prepregnancy visit. If told by your health care provider, take low-dose aspirin daily starting at age 72. Find healthy ways to cope with stress, such as: Meditation, yoga, or listening to music. Journaling. Talking to a trusted person. Spending time with friends and family. Safety Always wear your seat belt while driving or riding in a vehicle. Do not drive: If you have been drinking alcohol. Do not ride with someone who has been drinking. When you are tired or distracted. While texting. Wear a helmet and other protective equipment during sports activities. If you have firearms in your house, make sure you follow all gun safety procedures. What's next? Visit your health care provider once a year for an annual wellness visit. Ask your health care provider how often you should have your eyes and teeth checked. Stay up to date on all vaccines. This information is not intended to replace advice given to you by your health care provider. Make sure you discuss any questions you have with your health care provider. Document  Revised: 12/07/2020 Document Reviewed: 06/10/2018 Elsevier Patient Education  2022 [employer].

## 2021-07-12 NOTE — Progress Notes (Signed)
Subjective:    Patient ID: Stacy Chapman, female    DOB: 02/21/1956, 65 y.o.   MRN: [id number]  Patient presents today for CPE and eval of chronic conditions  HPI HTN (hypertension), benign BP at goal with diltiazem BP Readings from Last 3 Encounters:  07/12/21 104/60  01/10/21 130/74  11/07/19 131/71    Paroxysmal atrial fibrillation (HCC) Rate controlled with diltiazem and flecanide No SOB or syncope or palpitations Reports she has not take xarelto in over 1year Did not schedule aappt with cardiology as previously discussed. Advised about her risk of PE/DVT which can lead to CVA or MI or pulmonary infarct. She verbalized understanding and agreed to schedule appt with cardiology  Mixed hyperlipidemia Repeat lipid panel Did not take crestor as prescribed.   Vision:up to date, report requested Dental:will schedule Diet:regular Exercise:walking Weight:  Wt Readings from Last 3 Encounters:  07/12/21 230 lb 6.4 oz (104.5 kg)  01/10/21 229 lb (103.9 kg)  02/17/20 232 lb (105.2 kg)    Sexual History (orientation,birth control, marital status, STD):denies need for STD screen, agreed to breast and pelvic exam today. Needs repeat mmmogram  Depression/Suicide: Depression screen [facility] 2/9 07/12/2021 10/25/2019 10/21/2018 06/15/2018 01/15/2017  Decreased Interest 0 0 0 0 0  Down, Depressed, Hopeless 0 0 0 0 0  PHQ - 2 Score 0 0 0 0 0  Altered sleeping 2 - - - -  Tired, decreased energy 1 - - - -  Change in appetite 0 - - - -  Feeling bad or failure about yourself  0 - - - -  Trouble concentrating 0 - - - -  Moving slowly or fidgety/restless 0 - - - -  Suicidal thoughts 0 - - - -  PHQ-9 Score 3 - - - -  Difficult doing work/chores Not difficult at all - - - -   Immunizations: (TDAP, Hep C screen, Pneumovax, Influenza, zoster)  Health Maintenance  Topic Date Due   COVID-19 Vaccine (1) Never done   Complete foot exam   10/22/2019   Hemoglobin A1C  07/12/2021   HIV  Screening  07/12/2022*   Zoster (Shingles) Vaccine (2 of 2) 09/06/2021   Pap Smear  10/21/2021   Mammogram  12/07/2021   Urine Protein Check  01/10/2022   Eye exam for diabetics  06/20/2022   Colon Cancer Screening  12/01/2025   Tetanus Vaccine  07/15/2028   Flu Shot  Completed   Hepatitis C Screening: USPSTF Recommendation to screen - Ages 18-79 yo.  Completed   HPV Vaccine  Aged Out  *Topic was postponed. The date shown is not the original due date.   Fall Risk: Fall Risk  01/10/2021 10/25/2019 10/21/2018 06/15/2018 01/15/2017  Falls in the past year? 0 0 0 No Yes  Number falls in past yr: 0 - - - 1  Injury with Fall? 0 - - - Yes  Comment - - - - body pain   Advanced Directive: Advanced Directives 08/21/2019  Does Patient Have a Medical Advance Directive? No  Would patient like information on creating a medical advance directive? No - Patient declined    Medications and allergies reviewed with patient and updated if appropriate.  Patient Active Problem List   Diagnosis Date Noted   Mixed hyperlipidemia 01/11/2021   Persistent atrial fibrillation ([place]) 10/17/2019   Secondary hypercoagulable state ([place]) 08/23/2019   Mild intermittent asthma with acute exacerbation 10/22/2018   Paroxysmal atrial fibrillation ([place]) 07/19/2018   Bronchitis 03/05/2017   HTN (  hypertension), benign 01/22/2017   Elevated liver enzymes 01/22/2017   Chronic pansinusitis 01/22/2017   DM (diabetes mellitus) ([place]) 01/22/2017   Abnormal head CT 01/22/2017   Current Outpatient Medications on File Prior to Visit  Medication Sig Dispense Refill   acetaminophen (TYLENOL) 500 MG tablet Take 1,000 mg by mouth every 6 (six) hours as needed for moderate pain.     albuterol (VENTOLIN HFA) 108 (90 Base) MCG/ACT inhaler INHALE 1 PUFF INTO THE LUNGS EVERY 6 HOURS AS NEEDED 6.7 g 0   azelastine (ASTELIN) 0.1 % nasal spray USE 1 SPRAY IN EACH NOSTRIL TWICE DAILY 90 mL 0   diltiazem (CARDIZEM CD) 120 MG 24 hr capsule TAKE 1  CAPSULE(120 MG) BY MOUTH DAILY. PLEASE MAKE OVERDUE APPOINTMENT WITH DOCTOR ALLRED BEFORE ANYMORE REFILLS 15 capsule 0   flecainide (TAMBOCOR) 50 MG tablet Take 1.5 tablets (75 mg total) by mouth 2 (two) times daily. 45 tablet 0   fluticasone (FLONASE) 50 MCG/ACT nasal spray SHAKE LIQUID AND USE 2 SPRAYS IN EACH NOSTRIL DAILY 16 g 1   fluticasone-salmeterol (ADVAIR HFA) 115-21 MCG/ACT inhaler Inhale 1 puff into the lungs 2 (two) times daily. Rinse mouth after each use 1 each 5   furosemide (LASIX) 20 MG tablet TAKE 1 TABLET BY MOUTH DAILY AS NEEDED FOR SWELLING/WEIGHT GAIN 30 tablet 6   montelukast (SINGULAIR) 10 MG tablet TAKE 1 TABLET(10 MG) BY MOUTH AT BEDTIME 90 tablet 0   rivaroxaban (XARELTO) 20 MG TABS tablet Take 1 tablet (20 mg total) by mouth daily with supper. (Patient not taking: Reported on 07/12/2021) 30 tablet 6   rosuvastatin (CRESTOR) 10 MG tablet Take 1 tablet (10 mg total) by mouth daily. (Patient not taking: Reported on 07/12/2021) 90 tablet 3   No current facility-administered medications on file prior to visit.   Past Medical History:  Diagnosis Date   Anemia    Arthritis    Asthma    Diabetes ([place])    Essential hypertension    Morbid obesity ([place])    Persistent atrial fibrillation ([place])    Stroke ([place]) 2018   TIA   Past Surgical History:  Procedure Laterality Date   KNEE ARTHROSCOPY     TONSILLECTOMY     TUBAL LIGATION  1991   Social History   Socioeconomic History   Marital status: Divorced    Spouse name: Not on file   Number of children: Not on file   Years of education: Not on file   Highest education level: Not on file  Occupational History   Not on file  Tobacco Use   Smoking status: Never   Smokeless tobacco: Never  Vaping Use   Vaping Use: Never used  Substance and Sexual Activity   Alcohol use: Not Currently    Alcohol/week: 6.0 standard drinks    Types: 1 Glasses of wine, 5 Standard drinks or equivalent per week    Comment: Drinks  multiple times a week   Drug use: No   Sexual activity: Yes    Birth control/protection: None  Other Topics Concern   Not on file  Social History Narrative   Lives in [place] and works in a Proof[employer]   Social Determinants of Radio broadcast assistant Strain: Not on file  Food Insecurity: Not on file  Transportation Needs: Not on file  Physical Activity: Not on file  Stress: Not on file  Social Connections: Not on file    Family History  Problem Relation Age of Onset  Stroke Father 46       death   Hypertension Father    Dementia Sister    Diabetes Sister    Eczema Sister    Hypertension Brother    Stroke Brother 39       death   Cancer Brother        lung   Cancer Paternal Uncle        unknown   Dementia Sister    Colon cancer Neg Hx    Colon polyps Neg Hx    Stomach cancer Neg Hx    Rectal cancer Neg Hx    Esophageal cancer Neg Hx        Review of Systems  Constitutional:  Negative for fever, malaise/fatigue and weight loss.  HENT:  Negative for congestion and sore throat.   Eyes:        Negative for visual changes  Respiratory:  Negative for cough and shortness of breath.   Cardiovascular:  Negative for chest pain, palpitations and leg swelling.  Gastrointestinal:  Negative for blood in stool, constipation, diarrhea and heartburn.  Genitourinary:  Negative for dysuria, frequency and urgency.  Musculoskeletal:  Negative for falls, joint pain and myalgias.  Skin:  Negative for rash.  Neurological:  Negative for dizziness, sensory change and headaches.  Endo/Heme/Allergies:  Does not bruise/bleed easily.  Psychiatric/Behavioral:  Negative for depression, substance abuse and suicidal ideas. The patient is not nervous/anxious.    Objective:   Vitals:   07/12/21 0843  BP: 104/60  Pulse: 70  Temp: (!) 96.9 F (36.1 C)  SpO2: 100%   Body mass index is 37.19 kg/m.  Physical Examination:  Physical Exam Vitals reviewed. Exam conducted with a  chaperone present.  Constitutional:      General: She is not in acute distress. HENT:     Right Ear: Tympanic membrane, ear canal and external ear normal.     Left Ear: Tympanic membrane, ear canal and external ear normal.  Eyes:     General: No scleral icterus.    Extraocular Movements: Extraocular movements intact.     Conjunctiva/sclera: Conjunctivae normal.  Cardiovascular:     Rate and Rhythm: Normal rate. Rhythm irregular.     Pulses: Normal pulses.     Heart sounds: Normal heart sounds.  Pulmonary:     Effort: Pulmonary effort is normal. No respiratory distress.     Breath sounds: Normal breath sounds.  Chest:  Breasts:    Breasts are symmetrical.     Right: Normal.     Left: Normal.  Abdominal:     General: Bowel sounds are normal. There is no distension.     Palpations: Abdomen is soft.     Hernia: There is no hernia in the left inguinal area or right inguinal area.  Genitourinary:    General: Normal vulva.     Labia:        Right: No rash, tenderness or lesion.        Left: No rash, tenderness or lesion.      Vagina: Normal.     Cervix: Normal.     Uterus: Normal.      Adnexa: Right adnexa normal and left adnexa normal.  Musculoskeletal:        General: Normal range of motion.     Cervical back: Normal range of motion and neck supple.  Lymphadenopathy:     Cervical: No cervical adenopathy.     Upper Body:     Right upper body:  No supraclavicular, axillary or pectoral adenopathy.     Left upper body: No supraclavicular, axillary or pectoral adenopathy.     Lower Body: No right inguinal adenopathy. No left inguinal adenopathy.  Skin:    General: Skin is warm and dry.  Neurological:     Mental Status: She is alert and oriented to person, place, and time.  Psychiatric:        Mood and Affect: Mood normal.        Behavior: Behavior normal.        Thought Content: Thought content normal.   ASSESSMENT and PLAN: This visit occurred during the SARS-CoV-2 public  health emergency.  Safety protocols were in place, including screening questions prior to the visit, additional usage of staff PPE, and extensive cleaning of exam room while observing appropriate contact time as indicated for disinfecting solutions.   Favor was seen today for annual exam.  Diagnoses and all orders for this visit:  Well woman exam with routine gynecological exam -     Comprehensive metabolic panel; Future -     CBC with Differential/Platelet; Future -     TSH; Future -     MM 3D SCREEN BREAST BILATERAL; Future -     Cytology - PAP  Flu vaccine need -     Cancel: Flu Vaccine QUAD 6+ mos PF IM (Fluarix Quad PF) -     Flu Vaccine QUAD High Dose(Fluad)  HTN (hypertension), benign  Type 2 diabetes mellitus with other specified complication, without long-term current use of insulin (HCC) -     Microalbumin / creatinine urine ratio; Future -     Hemoglobin A1c; Future  Mixed hyperlipidemia -     Lipid panel; Future  Need for shingles vaccine -     Varicella-zoster vaccine IM  Paroxysmal atrial fibrillation (HCC)     Problem List Items Addressed This Visit       Cardiovascular and Mediastinum   HTN (hypertension), benign    BP at goal with diltiazem BP Readings from Last 3 Encounters:  07/12/21 104/60  01/10/21 130/74  11/07/19 131/71        Paroxysmal atrial fibrillation (HCC)    Rate controlled with diltiazem and flecanide No SOB or syncope or palpitations Reports she has not take xarelto in over 1year Did not schedule aappt with cardiology as previously discussed. Advised about her risk of PE/DVT which can lead to CVA or MI or pulmonary infarct. She verbalized understanding and agreed to schedule appt with cardiology        Endocrine   DM (diabetes mellitus) ([place])   Relevant Orders   Microalbumin / creatinine urine ratio   Hemoglobin A1c     Other   Mixed hyperlipidemia    Repeat lipid panel Did not take crestor as prescribed.        Relevant Orders   Lipid panel   Other Visit Diagnoses     Well woman exam with routine gynecological exam    -  Primary   Relevant Orders   Comprehensive metabolic panel   CBC with Differential/Platelet   TSH   MM 3D SCREEN BREAST BILATERAL   Cytology - PAP   Flu vaccine need       Relevant Orders   Flu Vaccine QUAD High Dose(Fluad) (Completed)   Need for shingles vaccine       Relevant Orders   Varicella-zoster vaccine IM (Completed)       Follow up: Return in about 6 months (around  01/09/2022) for DM and HTN, hyperlipidemia (fasting).  Wilfred Lacy, NP

## 2021-07-15 LAB — CYTOLOGY - PAP
Comment: NEGATIVE
Diagnosis: NEGATIVE
High risk HPV: NEGATIVE

## 2021-07-18 ENCOUNTER — Other Ambulatory Visit (INDEPENDENT_AMBULATORY_CARE_PROVIDER_SITE_OTHER): Payer: BC Managed Care – PPO

## 2021-07-18 ENCOUNTER — Other Ambulatory Visit: Payer: Self-pay

## 2021-07-18 DIAGNOSIS — E782 Mixed hyperlipidemia: Secondary | ICD-10-CM | POA: Diagnosis not present

## 2021-07-18 DIAGNOSIS — Z01419 Encounter for gynecological examination (general) (routine) without abnormal findings: Secondary | ICD-10-CM | POA: Diagnosis not present

## 2021-07-18 DIAGNOSIS — E1169 Type 2 diabetes mellitus with other specified complication: Secondary | ICD-10-CM | POA: Diagnosis not present

## 2021-07-18 LAB — COMPREHENSIVE METABOLIC PANEL
ALT: 13 U/L (ref 0–35)
AST: 17 U/L (ref 0–37)
Albumin: 3.7 g/dL (ref 3.5–5.2)
Alkaline Phosphatase: 46 U/L (ref 39–117)
BUN: 27 mg/dL — ABNORMAL HIGH (ref 6–23)
CO2: 26 mEq/L (ref 19–32)
Calcium: 9.3 mg/dL (ref 8.4–10.5)
Chloride: 106 mEq/L (ref 96–112)
Creatinine, Ser: 1.06 mg/dL (ref 0.40–1.20)
GFR: 55.38 mL/min — ABNORMAL LOW (ref >60.00)
Glucose, Bld: 89 mg/dL (ref 70–99)
Potassium: 4.5 mEq/L (ref 3.5–5.1)
Sodium: 137 mEq/L (ref 135–145)
Total Bilirubin: 0.5 mg/dL (ref 0.2–1.2)
Total Protein: 7.6 g/dL (ref 6.0–8.3)

## 2021-07-18 LAB — CBC WITH DIFFERENTIAL/PLATELET
Basophils Absolute: 0 10*3/uL (ref 0.0–0.1)
Basophils Relative: 0.6 % (ref 0.0–3.0)
Eosinophils Absolute: 0.4 10*3/uL (ref 0.0–0.7)
Eosinophils Relative: 6.6 % — ABNORMAL HIGH (ref 0.0–5.0)
HCT: 35 % — ABNORMAL LOW (ref 36.0–46.0)
Hemoglobin: 11.2 g/dL — ABNORMAL LOW (ref 12.0–15.0)
Lymphocytes Relative: 44.1 % (ref 12.0–46.0)
Lymphs Abs: 2.5 10*3/uL (ref 0.7–4.0)
MCHC: 32 g/dL (ref 30.0–36.0)
MCV: 86 fl (ref 78.0–100.0)
Monocytes Absolute: 0.5 10*3/uL (ref 0.1–1.0)
Monocytes Relative: 9.4 % (ref 3.0–12.0)
Neutro Abs: 2.3 10*3/uL (ref 1.4–7.7)
Neutrophils Relative %: 39.3 % — ABNORMAL LOW (ref 43.0–77.0)
Platelets: 226 10*3/uL (ref 150.0–400.0)
RBC: 4.07 Mil/uL (ref 3.87–5.11)
RDW: 14.6 % (ref 11.5–15.5)
WBC: 5.7 10*3/uL (ref 4.0–10.5)

## 2021-07-18 LAB — MICROALBUMIN / CREATININE URINE RATIO
Creatinine,U: 43.4 mg/dL
Microalb Creat Ratio: 1.6 mg/g (ref 0.0–30.0)
Microalb, Ur: <0.7 mg/dL (ref 0.0–1.9)

## 2021-07-18 LAB — LIPID PANEL
Cholesterol: 188 mg/dL (ref 0–200)
HDL: 61.4 mg/dL (ref >39.00)
LDL Cholesterol: 111 mg/dL — ABNORMAL HIGH (ref 0–99)
NonHDL: 126.45
Total CHOL/HDL Ratio: 3
Triglycerides: 78 mg/dL (ref 0.0–149.0)
VLDL: 15.6 mg/dL (ref 0.0–40.0)

## 2021-07-18 LAB — TSH: TSH: 1.27 u[IU]/mL (ref 0.35–5.50)

## 2021-07-18 LAB — HEMOGLOBIN A1C: Hgb A1c MFr Bld: 6.3 % (ref 4.6–6.5)

## 2021-07-18 NOTE — Progress Notes (Signed)
Per the orders of NP Stacy Chapman pt is here for labs, pt otlerated draw well. Pt was able to leave adequate amount of urine for sample as requested.

## 2021-07-19 ENCOUNTER — Other Ambulatory Visit: Payer: Self-pay | Admitting: Internal Medicine

## 2021-07-24 ENCOUNTER — Other Ambulatory Visit: Payer: Self-pay | Admitting: Nurse Practitioner

## 2021-07-24 DIAGNOSIS — J4521 Mild intermittent asthma with (acute) exacerbation: Secondary | ICD-10-CM

## 2021-08-05 ENCOUNTER — Other Ambulatory Visit: Payer: Self-pay

## 2021-08-05 DIAGNOSIS — J4521 Mild intermittent asthma with (acute) exacerbation: Secondary | ICD-10-CM

## 2021-08-05 MED ORDER — ALBUTEROL SULFATE HFA 108 (90 BASE) MCG/ACT IN AERS: INHALATION_SPRAY | RESPIRATORY_TRACT | 2 refills | Status: DC

## 2021-08-05 NOTE — Telephone Encounter (Signed)
Refill fax request from [facility] for Albuterol due to asthma. Chart supports Rx.  Pt last ov 06/2021 Next ov 12/2021

## 2021-08-16 ENCOUNTER — Other Ambulatory Visit: Payer: Self-pay | Admitting: Internal Medicine

## 2021-08-26 ENCOUNTER — Other Ambulatory Visit: Payer: Self-pay

## 2021-08-26 ENCOUNTER — Encounter: Payer: Self-pay | Admitting: Internal Medicine

## 2021-08-26 ENCOUNTER — Ambulatory Visit: Payer: BC Managed Care – PPO | Admitting: Internal Medicine

## 2021-08-26 VITALS — BP 128/76 | HR 78 | Ht 66.0 in | Wt 233.0 lb

## 2021-08-26 DIAGNOSIS — I498 Other specified cardiac arrhythmias: Secondary | ICD-10-CM | POA: Diagnosis not present

## 2021-08-26 DIAGNOSIS — I4819 Other persistent atrial fibrillation: Secondary | ICD-10-CM | POA: Diagnosis not present

## 2021-08-26 DIAGNOSIS — I10 Essential (primary) hypertension: Secondary | ICD-10-CM | POA: Diagnosis not present

## 2021-08-26 MED ORDER — FUROSEMIDE 20 MG PO TABS: ORAL_TABLET | ORAL | 6 refills | Status: DC

## 2021-08-26 MED ORDER — WARFARIN SODIUM 5 MG PO TABS: 5.0000 mg | ORAL_TABLET | Freq: Every day | ORAL | 0 refills | Status: DC

## 2021-08-26 NOTE — Progress Notes (Signed)
PCP: [name], NP   Primary EP: Dr Verl Dicker Ashiyah Pavlak is a 65 y.o. female who presents today for routine electrophysiology followup.  Since last being seen in our clinic, the patient reports doing very well.  I saw her a year ago and discussed ablation.  She was to be schedule for the procedure but then changed her mind.  She states "I dont know when I have afib.  I dont feel anything". She has quit taking her xarelto due to costs.  Today, she denies symptoms of palpitations, chest pain, shortness of breath,  lower extremity edema, dizziness, presyncope, or syncope.  The patient is otherwise without complaint today.   Past Medical History:  Diagnosis Date   Anemia    Arthritis    Asthma    Diabetes ([place])    Essential hypertension    Morbid obesity ([place])    Persistent atrial fibrillation ([place])    Stroke ([place]) 2018   TIA   Past Surgical History:  Procedure Laterality Date   KNEE ARTHROSCOPY     TONSILLECTOMY     TUBAL LIGATION  1991    ROS- all systems are reviewed and negatives except as per HPI above  Current Outpatient Medications  Medication Sig Dispense Refill   albuterol (VENTOLIN HFA) 108 (90 Base) MCG/ACT inhaler INHALE 1 PUFF INTO THE LUNGS EVERY 6 HOURS AS NEEDED 6.7 g 2   azelastine (ASTELIN) 0.1 % nasal spray USE 1 SPRAY IN EACH NOSTRIL TWICE DAILY 90 mL 0   diltiazem (CARDIZEM CD) 120 MG 24 hr capsule TAKE ONE CAPSULE BY MOUTH DAILY. KEEP APPT IN 11/22 FOR MORE REFILLS 90 capsule 2   flecainide (TAMBOCOR) 50 MG tablet Take 1.5 tablets (75 mg total) by mouth 2 (two) times daily. 45 tablet 0   fluticasone (FLONASE) 50 MCG/ACT nasal spray SHAKE LIQUID AND USE 2 SPRAYS IN EACH NOSTRIL DAILY 16 g 1   furosemide (LASIX) 20 MG tablet TAKE 1 TABLET BY MOUTH DAILY AS NEEDED FOR SWELLING/WEIGHT GAIN 30 tablet 6   montelukast (SINGULAIR) 10 MG tablet TAKE 1 TABLET(10 MG) BY MOUTH AT BEDTIME 90 tablet 0   rosuvastatin (CRESTOR) 10 MG tablet Take 1 tablet  (10 mg total) by mouth daily. 90 tablet 3   acetaminophen (TYLENOL) 500 MG tablet Take 1,000 mg by mouth every 6 (six) hours as needed for moderate pain. (Patient not taking: Reported on 08/26/2021)     fluticasone-salmeterol (ADVAIR HFA) 115-21 MCG/ACT inhaler Inhale 1 puff into the lungs 2 (two) times daily. Rinse mouth after each use (Patient not taking: Reported on 08/26/2021) 1 each 5   rivaroxaban (XARELTO) 20 MG TABS tablet Take 1 tablet (20 mg total) by mouth daily with supper. (Patient not taking: Reported on 08/26/2021) 30 tablet 6   No current facility-administered medications for this visit.    Physical Exam: Vitals:   08/26/21 0852  BP: 128/76  Pulse: 78  SpO2: 97%  Weight: 233 lb (105.7 kg)  Height: 5\' 6"  (1.676 m)    GEN- The patient is overweight appearing, alert and oriented x 3 today.   Head- normocephalic, atraumatic Eyes-  Sclera clear, conjunctiva pink Ears- hearing intact Oropharynx- clear Lungs- Clear to ausculation bilaterally, normal work of breathing Heart- Regular rate and rhythm, no murmurs, rubs or gallops, PMI not laterally displaced GI- soft, NT, ND, + BS Extremities- no clubbing, cyanosis, or edema  Wt Readings from Last 3 Encounters:  08/26/21 233 lb (105.7 kg)  07/12/21  230 lb 6.4 oz (104.5 kg)  01/10/21 229 lb (103.9 kg)   Echo 09/12/2019- EF 55%  EKG tracing ordered today is personally reviewed and shows sinus rhythm  Assessment and Plan:  Persistent atrial fibrillation[ Chads2vasc score is at least 5 (will be 6 in December).  She was on xarelto but has not taken this recently due to cost concerns.  She is taking ASA. We discussed her very high stroke risks at length today.  I have offered NOAC, coumadin, or watchman as reasonable options.  She is now aware that ASA is not an acceptable alternative. She wishes to start coumadin. We will start coumadin through our pharmacy team today. She appears to be tolerating flecainide  Risks,  benefits and potential toxicities for medications prescribed and/or refilled reviewed with patient today.   2. HTN Stable No change required today  3. Obesity Body mass index is 37.61 kg/m. Lifestyle modification is again advised  Follow-up in AF clinic in 2 months  Thompson Grayer MD, [facility] 08/26/2021 9:07 AM

## 2021-08-26 NOTE — Patient Instructions (Addendum)
Medication Instructions:  Stop Aspirin  Start Coumadin 5 mg daily Your physician recommends that you continue on your current medications as directed. Please refer to the Current Medication list given to you today. *If you need a refill on your cardiac medications before your next appointment, please call your pharmacy*  Lab Work: None. If you have labs (blood work) drawn today and your tests are completely normal, you will receive your results only by: [place] (if you have MyChart) OR A paper copy in the mail If you have any lab test that is abnormal or we need to change your treatment, we will call you to review the results.  Testing/Procedures: None.  Follow-Up: At [facility], you and your health needs are our priority.  As part of our continuing mission to provide you with exceptional heart care, we have created designated Provider Care Teams.  These Care Teams include your primary Cardiologist (physician) and Advanced Practice Providers (APPs -  Physician Assistants and Nurse Practitioners) who all work together to provide you with the care you need, when you need it.  Your physician wants you to follow-up in: 09/02/21 at 10:30 am with the Coumadin Clinic. 2 months with the Afib Clinic. They will contact you to schedule.   We recommend signing up for the patient portal called "MyChart".  Sign up information is provided on this After Visit Summary.  MyChart is used to connect with patients for Virtual Visits (Telemedicine).  Patients are able to view lab/test results, encounter notes, upcoming appointments, etc.  Non-urgent messages can be sent to your provider as well.   To learn more about what you can do with MyChart, go to [email or web address].    Any Other Special Instructions Will Be Listed Below (If Applicable).

## 2021-08-29 ENCOUNTER — Ambulatory Visit: Payer: BC Managed Care – PPO

## 2021-09-02 ENCOUNTER — Other Ambulatory Visit: Payer: Self-pay

## 2021-09-02 ENCOUNTER — Ambulatory Visit (INDEPENDENT_AMBULATORY_CARE_PROVIDER_SITE_OTHER): Payer: BC Managed Care – PPO

## 2021-09-02 DIAGNOSIS — Z5181 Encounter for therapeutic drug level monitoring: Secondary | ICD-10-CM

## 2021-09-02 DIAGNOSIS — I480 Paroxysmal atrial fibrillation: Secondary | ICD-10-CM

## 2021-09-02 LAB — POCT INR: INR: 1.2 — AB (ref 2.0–3.0)

## 2021-09-02 NOTE — Patient Instructions (Signed)
-   take 2 tablets warfarin tonight and tomorrow, then - On Wednesday, START taking 1 tablet every day EXCEPT 2 TABLETS ON MONDAYS, [place]. - Recheck INR in 1 week

## 2021-09-04 ENCOUNTER — Ambulatory Visit: Payer: BC Managed Care – PPO

## 2021-09-10 ENCOUNTER — Other Ambulatory Visit: Payer: Self-pay

## 2021-09-10 ENCOUNTER — Ambulatory Visit
Admission: RE | Admit: 2021-09-10 | Discharge: 2021-09-10 | Disposition: A | Discharge status: Home / Self Care | Payer: BC Managed Care – PPO | Source: Ambulatory Visit | Attending: Nurse Practitioner | Admitting: Nurse Practitioner

## 2021-09-10 ENCOUNTER — Ambulatory Visit (INDEPENDENT_AMBULATORY_CARE_PROVIDER_SITE_OTHER): Payer: BC Managed Care – PPO | Admitting: *Deleted

## 2021-09-10 DIAGNOSIS — Z01419 Encounter for gynecological examination (general) (routine) without abnormal findings: Secondary | ICD-10-CM

## 2021-09-10 DIAGNOSIS — I4891 Unspecified atrial fibrillation: Secondary | ICD-10-CM

## 2021-09-10 DIAGNOSIS — Z5181 Encounter for therapeutic drug level monitoring: Secondary | ICD-10-CM | POA: Insufficient documentation

## 2021-09-10 DIAGNOSIS — I480 Paroxysmal atrial fibrillation: Secondary | ICD-10-CM | POA: Diagnosis not present

## 2021-09-10 LAB — POCT INR: INR: 1.3 — AB (ref 2.0–3.0)

## 2021-09-10 MED ORDER — WARFARIN SODIUM 5 MG PO TABS: ORAL_TABLET | ORAL | 1 refills | Status: DC

## 2021-09-10 NOTE — Patient Instructions (Signed)
Description   Today take 2.5 tablets today then start taking 2 tablets daily. Recheck INR in 1 week. Coumadin Clinic [phone]

## 2021-09-16 ENCOUNTER — Ambulatory Visit (INDEPENDENT_AMBULATORY_CARE_PROVIDER_SITE_OTHER): Payer: BC Managed Care – PPO | Admitting: *Deleted

## 2021-09-16 ENCOUNTER — Other Ambulatory Visit: Payer: Self-pay

## 2021-09-16 DIAGNOSIS — I480 Paroxysmal atrial fibrillation: Secondary | ICD-10-CM

## 2021-09-16 DIAGNOSIS — Z5181 Encounter for therapeutic drug level monitoring: Secondary | ICD-10-CM | POA: Diagnosis not present

## 2021-09-16 LAB — POCT INR: INR: 1.1 — AB (ref 2.0–3.0)

## 2021-09-16 NOTE — Patient Instructions (Signed)
Description   Start taking warfarin 2 tablets daily except for 3 tablets on Mondays and Fridays. Recheck INR in 1 week. Coumadin Clinic [phone].

## 2021-09-23 ENCOUNTER — Ambulatory Visit (INDEPENDENT_AMBULATORY_CARE_PROVIDER_SITE_OTHER): Payer: BC Managed Care – PPO | Admitting: *Deleted

## 2021-09-23 ENCOUNTER — Other Ambulatory Visit: Payer: Self-pay

## 2021-09-23 DIAGNOSIS — Z5181 Encounter for therapeutic drug level monitoring: Secondary | ICD-10-CM

## 2021-09-23 DIAGNOSIS — I480 Paroxysmal atrial fibrillation: Secondary | ICD-10-CM

## 2021-09-23 LAB — POCT INR: INR: 1.7 — AB (ref 2.0–3.0)

## 2021-09-23 NOTE — Patient Instructions (Signed)
Description   Take 3.5 tablets of warfarin today.  Then START taking warfarin 2 tablets daily except for 3 tablets on Mondays, Wednesdays and Fridays. Be consistent with your greens. Recheck INR in 1 week. Coumadin Clinic [phone].

## 2021-09-30 ENCOUNTER — Other Ambulatory Visit: Payer: Self-pay

## 2021-09-30 ENCOUNTER — Ambulatory Visit (INDEPENDENT_AMBULATORY_CARE_PROVIDER_SITE_OTHER): Payer: BC Managed Care – PPO

## 2021-09-30 ENCOUNTER — Other Ambulatory Visit: Payer: Self-pay | Admitting: Nurse Practitioner

## 2021-09-30 DIAGNOSIS — Z5181 Encounter for therapeutic drug level monitoring: Secondary | ICD-10-CM | POA: Diagnosis not present

## 2021-09-30 DIAGNOSIS — I4891 Unspecified atrial fibrillation: Secondary | ICD-10-CM | POA: Diagnosis not present

## 2021-09-30 DIAGNOSIS — I480 Paroxysmal atrial fibrillation: Secondary | ICD-10-CM | POA: Diagnosis not present

## 2021-09-30 DIAGNOSIS — J4521 Mild intermittent asthma with (acute) exacerbation: Secondary | ICD-10-CM

## 2021-09-30 LAB — POCT INR: INR: 3.6 — AB (ref 2.0–3.0)

## 2021-09-30 NOTE — Patient Instructions (Signed)
Description   Skip tomorrow's dosage of Warfarin, then start taking warfarin 2 tablets daily except for 3 tablets on Mondays and Fridays. Be consistent with your greens. Recheck INR in 1 week. Coumadin Clinic [phone].

## 2021-10-02 ENCOUNTER — Telehealth: Payer: Self-pay | Admitting: *Deleted

## 2021-10-02 NOTE — Telephone Encounter (Signed)
Returned a call to the pt regarding here MyChart message she sent to EP about her green intake. Advised pt to stay consistent with her leafy smoothies and her intake of veggies if she plans to partake in them. Educated her again and she was grateful and stated it relieved her worries. She wanted to know if she needed to trade off a leafy smoothie for collards over the holiday and advised she could so she keeps 3-4 in her diet. She was thankful for the assistance.

## 2021-10-10 ENCOUNTER — Other Ambulatory Visit: Payer: Self-pay

## 2021-10-10 ENCOUNTER — Ambulatory Visit (INDEPENDENT_AMBULATORY_CARE_PROVIDER_SITE_OTHER): Payer: BC Managed Care – PPO | Admitting: *Deleted

## 2021-10-10 DIAGNOSIS — I480 Paroxysmal atrial fibrillation: Secondary | ICD-10-CM | POA: Diagnosis not present

## 2021-10-10 DIAGNOSIS — Z5181 Encounter for therapeutic drug level monitoring: Secondary | ICD-10-CM

## 2021-10-10 DIAGNOSIS — I4891 Unspecified atrial fibrillation: Secondary | ICD-10-CM

## 2021-10-10 LAB — POCT INR: INR: 1.4 — AB (ref 2.0–3.0)

## 2021-10-10 NOTE — Patient Instructions (Signed)
Description   Today take 2.5 tablets and tomorrow take 3.5 tablets then continue taking warfarin 2 tablets daily except for 3 tablets on Mondays and Fridays. Be consistent with your greens. Recheck INR in 1 week. Coumadin Clinic [phone].

## 2021-10-17 ENCOUNTER — Other Ambulatory Visit: Payer: Self-pay

## 2021-10-17 ENCOUNTER — Ambulatory Visit (INDEPENDENT_AMBULATORY_CARE_PROVIDER_SITE_OTHER): Payer: BC Managed Care – PPO

## 2021-10-17 DIAGNOSIS — I480 Paroxysmal atrial fibrillation: Secondary | ICD-10-CM | POA: Diagnosis not present

## 2021-10-17 DIAGNOSIS — Z5181 Encounter for therapeutic drug level monitoring: Secondary | ICD-10-CM

## 2021-10-17 DIAGNOSIS — I4891 Unspecified atrial fibrillation: Secondary | ICD-10-CM

## 2021-10-17 LAB — POCT INR: INR: 3.5 — AB (ref 2.0–3.0)

## 2021-10-17 NOTE — Patient Instructions (Signed)
Description   Only take 1 tablet today and then START taking taking warfarin 2 tablets daily except for 3 tablets on Mondays. Be consistent with your greens (2-3 smoothies a week). Recheck INR in 2 weeks. Coumadin Clinic [phone].

## 2021-10-23 ENCOUNTER — Other Ambulatory Visit: Payer: Self-pay | Admitting: Nurse Practitioner

## 2021-10-23 DIAGNOSIS — J4521 Mild intermittent asthma with (acute) exacerbation: Secondary | ICD-10-CM

## 2021-10-23 MED ORDER — MONTELUKAST SODIUM 10 MG PO TABS: 10.0000 mg | ORAL_TABLET | Freq: Every day | ORAL | 0 refills | Status: DC

## 2021-10-31 ENCOUNTER — Other Ambulatory Visit: Payer: Self-pay

## 2021-10-31 ENCOUNTER — Ambulatory Visit (INDEPENDENT_AMBULATORY_CARE_PROVIDER_SITE_OTHER): Payer: BC Managed Care – PPO

## 2021-10-31 ENCOUNTER — Ambulatory Visit (HOSPITAL_COMMUNITY)
Admission: RE | Admit: 2021-10-31 | Discharge: 2021-10-31 | Disposition: A | Discharge status: Home / Self Care | Payer: BC Managed Care – PPO | Source: Ambulatory Visit | Attending: Physician Assistant | Admitting: Physician Assistant

## 2021-10-31 VITALS — BP 150/66 | HR 67 | Ht 66.0 in | Wt 228.4 lb

## 2021-10-31 DIAGNOSIS — Z6836 Body mass index (BMI) 36.0-36.9, adult: Secondary | ICD-10-CM | POA: Diagnosis not present

## 2021-10-31 DIAGNOSIS — J45909 Unspecified asthma, uncomplicated: Secondary | ICD-10-CM | POA: Insufficient documentation

## 2021-10-31 DIAGNOSIS — I480 Paroxysmal atrial fibrillation: Secondary | ICD-10-CM | POA: Diagnosis not present

## 2021-10-31 DIAGNOSIS — Z5181 Encounter for therapeutic drug level monitoring: Secondary | ICD-10-CM

## 2021-10-31 DIAGNOSIS — D6869 Other thrombophilia: Secondary | ICD-10-CM | POA: Insufficient documentation

## 2021-10-31 DIAGNOSIS — I4819 Other persistent atrial fibrillation: Secondary | ICD-10-CM | POA: Diagnosis not present

## 2021-10-31 DIAGNOSIS — I10 Essential (primary) hypertension: Secondary | ICD-10-CM | POA: Insufficient documentation

## 2021-10-31 DIAGNOSIS — Z79899 Other long term (current) drug therapy: Secondary | ICD-10-CM | POA: Diagnosis not present

## 2021-10-31 DIAGNOSIS — E669 Obesity, unspecified: Secondary | ICD-10-CM | POA: Insufficient documentation

## 2021-10-31 DIAGNOSIS — Z7901 Long term (current) use of anticoagulants: Secondary | ICD-10-CM | POA: Diagnosis not present

## 2021-10-31 DIAGNOSIS — I4891 Unspecified atrial fibrillation: Secondary | ICD-10-CM | POA: Diagnosis not present

## 2021-10-31 DIAGNOSIS — E119 Type 2 diabetes mellitus without complications: Secondary | ICD-10-CM | POA: Insufficient documentation

## 2021-10-31 DIAGNOSIS — Z8673 Personal history of transient ischemic attack (TIA), and cerebral infarction without residual deficits: Secondary | ICD-10-CM | POA: Insufficient documentation

## 2021-10-31 LAB — POCT INR: INR: 4.8 — AB (ref 2.0–3.0)

## 2021-10-31 NOTE — Progress Notes (Signed)
Primary Care Physician: [name], NP Primary Cardiologist: none Primary Electrophysiologist: Dr Rayann Heman Referring Physician: [facility]   Stacy Chapman is a 66 y.o. female with a history of HTN, DM, CVA, asthma, and paroxysmal atrial fibrillation who presents for follow up in the [facility].  The patient was initially diagnosed with atrial fibrillation remotely and had previously been on Xarelto per ER report. More recently, patient was seen at the ER 08/21/19 with symptoms of SOB and generalized weakness and was found by EMS to be in afib. She did not undergo DCCV as onset of afib was unclear and she was not on anticoagulation. She was discharged on Xarelto for a CHADS2VASC score of 5. Patient reports that she had two close family members pass away the day before her symptom onset. She does admit to alcohol use. She denies significant snoring. Patient wore a Zio patch which showed 100% afib burden which was rate controlled overall. She also had one episode of VT which was 12 beats. She denies any family history of sudden cardiac death and has no symptoms of syncope.   On follow up today, patient reports that she has done well since her last visit. She did have one episode of an irregular heart beat noted on her home BP machine several days ago. This lasted for several hours but she was completely asymptomatic.   Today, she denies symptoms of palpitations, chest pain, orthopnea, PND, lower extremity edema, dizziness, presyncope, syncope, snoring, daytime somnolence, bleeding, or neurologic sequela. The patient is tolerating medications without difficulties and is otherwise without complaint today.    Atrial Fibrillation Risk Factors:  she does not have symptoms or diagnosis of sleep apnea. she does not have a history of rheumatic fever. she does have a history of alcohol use. The patient does not have a history of early familial atrial  fibrillation or other arrhythmias.  she has a BMI of Body mass index is 36.86 kg/m.Marland Kitchen Filed Weights   10/31/21 1027  Weight: 103.6 kg    Family History  Problem Relation Age of Onset   Stroke Father 62       death   Hypertension Father    Dementia Sister    Diabetes Sister    Eczema Sister    Hypertension Brother    Stroke Brother 70       death   Cancer Brother        lung   Cancer Paternal Uncle        unknown   Dementia Sister    Colon cancer Neg Hx    Colon polyps Neg Hx    Stomach cancer Neg Hx    Rectal cancer Neg Hx    Esophageal cancer Neg Hx      Atrial Fibrillation Management history:  Previous antiarrhythmic drugs: flecainide Previous cardioversions: none Previous ablations: none CHADS2VASC score: 5 Anticoagulation history: Xarelto, warfarin    Past Medical History:  Diagnosis Date   Anemia    Arthritis    Asthma    Diabetes ([place])    Essential hypertension    Morbid obesity ([place])    Persistent atrial fibrillation ([place])    Stroke ([place]) 2018   TIA   Past Surgical History:  Procedure Laterality Date   KNEE ARTHROSCOPY     TONSILLECTOMY     TUBAL LIGATION  1991    Current Outpatient Medications  Medication Sig Dispense Refill   acetaminophen (TYLENOL) 500 MG tablet Take  1,000 mg by mouth as needed for moderate pain.     albuterol (VENTOLIN HFA) 108 (90 Base) MCG/ACT inhaler INHALE 1 PUFF INTO THE LUNGS EVERY 6 HOURS AS NEEDED 6.7 g 2   azelastine (ASTELIN) 0.1 % nasal spray USE 1 SPRAY IN EACH NOSTRIL TWICE DAILY 90 mL 0   diltiazem (CARDIZEM CD) 120 MG 24 hr capsule TAKE ONE CAPSULE BY MOUTH DAILY. KEEP APPT IN 11/22 FOR MORE REFILLS 90 capsule 2   flecainide (TAMBOCOR) 50 MG tablet Take 1.5 tablets (75 mg total) by mouth 2 (two) times daily. 45 tablet 0   furosemide (LASIX) 20 MG tablet TAKE 1 TABLET BY MOUTH DAILY AS NEEDED FOR SWELLING/WEIGHT GAIN 30 tablet 6   montelukast (SINGULAIR) 10 MG tablet Take 1 tablet (10 mg total) by mouth at  bedtime. 90 tablet 0   warfarin (COUMADIN) 5 MG tablet Take 2 tablets daily by mouth or as directed by the Anticoagulation Clinic. 60 tablet 1   fluticasone (FLONASE) 50 MCG/ACT nasal spray SHAKE LIQUID AND USE 2 SPRAYS IN EACH NOSTRIL DAILY (Patient not taking: Reported on 10/31/2021) 16 g 1   fluticasone-salmeterol (ADVAIR HFA) 115-21 MCG/ACT inhaler Inhale 1 puff into the lungs 2 (two) times daily. Rinse mouth after each use (Patient not taking: Reported on 08/26/2021) 1 each 5   rosuvastatin (CRESTOR) 10 MG tablet Take 1 tablet (10 mg total) by mouth daily. (Patient not taking: Reported on 10/31/2021) 90 tablet 3   No current facility-administered medications for this encounter.    Allergies  Allergen Reactions   Lisinopril Cough    Social History   Socioeconomic History   Marital status: Divorced    Spouse name: Not on file   Number of children: Not on file   Years of education: Not on file   Highest education level: Not on file  Occupational History   Not on file  Tobacco Use   Smoking status: Never   Smokeless tobacco: Never  Vaping Use   Vaping Use: Never used  Substance and Sexual Activity   Alcohol use: Not Currently    Alcohol/week: 6.0 standard drinks    Types: 1 Glasses of wine, 5 Standard drinks or equivalent per week    Comment: Drinks multiple times a week   Drug use: No   Sexual activity: Yes    Birth control/protection: Post-menopausal, Surgical    Comment: s/p tubal ligation  Other Topics Concern   Not on file  Social History Narrative   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  Physical Exam: Vitals:    10/31/21 1027  BP: (!) 150/66  Pulse: 67  Weight: 103.6 kg  Height: 5\' 6"  (1.676 m)    GEN- The patient is a well appearing obese female, alert and oriented x 3 today.   HEENT-head normocephalic, atraumatic, sclera clear, conjunctiva pink, hearing intact, trachea midline. Lungs- Clear to ausculation bilaterally, normal work of breathing Heart- Regular rate and rhythm, no murmurs, rubs or gallops  GI- soft, NT, ND, + BS Extremities- no clubbing, cyanosis, or edema MS- no significant deformity or atrophy Skin- no  rash or lesion Psych- euthymic mood, full affect Neuro- strength and sensation are intact   Wt Readings from Last 3 Encounters:  10/31/21 103.6 kg  08/26/21 105.7 kg  07/12/21 104.5 kg    EKG today demonstrates  SR Vent. rate 67 BPM PR interval 156 ms QRS duration 80 ms QT/QTcB 422/445 ms  Echo 09/12/19  1. Left ventricular ejection fraction, by visual estimation, is 55 to 60%. The left ventricle has normal function. There is no left ventricular hypertrophy.  2. Left ventricular diastolic parameters are indeterminate.  3. Global right ventricle has normal systolic function.The right ventricular size is normal. No increase in right ventricular wall thickness.  4. Left atrial size was mildly dilated.  5. Right atrial size was normal.  6. The mitral valve is normal in structure. Moderate mitral valve regurgitation. No evidence of mitral stenosis.  7. The tricuspid valve is normal in structure. Tricuspid valve regurgitation moderate.  8. The aortic valve is normal in structure. Aortic valve regurgitation is not visualized. No evidence of aortic valve sclerosis or stenosis.  9. The pulmonic valve was normal in structure. Pulmonic valve regurgitation is mild. 10. Mildly elevated pulmonary artery systolic pressure. 11. The inferior vena cava is normal in size with greater than 50% respiratory variability, suggesting right atrial pressure of 3 mmHg.   Epic records are  reviewed at length today  CHA2DS2-VASc Score = 6  The patient's score is based upon: CHF History: 0 HTN History: 1 Diabetes History: 1 Stroke History: 2 Vascular Disease History: 0 Age Score: 1 Gender Score: 1       ASSESSMENT AND PLAN: 1. Persistent Atrial Fibrillation (ICD10:  I48.19) The patient's CHA2DS2-VASc score is 6, indicating a 9.7% annual risk of stroke.   Patient appears to be maintaining SR. Continue flecainide 75 mg BID Continue diltiazem 120 mg daily Continue warfarin   2. Secondary Hypercoagulable State (ICD10:  D68.69) The patient is at significant risk for stroke/thromboembolism based upon her CHA2DS2-VASc Score of 6.  Continue Warfarin (Coumadin).   3. Obesity Body mass index is 36.86 kg/m. Lifestyle modification was discussed and encouraged including regular physical activity and weight reduction.  4. HTN Mildly elevated today, better on recheck Her BP has been well controlled at home and at previous visits. Will not make any changes today. Patient to continue to monitor at home.    Follow up in the AF clinic in 6 months.    [facility] [address] [place], [place] [address] [phone] 10/31/2021 11:07 AM

## 2021-10-31 NOTE — Patient Instructions (Signed)
Description   Hold today's dose and only take 1 tablet tomorrow and then START taking taking warfarin 2 tablets daily. Be consistent with your greens (2-3 times per week). Recheck INR in 2 weeks. Coumadin Clinic [phone].

## 2021-11-10 ENCOUNTER — Other Ambulatory Visit: Payer: Self-pay | Admitting: Internal Medicine

## 2021-11-11 ENCOUNTER — Other Ambulatory Visit: Payer: Self-pay | Admitting: Internal Medicine

## 2021-11-11 NOTE — Telephone Encounter (Signed)
Prescription refill request received for warfarin Lov: Stacy Chapman, 10/31/2021, Stacy Chapman Next INR check:  2/2 Warfarin tablet strength: 5mg    Refill sent.

## 2021-11-16 ENCOUNTER — Other Ambulatory Visit: Payer: Self-pay | Admitting: Nurse Practitioner

## 2021-11-16 DIAGNOSIS — J4521 Mild intermittent asthma with (acute) exacerbation: Secondary | ICD-10-CM

## 2021-11-19 MED ORDER — ALBUTEROL SULFATE HFA 108 (90 BASE) MCG/ACT IN AERS: INHALATION_SPRAY | RESPIRATORY_TRACT | 0 refills | Status: DC

## 2021-11-19 NOTE — Addendum Note (Signed)
Addended by: Wilfred Lacy L on: 11/19/2021 12:14 PM   Modules accepted: Orders

## 2021-11-27 ENCOUNTER — Ambulatory Visit (INDEPENDENT_AMBULATORY_CARE_PROVIDER_SITE_OTHER): Payer: BC Managed Care – PPO | Admitting: *Deleted

## 2021-11-27 ENCOUNTER — Other Ambulatory Visit: Payer: Self-pay

## 2021-11-27 DIAGNOSIS — Z5181 Encounter for therapeutic drug level monitoring: Secondary | ICD-10-CM

## 2021-11-27 DIAGNOSIS — I4891 Unspecified atrial fibrillation: Secondary | ICD-10-CM

## 2021-11-27 DIAGNOSIS — I480 Paroxysmal atrial fibrillation: Secondary | ICD-10-CM

## 2021-11-27 LAB — POCT INR: INR: 1.5 — AB (ref 2.0–3.0)

## 2021-11-27 NOTE — Patient Instructions (Signed)
Description   Today and tomorrow take 2.5 tablets then continue taking warfarin 2 tablets daily. Be consistent with your greens (2-3 times per week). Recheck INR in 2 weeks. Coumadin Clinic [phone].

## 2021-12-05 ENCOUNTER — Ambulatory Visit (INDEPENDENT_AMBULATORY_CARE_PROVIDER_SITE_OTHER): Payer: BC Managed Care – PPO | Admitting: *Deleted

## 2021-12-05 ENCOUNTER — Other Ambulatory Visit: Payer: Self-pay

## 2021-12-05 DIAGNOSIS — I4891 Unspecified atrial fibrillation: Secondary | ICD-10-CM

## 2021-12-05 DIAGNOSIS — I480 Paroxysmal atrial fibrillation: Secondary | ICD-10-CM | POA: Diagnosis not present

## 2021-12-05 DIAGNOSIS — Z5181 Encounter for therapeutic drug level monitoring: Secondary | ICD-10-CM

## 2021-12-05 LAB — POCT INR: INR: 1.3 — AB (ref 2.0–3.0)

## 2021-12-05 NOTE — Patient Instructions (Addendum)
Description   Today take 2.5 tablets and tomorrow take 2.5 tablets then start taking the dose you should be taking, which is, warfarin 2 tablets daily. Be consistent with your leafy veggies/smoothie (twice a week).  Recheck INR in 1 week. Coumadin Clinic [phone].

## 2021-12-12 ENCOUNTER — Encounter: Payer: Self-pay | Admitting: *Deleted

## 2021-12-12 ENCOUNTER — Ambulatory Visit (INDEPENDENT_AMBULATORY_CARE_PROVIDER_SITE_OTHER): Payer: BC Managed Care – PPO | Admitting: *Deleted

## 2021-12-12 ENCOUNTER — Other Ambulatory Visit: Payer: Self-pay

## 2021-12-12 DIAGNOSIS — I480 Paroxysmal atrial fibrillation: Secondary | ICD-10-CM

## 2021-12-12 DIAGNOSIS — Z5181 Encounter for therapeutic drug level monitoring: Secondary | ICD-10-CM | POA: Diagnosis not present

## 2021-12-12 DIAGNOSIS — I4891 Unspecified atrial fibrillation: Secondary | ICD-10-CM

## 2021-12-12 LAB — POCT INR: INR: 2.6 (ref 2.0–3.0)

## 2021-12-12 NOTE — Patient Instructions (Signed)
Description   ?Continue taking warfarin 2 tablets daily. Be consistent with your leafy veggies twice a week and fruit smoothies.  Recheck INR in 2 weeks. [facility]in Clinic [phone].  ?  ?  ?

## 2021-12-26 ENCOUNTER — Ambulatory Visit (INDEPENDENT_AMBULATORY_CARE_PROVIDER_SITE_OTHER): Payer: BC Managed Care – PPO | Admitting: *Deleted

## 2021-12-26 ENCOUNTER — Other Ambulatory Visit: Payer: Self-pay

## 2021-12-26 DIAGNOSIS — Z5181 Encounter for therapeutic drug level monitoring: Secondary | ICD-10-CM | POA: Diagnosis not present

## 2021-12-26 DIAGNOSIS — I4891 Unspecified atrial fibrillation: Secondary | ICD-10-CM

## 2021-12-26 DIAGNOSIS — I480 Paroxysmal atrial fibrillation: Secondary | ICD-10-CM | POA: Diagnosis not present

## 2021-12-26 LAB — PROTIME-INR
INR: 6.5 (ref 0.9–1.2)
Prothrombin Time: 61 s — ABNORMAL HIGH (ref 9.1–12.0)

## 2021-12-26 LAB — POCT INR: INR: 6.9 — AB (ref 2.0–3.0)

## 2021-12-26 NOTE — Patient Instructions (Addendum)
Description   ?INR-6.5; Spoke with pt and instructed pt to hold her warfarin Friday (already taken today), hold warfarin Saturday, hold warfarin Sunday then start taking 2 tablets daily except 1 tablet on Mondays. Recheck INR in 1 week. ?Have leafy veggies today and Be consistent with your leafy veggies twice a week and fruit smoothies. Coumadin Clinic [phone].  ?  ?  ? ?

## 2022-01-02 ENCOUNTER — Other Ambulatory Visit: Payer: Self-pay

## 2022-01-02 ENCOUNTER — Ambulatory Visit (INDEPENDENT_AMBULATORY_CARE_PROVIDER_SITE_OTHER): Payer: BC Managed Care – PPO | Admitting: *Deleted

## 2022-01-02 DIAGNOSIS — I4891 Unspecified atrial fibrillation: Secondary | ICD-10-CM | POA: Diagnosis not present

## 2022-01-02 DIAGNOSIS — Z5181 Encounter for therapeutic drug level monitoring: Secondary | ICD-10-CM | POA: Diagnosis not present

## 2022-01-02 DIAGNOSIS — I480 Paroxysmal atrial fibrillation: Secondary | ICD-10-CM | POA: Diagnosis not present

## 2022-01-02 LAB — POCT INR: INR: 1.4 — AB (ref 2.0–3.0)

## 2022-01-02 NOTE — Patient Instructions (Signed)
Description   ?Today take 2.5 tablets then continue taking 2 tablets daily except 1 tablet on Mondays. Recheck INR in 1 week. Have leafy veggies today and Be consistent with your leafy veggies twice a week and fruit smoothies. Coumadin Clinic [phone].  ?  ?  ?

## 2022-01-09 ENCOUNTER — Ambulatory Visit (INDEPENDENT_AMBULATORY_CARE_PROVIDER_SITE_OTHER): Payer: BC Managed Care – PPO | Admitting: *Deleted

## 2022-01-09 DIAGNOSIS — I480 Paroxysmal atrial fibrillation: Secondary | ICD-10-CM | POA: Diagnosis not present

## 2022-01-09 DIAGNOSIS — Z5181 Encounter for therapeutic drug level monitoring: Secondary | ICD-10-CM | POA: Diagnosis not present

## 2022-01-09 DIAGNOSIS — I4891 Unspecified atrial fibrillation: Secondary | ICD-10-CM

## 2022-01-09 LAB — POCT INR: INR: 2.2 (ref 2.0–3.0)

## 2022-01-09 NOTE — Patient Instructions (Signed)
Description   ?Continue taking 2 tablets daily except 1 tablet on Mondays. Recheck INR in 1 week. Have leafy veggies today and Be consistent with your leafy veggies twice a week and fruit smoothies. Coumadin Clinic [phone].  ?  ?  ?

## 2022-01-10 ENCOUNTER — Ambulatory Visit: Payer: BC Managed Care – PPO | Admitting: Nurse Practitioner

## 2022-01-10 ENCOUNTER — Encounter: Payer: Self-pay | Admitting: Nurse Practitioner

## 2022-01-10 VITALS — BP 128/72 | HR 70 | Temp 97.0°F | Ht 66.0 in | Wt 230.6 lb

## 2022-01-10 DIAGNOSIS — E1169 Type 2 diabetes mellitus with other specified complication: Secondary | ICD-10-CM

## 2022-01-10 DIAGNOSIS — I10 Essential (primary) hypertension: Secondary | ICD-10-CM

## 2022-01-10 DIAGNOSIS — E785 Hyperlipidemia, unspecified: Secondary | ICD-10-CM

## 2022-01-10 DIAGNOSIS — Z23 Encounter for immunization: Secondary | ICD-10-CM | POA: Diagnosis not present

## 2022-01-10 DIAGNOSIS — Z780 Asymptomatic menopausal state: Secondary | ICD-10-CM | POA: Diagnosis not present

## 2022-01-10 LAB — CBC
HCT: 36.1 % (ref 36.0–46.0)
Hemoglobin: 11.6 g/dL — ABNORMAL LOW (ref 12.0–15.0)
MCHC: 32.1 g/dL (ref 30.0–36.0)
MCV: 86 fl (ref 78.0–100.0)
Platelets: 223 10*3/uL (ref 150.0–400.0)
RBC: 4.2 Mil/uL (ref 3.87–5.11)
RDW: 14.7 % (ref 11.5–15.5)
WBC: 4.6 10*3/uL (ref 4.0–10.5)

## 2022-01-10 LAB — HEMOGLOBIN A1C: Hgb A1c MFr Bld: 6.3 % (ref 4.6–6.5)

## 2022-01-10 LAB — BASIC METABOLIC PANEL
BUN: 35 mg/dL — ABNORMAL HIGH (ref 6–23)
CO2: 27 mEq/L (ref 19–32)
Calcium: 9.4 mg/dL (ref 8.4–10.5)
Chloride: 104 mEq/L (ref 96–112)
Creatinine, Ser: 0.96 mg/dL (ref 0.40–1.20)
GFR: 62.17 mL/min (ref >60.00)
Glucose, Bld: 94 mg/dL (ref 70–99)
Potassium: 4.8 mEq/L (ref 3.5–5.1)
Sodium: 136 mEq/L (ref 135–145)

## 2022-01-10 MED ORDER — ROSUVASTATIN CALCIUM 10 MG PO TABS: 10.0000 mg | ORAL_TABLET | Freq: Every day | ORAL | 3 refills | Status: DC

## 2022-01-10 NOTE — Progress Notes (Signed)
? ?             Established Patient Visit ? ?Patient: Stacy Chapman   DOB: [birth year]   66 y.o. Female  MRN: [id number] ?Visit Date: 01/10/2022 ? ?Subjective:  ?  ?Chief Complaint  ?Patient presents with  ? Follow-up  ?  6 month f/u on HTN, DM, and cholesterol.  ?Shingles vaccine given today. ?Pt has questions regarding pneumonia vaccine.   ? ?HPI ?HTN (hypertension), benign ?BP at goal ?BP Readings from Last 3 Encounters:  ?01/10/22 128/72  ?10/31/21 (!) 150/66  ?08/26/21 128/76  ? ?Maintain med doses ? ?DM (diabetes mellitus) ([place]) ?Controlled with diet ?Repeat hgbA1c ?Agreed to prevnar and shingrix vaccine ? ? ?Mixed hyperlipidemia ?We discussed need for statin use to help decrease her risk for cardiovascular disease. She verbalized understanding and agreed to start crestor. ?rx sent ? ?Reviewed medical, surgical, and social history today ? ?Medications: ?Outpatient Medications Prior to Visit  ?Medication Sig  ? acetaminophen (TYLENOL) 500 MG tablet Take 1,000 mg by mouth as needed for moderate pain.  ? albuterol (VENTOLIN HFA) 108 (90 Base) MCG/ACT inhaler Need office visit for addiitonal refills  ? azelastine (ASTELIN) 0.1 % nasal spray USE 1 SPRAY IN EACH NOSTRIL TWICE DAILY  ? diltiazem (CARDIZEM CD) 120 MG 24 hr capsule TAKE ONE CAPSULE BY MOUTH DAILY. KEEP APPT IN 11/22 FOR MORE REFILLS  ? flecainide (TAMBOCOR) 50 MG tablet Take 1.5 tablets (75 mg total) by mouth 2 (two) times daily.  ? fluticasone (FLONASE) 50 MCG/ACT nasal spray SHAKE LIQUID AND USE 2 SPRAYS IN EACH NOSTRIL DAILY  ? furosemide (LASIX) 20 MG tablet TAKE 1 TABLET BY MOUTH DAILY AS NEEDED FOR SWELLING/WEIGHT GAIN  ? montelukast (SINGULAIR) 10 MG tablet Take 1 tablet (10 mg total) by mouth at bedtime.  ? warfarin (COUMADIN) 5 MG tablet TAKE 2 TABLETS BY MOUTH DAILY AS DIRECTED BY COAGULATION CLINIC  ? [DISCONTINUED] fluticasone-salmeterol (ADVAIR HFA) 115-21 MCG/ACT inhaler Inhale 1 puff into the lungs 2 (two) times daily. Rinse mouth  after each use (Patient not taking: Reported on 08/26/2021)  ? [DISCONTINUED] rosuvastatin (CRESTOR) 10 MG tablet Take 1 tablet (10 mg total) by mouth daily. (Patient not taking: Reported on 10/31/2021)  ? ?No facility-administered medications prior to visit.  ? ?Reviewed past medical and social history.  ? ?ROS per HPI above ? ? ?   ?Objective:  ?BP 128/72 (BP Location: Left Arm, Patient Position: Sitting, Cuff Size: Large)   Pulse 70   Temp (!) 97 ?F (36.1 ?C) (Temporal)   Ht '5\' 6"'$  (1.676 m)   Wt 230 lb 9.6 oz (104.6 kg)   SpO2 98%   BMI 37.22 kg/m?  ? ?  ? ?Physical Exam ?Constitutional:   ?   Appearance: She is obese.  ?Cardiovascular:  ?   Rate and Rhythm: Normal rate and regular rhythm.  ?   Pulses: Normal pulses.  ?   Heart sounds: Normal heart sounds.  ?Pulmonary:  ?   Effort: Pulmonary effort is normal.  ?   Breath sounds: Normal breath sounds.  ?Musculoskeletal:  ?   Right lower leg: No edema.  ?   Left lower leg: No edema.  ?Neurological:  ?   Mental Status: She is alert and oriented to person, place, and time.  ?  ?Results for orders placed or performed in visit on 01/10/22  ?Hemoglobin A1c  ?Result Value Ref Range  ? Hgb A1c MFr Bld 6.3 4.6 - 6.5 %  ?Basic  metabolic panel  ?Result Value Ref Range  ? Sodium 136 135 - 145 mEq/L  ? Potassium 4.8 3.5 - 5.1 mEq/L  ? Chloride 104 96 - 112 mEq/L  ? CO2 27 19 - 32 mEq/L  ? Glucose, Bld 94 70 - 99 mg/dL  ? BUN 35 (H) 6 - 23 mg/dL  ? Creatinine, Ser 0.96 0.40 - 1.20 mg/dL  ? GFR 62.17 >60.00 mL/min  ? Calcium 9.4 8.4 - 10.5 mg/dL  ?CBC  ?Result Value Ref Range  ? WBC 4.6 4.0 - 10.5 K/uL  ? RBC 4.20 3.87 - 5.11 Mil/uL  ? Platelets 223.0 150.0 - 400.0 K/uL  ? Hemoglobin 11.6 (L) 12.0 - 15.0 g/dL  ? HCT 36.1 36.0 - 46.0 %  ? MCV 86.0 78.0 - 100.0 fl  ? MCHC 32.1 30.0 - 36.0 g/dL  ? RDW 14.7 11.5 - 15.5 %  ? ?   ?Assessment & Plan:  ?  ?Problem List Items Addressed This Visit   ? ?  ? Cardiovascular and Mediastinum  ? HTN (hypertension), benign  ?  BP at  goal ?BP Readings from Last 3 Encounters:  ?01/10/22 128/72  ?10/31/21 (!) 150/66  ?08/26/21 128/76  ? ?Maintain med doses ?  ?  ? Relevant Medications  ? rosuvastatin (CRESTOR) 10 MG tablet  ? Other Relevant Orders  ? Basic metabolic panel (Completed)  ? CBC (Completed)  ?  ? Endocrine  ? DM (diabetes mellitus) ([place]) - Primary  ?  Controlled with diet ?Repeat hgbA1c ?Agreed to prevnar and shingrix vaccine ? ?  ?  ? Relevant Medications  ? rosuvastatin (CRESTOR) 10 MG tablet  ? Other Relevant Orders  ? Hemoglobin A1c (Completed)  ? Basic metabolic panel (Completed)  ?  ? Other  ? Mixed hyperlipidemia  ?  We discussed need for statin use to help decrease her risk for cardiovascular disease. She verbalized understanding and agreed to start crestor. ?rx sent ?  ?  ? Relevant Medications  ? rosuvastatin (CRESTOR) 10 MG tablet  ? ?Other Visit Diagnoses   ? ? Asymptomatic age-related postmenopausal state      ? Relevant Orders  ? DG Bone Density  ? Need for pneumococcal vaccine      ? Relevant Orders  ? Pneumococcal conjugate vaccine 13-valent IM (Completed)  ? Need for shingles vaccine      ? Relevant Orders  ? Varicella-zoster vaccine IM (Completed)  ? ?  ? ?Return in about 6 months (around 07/12/2022) for DM and HTN, hyperlipidemia. ? ?  ? ?Wilfred Lacy, NP ? ? ?

## 2022-01-10 NOTE — Assessment & Plan Note (Signed)
Controlled with diet ?Repeat hgbA1c ?Agreed to prevnar and shingrix vaccine ? ?

## 2022-01-10 NOTE — Assessment & Plan Note (Signed)
>>  ASSESSMENT AND PLAN FOR ATRIAL FIBRILLATION WITH RVR ([facility]) WRITTEN ON 10/16/2022  3:19 PM BY NCHE, CHARLOTTE LUM, NP  >>ASSESSMENT AND PLAN FOR HYPERTENSIVE DISORDER WRITTEN ON 01/10/2022 12:37 PM BY NCHE, CHARLOTTE LUM, NP  BP at goal BP Readings from Last 3 Encounters:  01/10/22 128/72  10/31/21 (!) 150/66  08/26/21 128/76   Maintain med doses

## 2022-01-10 NOTE — Patient Instructions (Signed)
Go to lab for blood draw ? ?Schedule appt for mammogram and dexa scan in November. ?

## 2022-01-10 NOTE — Assessment & Plan Note (Signed)
BP at goal ?BP Readings from Last 3 Encounters:  ?01/10/22 128/72  ?10/31/21 (!) 150/66  ?08/26/21 128/76  ? ?Maintain med doses ?

## 2022-01-10 NOTE — Assessment & Plan Note (Signed)
>>  ASSESSMENT AND PLAN FOR HYPERTENSIVE DISORDER WRITTEN ON 01/10/2022 12:37 PM BY Lizet Kelso LUM, NP  BP at goal BP Readings from Last 3 Encounters:  01/10/22 128/72  10/31/21 (!) 150/66  08/26/21 128/76   Maintain med doses

## 2022-01-10 NOTE — Assessment & Plan Note (Signed)
We discussed need for statin use to help decrease her risk for cardiovascular disease. She verbalized understanding and agreed to start crestor. ?rx sent ?

## 2022-01-16 ENCOUNTER — Ambulatory Visit (INDEPENDENT_AMBULATORY_CARE_PROVIDER_SITE_OTHER): Payer: BC Managed Care – PPO

## 2022-01-16 DIAGNOSIS — I480 Paroxysmal atrial fibrillation: Secondary | ICD-10-CM

## 2022-01-16 DIAGNOSIS — Z5181 Encounter for therapeutic drug level monitoring: Secondary | ICD-10-CM | POA: Diagnosis not present

## 2022-01-16 DIAGNOSIS — I4891 Unspecified atrial fibrillation: Secondary | ICD-10-CM | POA: Diagnosis not present

## 2022-01-16 LAB — POCT INR: INR: 3.4 — AB (ref 2.0–3.0)

## 2022-01-16 NOTE — Patient Instructions (Signed)
Description   ?Only take 1 tablet today and then continue taking 2 tablets daily except 1 tablet on Mondays. Recheck INR in 1 week. Have leafy veggies today and Be consistent with your leafy veggies twice a week and fruit smoothies. Coumadin Clinic [phone].  ?  ?   ?

## 2022-01-23 ENCOUNTER — Ambulatory Visit (INDEPENDENT_AMBULATORY_CARE_PROVIDER_SITE_OTHER): Payer: BC Managed Care – PPO

## 2022-01-23 DIAGNOSIS — I4891 Unspecified atrial fibrillation: Secondary | ICD-10-CM

## 2022-01-23 DIAGNOSIS — I480 Paroxysmal atrial fibrillation: Secondary | ICD-10-CM

## 2022-01-23 DIAGNOSIS — Z5181 Encounter for therapeutic drug level monitoring: Secondary | ICD-10-CM

## 2022-01-23 LAB — POCT INR: INR: 2.9 (ref 2.0–3.0)

## 2022-01-23 NOTE — Patient Instructions (Signed)
Description   ?Continue taking 2 tablets daily except 1 tablet on Mondays.  ?Recheck INR in 2 weeks. ?Be consistent with your leafy veggies and fruit smoothies (2-3 servings per week).  ?[facility]madin Clinic [phone].  ?  ?   ?

## 2022-01-27 ENCOUNTER — Other Ambulatory Visit: Payer: Self-pay | Admitting: Nurse Practitioner

## 2022-01-27 ENCOUNTER — Other Ambulatory Visit: Payer: Self-pay | Admitting: Internal Medicine

## 2022-01-27 DIAGNOSIS — J4521 Mild intermittent asthma with (acute) exacerbation: Secondary | ICD-10-CM

## 2022-01-27 MED ORDER — FLECAINIDE ACETATE 50 MG PO TABS: 75.0000 mg | ORAL_TABLET | Freq: Two times a day (BID) | ORAL | 1 refills | Status: DC

## 2022-01-27 MED ORDER — MONTELUKAST SODIUM 10 MG PO TABS: 10.0000 mg | ORAL_TABLET | Freq: Every day | ORAL | 0 refills | Status: DC

## 2022-01-31 ENCOUNTER — Other Ambulatory Visit: Payer: Self-pay | Admitting: Internal Medicine

## 2022-01-31 DIAGNOSIS — I480 Paroxysmal atrial fibrillation: Secondary | ICD-10-CM

## 2022-02-06 ENCOUNTER — Ambulatory Visit (INDEPENDENT_AMBULATORY_CARE_PROVIDER_SITE_OTHER): Payer: BC Managed Care – PPO

## 2022-02-06 DIAGNOSIS — I4891 Unspecified atrial fibrillation: Secondary | ICD-10-CM | POA: Diagnosis not present

## 2022-02-06 DIAGNOSIS — Z5181 Encounter for therapeutic drug level monitoring: Secondary | ICD-10-CM

## 2022-02-06 DIAGNOSIS — I480 Paroxysmal atrial fibrillation: Secondary | ICD-10-CM | POA: Diagnosis not present

## 2022-02-06 LAB — POCT INR: INR: 2.9 (ref 2.0–3.0)

## 2022-02-06 NOTE — Patient Instructions (Signed)
Description   ?Continue taking 2 tablets daily except 1 tablet on Mondays.  ?Recheck INR in 3 weeks. ?Be consistent with your leafy veggies and fruit smoothies (2-3 servings per week).  ?[facility]in Clinic [phone].  ?  ?   ?

## 2022-02-16 ENCOUNTER — Other Ambulatory Visit: Payer: Self-pay | Admitting: Internal Medicine

## 2022-02-16 DIAGNOSIS — I480 Paroxysmal atrial fibrillation: Secondary | ICD-10-CM

## 2022-02-17 NOTE — Telephone Encounter (Signed)
Received refill request for warfarin: ? ?Last INR was 2.9 on 02/06/22 ?Next INR is 02/27/22 ?Last OV was 10/31/21 ? ?Refill approved. ?

## 2022-02-27 ENCOUNTER — Ambulatory Visit (INDEPENDENT_AMBULATORY_CARE_PROVIDER_SITE_OTHER): Payer: BC Managed Care – PPO

## 2022-02-27 DIAGNOSIS — I4891 Unspecified atrial fibrillation: Secondary | ICD-10-CM

## 2022-02-27 DIAGNOSIS — Z5181 Encounter for therapeutic drug level monitoring: Secondary | ICD-10-CM | POA: Diagnosis not present

## 2022-02-27 DIAGNOSIS — I480 Paroxysmal atrial fibrillation: Secondary | ICD-10-CM

## 2022-02-27 LAB — POCT INR: INR: 4.5 — AB (ref 2.0–3.0)

## 2022-02-27 NOTE — Patient Instructions (Signed)
Description   Hold tomorrow's dose and only take 1 tablet on Saturday and then continue taking 2 tablets daily except 1 tablet on Mondays.  Recheck INR in 1 week. Be consistent with your leafy veggies and fruit smoothies (2-3 servings per week).  C[facility]madin Clinic [phone].

## 2022-03-06 ENCOUNTER — Ambulatory Visit (INDEPENDENT_AMBULATORY_CARE_PROVIDER_SITE_OTHER): Payer: BC Managed Care – PPO | Admitting: *Deleted

## 2022-03-06 DIAGNOSIS — Z5181 Encounter for therapeutic drug level monitoring: Secondary | ICD-10-CM | POA: Diagnosis not present

## 2022-03-06 DIAGNOSIS — I4891 Unspecified atrial fibrillation: Secondary | ICD-10-CM

## 2022-03-06 DIAGNOSIS — I480 Paroxysmal atrial fibrillation: Secondary | ICD-10-CM | POA: Diagnosis not present

## 2022-03-06 LAB — POCT INR: INR: 2.6 (ref 2.0–3.0)

## 2022-03-06 NOTE — Patient Instructions (Signed)
Description   Continue taking 2 tablets daily except 1 tablet on Mondays. Recheck INR in 2 weeks. Be consistent with your leafy veggies and fruit smoothies (2-3 servings per week). [facility]in Clinic [phone].

## 2022-03-20 ENCOUNTER — Ambulatory Visit (INDEPENDENT_AMBULATORY_CARE_PROVIDER_SITE_OTHER): Payer: BC Managed Care – PPO

## 2022-03-20 DIAGNOSIS — Z5181 Encounter for therapeutic drug level monitoring: Secondary | ICD-10-CM

## 2022-03-20 DIAGNOSIS — I480 Paroxysmal atrial fibrillation: Secondary | ICD-10-CM

## 2022-03-20 LAB — POCT INR: INR: 5.3 — AB (ref 2.0–3.0)

## 2022-03-20 NOTE — Patient Instructions (Signed)
HOLD TODAY AND FRIDAY and then Continue taking 2 tablets daily except 1 tablet on Mondays. Recheck INR in 1 week. Be consistent with your leafy veggies and fruit smoothies (2-3 servings per week). [facility]in Clinic [phone].

## 2022-03-23 DIAGNOSIS — S29012A Strain of muscle and tendon of back wall of thorax, initial encounter: Secondary | ICD-10-CM | POA: Diagnosis not present

## 2022-03-27 ENCOUNTER — Ambulatory Visit (INDEPENDENT_AMBULATORY_CARE_PROVIDER_SITE_OTHER): Payer: BC Managed Care – PPO

## 2022-03-27 DIAGNOSIS — M25511 Pain in right shoulder: Secondary | ICD-10-CM | POA: Diagnosis not present

## 2022-03-27 DIAGNOSIS — I4891 Unspecified atrial fibrillation: Secondary | ICD-10-CM

## 2022-03-27 DIAGNOSIS — Z5181 Encounter for therapeutic drug level monitoring: Secondary | ICD-10-CM

## 2022-03-27 DIAGNOSIS — I480 Paroxysmal atrial fibrillation: Secondary | ICD-10-CM | POA: Diagnosis not present

## 2022-03-27 LAB — POCT INR: INR: 4 — AB (ref 2.0–3.0)

## 2022-03-27 NOTE — Patient Instructions (Signed)
Description   Only take 1 tablet today and then START taking 2 tablets daily except 1 tablet on Mondays and Thursdays.  Recheck INR in 1 week.  Be consistent with your leafy veggies and fruit smoothies (2-3 servings per week).  [facility] [phone].

## 2022-04-03 ENCOUNTER — Ambulatory Visit (INDEPENDENT_AMBULATORY_CARE_PROVIDER_SITE_OTHER): Payer: BC Managed Care – PPO | Admitting: *Deleted

## 2022-04-03 DIAGNOSIS — I4891 Unspecified atrial fibrillation: Secondary | ICD-10-CM

## 2022-04-03 DIAGNOSIS — Z5181 Encounter for therapeutic drug level monitoring: Secondary | ICD-10-CM

## 2022-04-03 DIAGNOSIS — I480 Paroxysmal atrial fibrillation: Secondary | ICD-10-CM | POA: Diagnosis not present

## 2022-04-03 LAB — POCT INR: INR: 4.9 — AB (ref 2.0–3.0)

## 2022-04-03 NOTE — Patient Instructions (Signed)
Description   Do not take any warfarin today and hold warfarin tomorrow then START taking 2 tablets daily except 1 tablet on Mondays, Thursdays, and Saturdays.  Recheck INR in 1 week.  Be consistent with your leafy veggies and fruit smoothies (2-3 servings per week).  [facility]in Clinic [phone] or [phone]

## 2022-04-08 DIAGNOSIS — M542 Cervicalgia: Secondary | ICD-10-CM | POA: Diagnosis not present

## 2022-04-08 DIAGNOSIS — M25511 Pain in right shoulder: Secondary | ICD-10-CM | POA: Diagnosis not present

## 2022-04-10 ENCOUNTER — Ambulatory Visit (INDEPENDENT_AMBULATORY_CARE_PROVIDER_SITE_OTHER): Payer: BC Managed Care – PPO | Admitting: *Deleted

## 2022-04-10 DIAGNOSIS — Z5181 Encounter for therapeutic drug level monitoring: Secondary | ICD-10-CM | POA: Diagnosis not present

## 2022-04-10 DIAGNOSIS — I480 Paroxysmal atrial fibrillation: Secondary | ICD-10-CM | POA: Diagnosis not present

## 2022-04-10 DIAGNOSIS — I4891 Unspecified atrial fibrillation: Secondary | ICD-10-CM | POA: Diagnosis not present

## 2022-04-10 LAB — POCT INR: INR: 3.5 — AB (ref 2.0–3.0)

## 2022-04-10 NOTE — Patient Instructions (Signed)
Description   Do not take any warfarin today then START taking the dose you should be taking, which is, 2 tablets daily except 1 tablet on Mondays, Thursdays, and Saturdays.  Recheck INR in 1 week.  Be consistent with your leafy veggies and fruit smoothies (2-3 servings per week).  [facility]in Clinic [phone] or [phone]

## 2022-04-20 DIAGNOSIS — M542 Cervicalgia: Secondary | ICD-10-CM | POA: Diagnosis not present

## 2022-04-22 DIAGNOSIS — M542 Cervicalgia: Secondary | ICD-10-CM | POA: Diagnosis not present

## 2022-04-22 DIAGNOSIS — M5412 Radiculopathy, cervical region: Secondary | ICD-10-CM | POA: Diagnosis not present

## 2022-04-23 ENCOUNTER — Ambulatory Visit (INDEPENDENT_AMBULATORY_CARE_PROVIDER_SITE_OTHER): Payer: BC Managed Care – PPO

## 2022-04-23 ENCOUNTER — Telehealth: Payer: Self-pay | Admitting: Student

## 2022-04-23 DIAGNOSIS — Z5181 Encounter for therapeutic drug level monitoring: Secondary | ICD-10-CM

## 2022-04-23 DIAGNOSIS — I480 Paroxysmal atrial fibrillation: Secondary | ICD-10-CM | POA: Diagnosis not present

## 2022-04-23 DIAGNOSIS — I4891 Unspecified atrial fibrillation: Secondary | ICD-10-CM | POA: Diagnosis not present

## 2022-04-23 LAB — PROTIME-INR
INR: 7.6 (ref 0.9–1.2)
Prothrombin Time: 71.5 s — ABNORMAL HIGH (ref 9.1–12.0)

## 2022-04-23 NOTE — Telephone Encounter (Signed)
   Received page from [facility] about a critical lab results. Patient was seen in the Coumadin clinic earlier today and INR was 8.0. She was sent for a STAT recheck and it was 7.6. PT 71.5. Patient was instructed earlier today to hold Coumadin and eat leafy greens and Coumadin clinic would call back tomorrow with additional instructions based off result of repeat INR. Called and notified patient of results of repeat lab and re-iterated above instructions. Advised her to go to the ED if she has any abnormal bleeding. Patient voiced understanding.  Will route this note to Coumadin clinic so they are aware.  Darreld Mclean, PA-C 04/23/2022 7:46 PM

## 2022-04-24 NOTE — Patient Instructions (Signed)
Description   POC INR >8.0: Sent pt to have STAT PT/INR drawn and instructed to HOLD Warfarin until labs are resulted. 7/12 @ 4:58 Called [facility]: lab was sent to [place] and is not resulted. Called pt and instructed to HOLD Warfarin and eat greens and Coumadin Clinic will call with dosing instructed in the morning.  7/13 '@918am'$ -mailbox full. Do not take any Warfarin Wednesday (7/12), No warfarin Thursday (7/13), No Warfarin Friday (7/14), then start taking 1 tablet daily except 2 tablets only on Sunday and Thursday. Eat leafy veggies today. Recheck INR in 1 week.  Be consistent with your leafy veggies and fruit smoothies (2-3 servings per week).  Coumadin Clinic [phone] or [phone]

## 2022-04-24 NOTE — Telephone Encounter (Signed)
Called pt in reference to her INR results from yesterday. There was no answer and voicemail is full. Anticoagulation Encounter has been started to document details regarding INR results/instructions. Will continue to call since unable to reach the pt. Also, per encounter on yesterday, pt is aware not to take any warfarin until we contact her with further instructions.

## 2022-04-29 DIAGNOSIS — M542 Cervicalgia: Secondary | ICD-10-CM | POA: Diagnosis not present

## 2022-04-29 DIAGNOSIS — M5412 Radiculopathy, cervical region: Secondary | ICD-10-CM | POA: Diagnosis not present

## 2022-04-30 DIAGNOSIS — M4802 Spinal stenosis, cervical region: Secondary | ICD-10-CM | POA: Diagnosis not present

## 2022-04-30 DIAGNOSIS — D6869 Other thrombophilia: Secondary | ICD-10-CM | POA: Diagnosis not present

## 2022-04-30 DIAGNOSIS — I4891 Unspecified atrial fibrillation: Secondary | ICD-10-CM | POA: Diagnosis not present

## 2022-05-01 ENCOUNTER — Ambulatory Visit (HOSPITAL_COMMUNITY)
Admission: RE | Admit: 2022-05-01 | Discharge: 2022-05-01 | Disposition: A | Discharge status: Home / Self Care | Payer: BC Managed Care – PPO | Source: Ambulatory Visit | Attending: Physician Assistant | Admitting: Physician Assistant

## 2022-05-01 ENCOUNTER — Ambulatory Visit (INDEPENDENT_AMBULATORY_CARE_PROVIDER_SITE_OTHER): Payer: BC Managed Care – PPO

## 2022-05-01 ENCOUNTER — Encounter (HOSPITAL_COMMUNITY): Payer: Self-pay | Admitting: Physician Assistant

## 2022-05-01 VITALS — BP 116/80 | HR 78 | Ht 66.0 in | Wt 219.8 lb

## 2022-05-01 DIAGNOSIS — I480 Paroxysmal atrial fibrillation: Secondary | ICD-10-CM

## 2022-05-01 DIAGNOSIS — Z8673 Personal history of transient ischemic attack (TIA), and cerebral infarction without residual deficits: Secondary | ICD-10-CM | POA: Insufficient documentation

## 2022-05-01 DIAGNOSIS — E118 Type 2 diabetes mellitus with unspecified complications: Secondary | ICD-10-CM | POA: Diagnosis not present

## 2022-05-01 DIAGNOSIS — I4819 Other persistent atrial fibrillation: Secondary | ICD-10-CM | POA: Diagnosis not present

## 2022-05-01 DIAGNOSIS — Z79899 Other long term (current) drug therapy: Secondary | ICD-10-CM | POA: Insufficient documentation

## 2022-05-01 DIAGNOSIS — I4891 Unspecified atrial fibrillation: Secondary | ICD-10-CM

## 2022-05-01 DIAGNOSIS — D6869 Other thrombophilia: Secondary | ICD-10-CM | POA: Insufficient documentation

## 2022-05-01 DIAGNOSIS — Z5181 Encounter for therapeutic drug level monitoring: Secondary | ICD-10-CM

## 2022-05-01 DIAGNOSIS — I10 Essential (primary) hypertension: Secondary | ICD-10-CM | POA: Diagnosis not present

## 2022-05-01 DIAGNOSIS — J45909 Unspecified asthma, uncomplicated: Secondary | ICD-10-CM | POA: Insufficient documentation

## 2022-05-01 DIAGNOSIS — Z7901 Long term (current) use of anticoagulants: Secondary | ICD-10-CM | POA: Insufficient documentation

## 2022-05-01 DIAGNOSIS — E669 Obesity, unspecified: Secondary | ICD-10-CM | POA: Diagnosis not present

## 2022-05-01 DIAGNOSIS — Z6835 Body mass index (BMI) 35.0-35.9, adult: Secondary | ICD-10-CM | POA: Diagnosis not present

## 2022-05-01 LAB — POCT INR: INR: 2 (ref 2.0–3.0)

## 2022-05-01 NOTE — Patient Instructions (Signed)
Description   Take 2.5 tablets today and then continue taking 1 tablet daily except 2 tablets only on Sundays and Thursdays. Recheck INR in 2 weeks.   Be consistent with your leafy veggies and fruit smoothies (2-3 servings per week).  Coumadin Clinic [phone] or [phone]

## 2022-05-01 NOTE — Progress Notes (Signed)
Primary Care Physician: [name], NP Primary Cardiologist: none Primary Electrophysiologist: Dr Rayann Heman Referring Physician: [facility]   Stacy Chapman is a 66 y.o. female with a history of HTN, DM, CVA, asthma, and paroxysmal atrial fibrillation who presents for follow up in the [facility].  The patient was initially diagnosed with atrial fibrillation remotely and had previously been on Xarelto per ER report. More recently, patient was seen at the ER 08/21/19 with symptoms of SOB and generalized weakness and was found by EMS to be in afib. She did not undergo DCCV as onset of afib was unclear and she was not on anticoagulation. She was discharged on Xarelto for a CHADS2VASC score of 5. Patient reports that she had two close family members pass away the day before her symptom onset. She does admit to alcohol use. She denies significant snoring. Patient wore a Zio patch which showed 100% afib burden which was rate controlled overall. She also had one episode of VT which was 12 beats. She denies any family history of sudden cardiac death and has no symptoms of syncope.   On follow up today, patient reports that she has done well since her last visit. She has had rare palpitations but these lasted on a few seconds. No bleeding issues on anticoagulation. She has an INR check today to follow up on elevated INR.   Today, she denies symptoms of chest pain, orthopnea, PND, lower extremity edema, dizziness, presyncope, syncope, snoring, daytime somnolence, bleeding, or neurologic sequela. The patient is tolerating medications without difficulties and is otherwise without complaint today.    Atrial Fibrillation Risk Factors:  she does not have symptoms or diagnosis of sleep apnea. she does not have a history of rheumatic fever. she does have a history of alcohol use. The patient does not have a history of early familial atrial fibrillation or other  arrhythmias.  she has a BMI of Body mass index is 35.48 kg/m.Marland Kitchen Filed Weights   05/01/22 1033  Weight: 99.7 kg     Family History  Problem Relation Age of Onset   Stroke Father 10       death   Hypertension Father    Dementia Sister    Diabetes Sister    Eczema Sister    Hypertension Brother    Stroke Brother 42       death   Cancer Brother        lung   Cancer Paternal Uncle        unknown   Dementia Sister    Colon cancer Neg Hx    Colon polyps Neg Hx    Stomach cancer Neg Hx    Rectal cancer Neg Hx    Esophageal cancer Neg Hx      Atrial Fibrillation Management history:  Previous antiarrhythmic drugs: flecainide Previous cardioversions: none Previous ablations: none CHADS2VASC score: 5 Anticoagulation history: Xarelto, warfarin    Past Medical History:  Diagnosis Date   Anemia    Arthritis    Asthma    Diabetes ([place])    Essential hypertension    Morbid obesity ([place])    Persistent atrial fibrillation ([place])    Stroke ([place]) 2018   TIA   Past Surgical History:  Procedure Laterality Date   KNEE ARTHROSCOPY     TONSILLECTOMY     TUBAL LIGATION  1991    Current Outpatient Medications  Medication Sig Dispense Refill   acetaminophen (TYLENOL) 500 MG tablet Take  1,000 mg by mouth as needed for moderate pain.     albuterol (VENTOLIN HFA) 108 (90 Base) MCG/ACT inhaler Need office visit for addiitonal refills 6.7 g 0   azelastine (ASTELIN) 0.1 % nasal spray USE 1 SPRAY IN EACH NOSTRIL TWICE DAILY 90 mL 0   diltiazem (CARDIZEM CD) 120 MG 24 hr capsule TAKE ONE CAPSULE BY MOUTH DAILY. KEEP APPT IN 11/22 FOR MORE REFILLS 90 capsule 2   flecainide (TAMBOCOR) 50 MG tablet Take 1.5 tablets (75 mg total) by mouth 2 (two) times daily. 180 tablet 1   fluticasone (FLONASE) 50 MCG/ACT nasal spray SHAKE LIQUID AND USE 2 SPRAYS IN EACH NOSTRIL DAILY 16 g 1   furosemide (LASIX) 20 MG tablet TAKE 1 TABLET BY MOUTH DAILY AS NEEDED FOR SWELLING OR WEIGHT GAIN 30 tablet 5    gabapentin (NEURONTIN) 300 MG capsule Take 300 mg by mouth at bedtime.     montelukast (SINGULAIR) 10 MG tablet Take 1 tablet (10 mg total) by mouth at bedtime. 90 tablet 0   rosuvastatin (CRESTOR) 10 MG tablet Take 1 tablet (10 mg total) by mouth daily. 90 tablet 3   warfarin (COUMADIN) 5 MG tablet TAKE 2 TABLETS BY MOUTH DAILY AS DIRECTED BY COAGULATION CLINIC 180 tablet 1   No current facility-administered medications for this encounter.    Allergies  Allergen Reactions   Lisinopril Cough    Social History   Socioeconomic History   Marital status: Divorced    Spouse name: Not on file   Number of children: Not on file   Years of education: Not on file   Highest education level: Not on file  Occupational History   Not on file  Tobacco Use   Smoking status: Never   Smokeless tobacco: Never   Tobacco comments:    Never smoke 05/01/22  Vaping Use   Vaping Use: Never used  Substance and Sexual Activity   Alcohol use: Not Currently    Alcohol/week: 6.0 standard drinks of alcohol    Types: 1 Glasses of wine, 5 Standard drinks or equivalent per week    Comment: Drinks multiple times a week   Drug use: No   Sexual activity: Yes    Birth control/protection: Post-menopausal, Surgical    Comment: s/p tubal ligation  Other Topics Concern   Not on file  Social History Narrative   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  Physical Exam: Vitals:   05/01/22 1033  BP: 116/80  Pulse: 78  Weight: 99.7 kg  Height: '5\' 6"'$  (1.676 m)     GEN- The patient is a well appearing obese female, alert and oriented x 3 today.   HEENT-head  normocephalic, atraumatic, sclera clear, conjunctiva pink, hearing intact, trachea midline. Lungs- Clear to ausculation bilaterally, normal work of breathing Heart- Regular rate and rhythm, no murmurs, rubs or gallops  GI- soft, NT, ND, + BS Extremities- no clubbing, cyanosis, or edema MS- no significant deformity or atrophy Skin- no rash or lesion Psych- euthymic mood, full affect Neuro- strength and sensation are intact   Wt Readings from Last 3  Encounters:  05/01/22 99.7 kg  01/10/22 104.6 kg  10/31/21 103.6 kg    EKG today demonstrates  SR Vent. rate 78 BPM PR interval 156 ms QRS duration 82 ms QT/QTcB 370/421 ms  Echo 09/12/19  1. Left ventricular ejection fraction, by visual estimation, is 55 to 60%. The left ventricle has normal function. There is no left ventricular hypertrophy.  2. Left ventricular diastolic parameters are indeterminate.  3. Global right ventricle has normal systolic function.The right ventricular size is normal. No increase in right ventricular wall thickness.  4. Left atrial size was mildly dilated.  5. Right atrial size was normal.  6. The mitral valve is normal in structure. Moderate mitral valve regurgitation. No evidence of mitral stenosis.  7. The tricuspid valve is normal in structure. Tricuspid valve regurgitation moderate.  8. The aortic valve is normal in structure. Aortic valve regurgitation is not visualized. No evidence of aortic valve sclerosis or stenosis.  9. The pulmonic valve was normal in structure. Pulmonic valve regurgitation is mild. 10. Mildly elevated pulmonary artery systolic pressure. 11. The inferior vena cava is normal in size with greater than 50% respiratory variability, suggesting right atrial pressure of 3 mmHg.   Epic records are reviewed at length today  CHA2DS2-VASc Score = 6  The patient's score is based upon: CHF History: 0 HTN History: 1 Diabetes History: 1 Stroke History: 2 Vascular Disease History: 0 Age  Score: 1 Gender Score: 1       ASSESSMENT AND PLAN: 1. Persistent Atrial Fibrillation (ICD10:  I48.19) The patient's CHA2DS2-VASc score is 6, indicating a 9.7% annual risk of stroke.   Patient in [place] with only rare episodes.  Continue flecainide 75 mg BID Continue diltiazem 120 mg daily Continue warfarin   2. Secondary Hypercoagulable State (ICD10:  D68.69) The patient is at significant risk for stroke/thromboembolism based upon her CHA2DS2-VASc Score of 6.  Continue Warfarin (Coumadin).   3. Obesity Body mass index is 35.48 kg/m. Lifestyle modification was discussed and encouraged including regular physical activity and weight reduction.  4. HTN Stable, no changes today.   Follow up in the AF clinic in 6 months.    [facility] [address] [place], [place] [address] [phone] 05/01/2022 10:43 AM

## 2022-05-06 ENCOUNTER — Telehealth: Payer: Self-pay | Admitting: Nurse Practitioner

## 2022-05-06 NOTE — Telephone Encounter (Signed)
Caller Name: Stacy Chapman Call back phone #: [phone]  Reason for Call: Pt had an injury at work, has gotten an MRI and shows injury in neck. She has been going to [facility] after being directed from her workers comp, and now has been given permission to go through PCP after speaking about frustrations with [facility]. She has numbness in arm and hands and is unable to grip with her hands. Requested a call to speak about options.

## 2022-05-06 NOTE — Telephone Encounter (Signed)
Called & spoke w/ pt about her concerns and issues she is having with the numbness in her arms and hands, offered appt, pt states she will call back tomorrow to schedule an appt here b/c [facility] upset her today.

## 2022-05-15 ENCOUNTER — Ambulatory Visit (INDEPENDENT_AMBULATORY_CARE_PROVIDER_SITE_OTHER): Payer: BC Managed Care – PPO

## 2022-05-15 DIAGNOSIS — I4891 Unspecified atrial fibrillation: Secondary | ICD-10-CM

## 2022-05-15 DIAGNOSIS — I480 Paroxysmal atrial fibrillation: Secondary | ICD-10-CM

## 2022-05-15 DIAGNOSIS — Z5181 Encounter for therapeutic drug level monitoring: Secondary | ICD-10-CM

## 2022-05-15 LAB — POCT INR: INR: 1.6 — AB (ref 2.0–3.0)

## 2022-05-15 NOTE — Patient Instructions (Signed)
Description   Take 2.5 tablets today and then continue taking 1 tablet daily except 2 tablets only on Sundays and Thursdays. Recheck INR in 2 weeks.   Be consistent with your leafy veggies and fruit smoothies (2-3 servings per week).  [facility] [phone] or [phone]

## 2022-05-17 ENCOUNTER — Other Ambulatory Visit: Payer: Self-pay | Admitting: Internal Medicine

## 2022-05-20 NOTE — Telephone Encounter (Signed)
This is a A-Fib clinic pt 

## 2022-05-29 ENCOUNTER — Ambulatory Visit (INDEPENDENT_AMBULATORY_CARE_PROVIDER_SITE_OTHER): Payer: BC Managed Care – PPO

## 2022-05-29 DIAGNOSIS — I480 Paroxysmal atrial fibrillation: Secondary | ICD-10-CM | POA: Diagnosis not present

## 2022-05-29 DIAGNOSIS — Z5181 Encounter for therapeutic drug level monitoring: Secondary | ICD-10-CM

## 2022-05-29 LAB — POCT INR: INR: 2.4 (ref 2.0–3.0)

## 2022-05-29 NOTE — Patient Instructions (Signed)
continue taking 1 tablet daily except 2 tablets only on Sundays and Thursdays. Recheck INR in 4 weeks.   Be consistent with your leafy veggies and fruit smoothies (2-3 servings per week).  [facility] [phone] or [phone]

## 2022-06-04 DIAGNOSIS — M542 Cervicalgia: Secondary | ICD-10-CM | POA: Diagnosis not present

## 2022-06-13 LAB — HM DIABETES EYE EXAM

## 2022-06-26 ENCOUNTER — Ambulatory Visit: Payer: BC Managed Care – PPO | Attending: Internal Medicine

## 2022-06-26 DIAGNOSIS — I4891 Unspecified atrial fibrillation: Secondary | ICD-10-CM | POA: Diagnosis not present

## 2022-06-26 DIAGNOSIS — Z5181 Encounter for therapeutic drug level monitoring: Secondary | ICD-10-CM | POA: Diagnosis not present

## 2022-06-26 DIAGNOSIS — I480 Paroxysmal atrial fibrillation: Secondary | ICD-10-CM | POA: Diagnosis not present

## 2022-06-26 LAB — POCT INR: INR: 2.7 (ref 2.0–3.0)

## 2022-06-26 NOTE — Patient Instructions (Signed)
Description   Continue taking 1 tablet daily except 2 tablets only on Sundays and Thursdays. Recheck INR in 5 weeks.   Be consistent with your leafy veggies and fruit smoothies (2-3 servings per week).  [facility] [phone] or [phone]

## 2022-07-10 ENCOUNTER — Ambulatory Visit: Payer: BC Managed Care – PPO | Admitting: Nurse Practitioner

## 2022-07-11 ENCOUNTER — Encounter: Payer: Self-pay | Admitting: Nurse Practitioner

## 2022-07-11 ENCOUNTER — Ambulatory Visit: Payer: BC Managed Care – PPO | Admitting: Nurse Practitioner

## 2022-07-11 VITALS — BP 138/78 | HR 72 | Temp 96.7°F | Ht 66.0 in | Wt 225.4 lb

## 2022-07-11 DIAGNOSIS — Z23 Encounter for immunization: Secondary | ICD-10-CM | POA: Diagnosis not present

## 2022-07-11 DIAGNOSIS — E1169 Type 2 diabetes mellitus with other specified complication: Secondary | ICD-10-CM

## 2022-07-11 DIAGNOSIS — E782 Mixed hyperlipidemia: Secondary | ICD-10-CM

## 2022-07-11 DIAGNOSIS — I10 Essential (primary) hypertension: Secondary | ICD-10-CM | POA: Diagnosis not present

## 2022-07-11 LAB — MICROALBUMIN / CREATININE URINE RATIO
Creatinine,U: 119.6 mg/dL
Microalb Creat Ratio: 0.6 mg/g (ref 0.0–30.0)
Microalb, Ur: <0.7 mg/dL (ref 0.0–1.9)

## 2022-07-11 LAB — POCT GLYCOSYLATED HEMOGLOBIN (HGB A1C): Hemoglobin A1C: 5.9 % — AB (ref 4.0–5.6)

## 2022-07-11 NOTE — Patient Instructions (Signed)
Go to lab for urine collection Schedule fasting lab appt. Maintain current medications

## 2022-07-11 NOTE — Assessment & Plan Note (Signed)
BP: 120s-140s/70s-80s BP Readings from Last 3 Encounters:  07/11/22 138/78  05/01/22 116/80  01/10/22 128/72   under the care of cardiology

## 2022-07-11 NOTE — Assessment & Plan Note (Signed)
>>  ASSESSMENT AND PLAN FOR ATRIAL FIBRILLATION WITH RVR (HCC) WRITTEN ON 10/16/2022  3:19 PM BY NCHE, CHARLOTTE LUM, NP  >>ASSESSMENT AND PLAN FOR HYPERTENSIVE DISORDER WRITTEN ON 07/11/2022  4:07 PM BY NCHE, CHARLOTTE LUM, NP  BP: 120s-140s/70s-80s BP Readings from Last 3 Encounters:  07/11/22 138/78  05/01/22 116/80  01/10/22 128/72   under the care of cardiology

## 2022-07-11 NOTE — Assessment & Plan Note (Addendum)
Repeat lipid panel Continue crestor

## 2022-07-11 NOTE — Progress Notes (Signed)
Established Patient Visit  Patient: Stacy Chapman   DOB: 12-08-1955   66 y.o. Female  MRN: [id number] Visit Date: 07/11/2022  Subjective:    Chief Complaint  Patient presents with   Office Visit    DM/ HTN/ Hyperlipidemia F/u  Pt not fasting  Checks BP twice a week & tries to check BS daily  No concerns  Requesting records for eye exam  Flu shot given today    HPI DM (diabetes mellitus) ([place]) Repeat hgbA1c: 5.9% Controlled with diet Home glucose: 85-100 Normal foot exam Repeat urine microalbumin: normal Advised to schedule appt with opthalmology  Hyperlipidemia Repeat lipid panel Continue crestor  Hypertensive disorder BP: 120s-140s/70s-80s BP Readings from Last 3 Encounters:  07/11/22 138/78  05/01/22 116/80  01/10/22 128/72   under the care of cardiology  Reviewed medical, surgical, and social history today  Medications: Outpatient Medications Prior to Visit  Medication Sig   acetaminophen (TYLENOL) 500 MG tablet Take 1,000 mg by mouth as needed for moderate pain.   albuterol (VENTOLIN HFA) 108 (90 Base) MCG/ACT inhaler Need office visit for addiitonal refills   azelastine (ASTELIN) 0.1 % nasal spray USE 1 SPRAY IN EACH NOSTRIL TWICE DAILY   diltiazem (CARDIZEM CD) 120 MG 24 hr capsule TAKE 1 CAPSULE BY MOUTH DAILY   flecainide (TAMBOCOR) 50 MG tablet Take 1.5 tablets (75 mg total) by mouth 2 (two) times daily.   fluticasone (FLONASE) 50 MCG/ACT nasal spray SHAKE LIQUID AND USE 2 SPRAYS IN EACH NOSTRIL DAILY   furosemide (LASIX) 20 MG tablet TAKE 1 TABLET BY MOUTH DAILY AS NEEDED FOR SWELLING OR WEIGHT GAIN   gabapentin (NEURONTIN) 300 MG capsule Take 300 mg by mouth at bedtime.   montelukast (SINGULAIR) 10 MG tablet Take 1 tablet (10 mg total) by mouth at bedtime.   rosuvastatin (CRESTOR) 10 MG tablet Take 1 tablet (10 mg total) by mouth daily.   warfarin (COUMADIN) 5 MG tablet TAKE 2 TABLETS BY MOUTH DAILY AS DIRECTED BY COAGULATION CLINIC    No facility-administered medications prior to visit.   Reviewed past medical and social history.   ROS per HPI above      Objective:  BP 138/78 (BP Location: Left Arm, Patient Position: Sitting, Cuff Size: Normal)   Pulse 72   Temp (!) 96.7 F (35.9 C) (Temporal)   Ht '5\' 6"'$  (1.676 m)   Wt 225 lb 6.4 oz (102.2 kg)   SpO2 99%   BMI 36.38 kg/m      Physical Exam Constitutional:      Appearance: She is obese.  Cardiovascular:     Rate and Rhythm: Normal rate and regular rhythm.     Pulses: Normal pulses.     Heart sounds: Normal heart sounds.  Pulmonary:     Effort: Pulmonary effort is normal.     Breath sounds: Normal breath sounds.  Musculoskeletal:     Right lower leg: No edema.     Left lower leg: No edema.  Neurological:     Mental Status: She is alert and oriented to person, place, and time.     Results for orders placed or performed in visit on 07/11/22  Microalbumin / creatinine urine ratio  Result Value Ref Range   Microalb, Ur <0.7 0.0 - 1.9 mg/dL   Creatinine,U 119.6 mg/dL   Microalb Creat Ratio 0.6 0.0 - 30.0 mg/g  POCT glycosylated hemoglobin (Hb A1C)  Result  Value Ref Range   Hemoglobin A1C 5.9 (A) 4.0 - 5.6 %      Assessment & Plan:    Problem List Items Addressed This Visit       Cardiovascular and Mediastinum   Hypertensive disorder    BP: 120s-140s/70s-80s BP Readings from Last 3 Encounters:  07/11/22 138/78  05/01/22 116/80  01/10/22 128/72   under the care of cardiology        Endocrine   DM (diabetes mellitus) ([place]) - Primary    Repeat hgbA1c: 5.9% Controlled with diet Home glucose: 85-100 Normal foot exam Repeat urine microalbumin: normal Advised to schedule appt with opthalmology      Relevant Orders   Comprehensive metabolic panel   Microalbumin / creatinine urine ratio (Completed)   POCT glycosylated hemoglobin (Hb A1C) (Completed)     Other   Hyperlipidemia    Repeat lipid panel Continue crestor       Relevant Orders   Lipid panel   Other Visit Diagnoses     Need for immunization against influenza       Relevant Orders   Flu Vaccine QUAD High Dose(Fluad) (Completed)      Return in about 6 months (around 01/09/2023) for CPE (fasting).     Wilfred Lacy, NP

## 2022-07-11 NOTE — Assessment & Plan Note (Addendum)
Repeat hgbA1c: 5.9% Controlled with diet Home glucose: 85-100 Normal foot exam Repeat urine microalbumin: normal Advised to schedule appt with opthalmology

## 2022-07-11 NOTE — Assessment & Plan Note (Signed)
>>  ASSESSMENT AND PLAN FOR HYPERTENSIVE DISORDER WRITTEN ON 07/11/2022  4:07 PM BY Robyne Matar LUM, NP  BP: 120s-140s/70s-80s BP Readings from Last 3 Encounters:  07/11/22 138/78  05/01/22 116/80  01/10/22 128/72   under the care of cardiology

## 2022-07-14 ENCOUNTER — Other Ambulatory Visit (INDEPENDENT_AMBULATORY_CARE_PROVIDER_SITE_OTHER): Payer: BC Managed Care – PPO

## 2022-07-14 DIAGNOSIS — E1169 Type 2 diabetes mellitus with other specified complication: Secondary | ICD-10-CM

## 2022-07-14 DIAGNOSIS — E782 Mixed hyperlipidemia: Secondary | ICD-10-CM | POA: Diagnosis not present

## 2022-07-14 DIAGNOSIS — N289 Disorder of kidney and ureter, unspecified: Secondary | ICD-10-CM

## 2022-07-14 LAB — LIPID PANEL
Cholesterol: 153 mg/dL (ref 0–200)
HDL: 61.8 mg/dL (ref >39.00)
LDL Cholesterol: 77 mg/dL (ref 0–99)
NonHDL: 91.11
Total CHOL/HDL Ratio: 2
Triglycerides: 71 mg/dL (ref 0.0–149.0)
VLDL: 14.2 mg/dL (ref 0.0–40.0)

## 2022-07-14 LAB — COMPREHENSIVE METABOLIC PANEL
ALT: 17 U/L (ref 0–35)
AST: 22 U/L (ref 0–37)
Albumin: 3.9 g/dL (ref 3.5–5.2)
Alkaline Phosphatase: 46 U/L (ref 39–117)
BUN: 38 mg/dL — ABNORMAL HIGH (ref 6–23)
CO2: 23 mEq/L (ref 19–32)
Calcium: 9.7 mg/dL (ref 8.4–10.5)
Chloride: 105 mEq/L (ref 96–112)
Creatinine, Ser: 1.17 mg/dL (ref 0.40–1.20)
GFR: 48.86 mL/min — ABNORMAL LOW (ref >60.00)
Glucose, Bld: 98 mg/dL (ref 70–99)
Potassium: 4.1 mEq/L (ref 3.5–5.1)
Sodium: 137 mEq/L (ref 135–145)
Total Bilirubin: 0.4 mg/dL (ref 0.2–1.2)
Total Protein: 8.3 g/dL (ref 6.0–8.3)

## 2022-07-16 ENCOUNTER — Other Ambulatory Visit: Payer: Self-pay | Admitting: Nurse Practitioner

## 2022-07-16 DIAGNOSIS — J4521 Mild intermittent asthma with (acute) exacerbation: Secondary | ICD-10-CM

## 2022-07-16 NOTE — Telephone Encounter (Signed)
Chart supports Rx Last OV: 06/2022 Next OV: not scheduled  

## 2022-07-18 ENCOUNTER — Encounter: Payer: Self-pay | Admitting: Nurse Practitioner

## 2022-07-19 NOTE — Addendum Note (Signed)
Addended by: Wilfred Lacy L on: 07/19/2022 09:19 AM   Modules accepted: Orders

## 2022-07-31 ENCOUNTER — Ambulatory Visit: Payer: BC Managed Care – PPO | Attending: Cardiology

## 2022-07-31 DIAGNOSIS — I4891 Unspecified atrial fibrillation: Secondary | ICD-10-CM

## 2022-07-31 DIAGNOSIS — Z5181 Encounter for therapeutic drug level monitoring: Secondary | ICD-10-CM | POA: Diagnosis not present

## 2022-07-31 DIAGNOSIS — I480 Paroxysmal atrial fibrillation: Secondary | ICD-10-CM

## 2022-07-31 LAB — POCT INR: INR: 1.8 — AB (ref 2.0–3.0)

## 2022-07-31 NOTE — Patient Instructions (Signed)
Description   Take 2.5 tablets today and then continue taking 1 tablet daily except 2 tablets only on Sundays and Thursdays. Recheck INR in 4 weeks.   Be consistent with your leafy veggies and fruit smoothies (2-3 servings per week).  [facility] [phone] or [phone]

## 2022-08-11 ENCOUNTER — Other Ambulatory Visit: Payer: Self-pay | Admitting: Nurse Practitioner

## 2022-08-11 ENCOUNTER — Telehealth (HOSPITAL_COMMUNITY): Payer: Self-pay | Admitting: *Deleted

## 2022-08-11 DIAGNOSIS — I480 Paroxysmal atrial fibrillation: Secondary | ICD-10-CM

## 2022-08-11 DIAGNOSIS — J324 Chronic pansinusitis: Secondary | ICD-10-CM

## 2022-08-11 MED ORDER — AZELASTINE HCL 0.1 % NA SOLN: 1.0000 | Freq: Two times a day (BID) | NASAL | 0 refills | Status: DC

## 2022-08-11 MED ORDER — WARFARIN SODIUM 5 MG PO TABS: ORAL_TABLET | ORAL | 0 refills | Status: DC

## 2022-08-11 NOTE — Telephone Encounter (Signed)
Pt needs refill of coumadin since into [facility] on [address]

## 2022-08-11 NOTE — Telephone Encounter (Signed)
Prescription refill request received for warfarin Lov: 05/01/22 Stacy Chapman)  Next INR check: 08/28/22 Warfarin tablet strength: '5mg'$   Appropriate dose and refill sent to requested pharmacy.

## 2022-08-28 ENCOUNTER — Ambulatory Visit: Payer: BC Managed Care – PPO | Attending: Internal Medicine

## 2022-08-28 DIAGNOSIS — I480 Paroxysmal atrial fibrillation: Secondary | ICD-10-CM

## 2022-08-28 DIAGNOSIS — I4891 Unspecified atrial fibrillation: Secondary | ICD-10-CM | POA: Diagnosis not present

## 2022-08-28 DIAGNOSIS — Z5181 Encounter for therapeutic drug level monitoring: Secondary | ICD-10-CM | POA: Diagnosis not present

## 2022-08-28 LAB — POCT INR: INR: 1.7 — AB (ref 2.0–3.0)

## 2022-08-28 NOTE — Patient Instructions (Signed)
Description   Take 2.5 tablets today and 1.5 tablets tomorrow and then continue taking 1 tablet daily except 2 tablets only on Sundays and Thursdays. Recheck INR in 3 weeks.   Be consistent with your leafy veggies and fruit smoothies (2-3 servings per week).  [facility] Clinic [phone]

## 2022-09-15 ENCOUNTER — Other Ambulatory Visit: Payer: Self-pay

## 2022-09-15 MED ORDER — FLECAINIDE ACETATE 50 MG PO TABS: 75.0000 mg | ORAL_TABLET | Freq: Two times a day (BID) | ORAL | 2 refills | Status: DC

## 2022-09-18 ENCOUNTER — Ambulatory Visit: Payer: BC Managed Care – PPO | Attending: Internal Medicine | Admitting: *Deleted

## 2022-09-18 DIAGNOSIS — Z5181 Encounter for therapeutic drug level monitoring: Secondary | ICD-10-CM

## 2022-09-18 DIAGNOSIS — I480 Paroxysmal atrial fibrillation: Secondary | ICD-10-CM

## 2022-09-18 DIAGNOSIS — I4891 Unspecified atrial fibrillation: Secondary | ICD-10-CM

## 2022-09-18 LAB — POCT INR: INR: 3 (ref 2.0–3.0)

## 2022-09-18 NOTE — Patient Instructions (Signed)
Description   Continue taking 1 tablet daily except 2 tablets only on Sundays and Thursdays. Recheck INR in 3 weeks.  Be consistent with your leafy veggies and fruit smoothies (2-3 servings per week).  [facility] [phone]

## 2022-10-01 ENCOUNTER — Other Ambulatory Visit (HOSPITAL_COMMUNITY): Payer: Self-pay | Admitting: Internal Medicine

## 2022-10-01 DIAGNOSIS — I480 Paroxysmal atrial fibrillation: Secondary | ICD-10-CM

## 2022-10-01 MED ORDER — WARFARIN SODIUM 5 MG PO TABS: ORAL_TABLET | ORAL | 0 refills | Status: DC

## 2022-10-01 NOTE — Telephone Encounter (Signed)
Last INR 09/18/2022 Last OV 05/01/2022

## 2022-10-08 ENCOUNTER — Other Ambulatory Visit (HOSPITAL_COMMUNITY): Payer: Self-pay | Admitting: *Deleted

## 2022-10-08 MED ORDER — DILTIAZEM HCL ER COATED BEADS 120 MG PO CP24: ORAL_CAPSULE | ORAL | 2 refills | Status: DC

## 2022-10-09 ENCOUNTER — Ambulatory Visit: Payer: BC Managed Care – PPO

## 2022-10-09 ENCOUNTER — Observation Stay (HOSPITAL_COMMUNITY)
Admission: EM | Admit: 2022-10-09 | Discharge: 2022-10-11 | Disposition: A | Discharge status: Home / Self Care | Payer: BC Managed Care – PPO | Attending: Family Medicine | Admitting: Family Medicine

## 2022-10-09 ENCOUNTER — Other Ambulatory Visit: Payer: Self-pay

## 2022-10-09 ENCOUNTER — Emergency Department (HOSPITAL_COMMUNITY): Payer: BC Managed Care – PPO

## 2022-10-09 ENCOUNTER — Encounter (HOSPITAL_COMMUNITY): Payer: Self-pay

## 2022-10-09 ENCOUNTER — Observation Stay (HOSPITAL_BASED_OUTPATIENT_CLINIC_OR_DEPARTMENT_OTHER): Payer: BC Managed Care – PPO

## 2022-10-09 DIAGNOSIS — Z8673 Personal history of transient ischemic attack (TIA), and cerebral infarction without residual deficits: Secondary | ICD-10-CM | POA: Insufficient documentation

## 2022-10-09 DIAGNOSIS — E782 Mixed hyperlipidemia: Secondary | ICD-10-CM

## 2022-10-09 DIAGNOSIS — I4891 Unspecified atrial fibrillation: Secondary | ICD-10-CM

## 2022-10-09 DIAGNOSIS — E119 Type 2 diabetes mellitus without complications: Secondary | ICD-10-CM | POA: Diagnosis not present

## 2022-10-09 DIAGNOSIS — J3489 Other specified disorders of nose and nasal sinuses: Secondary | ICD-10-CM | POA: Diagnosis not present

## 2022-10-09 DIAGNOSIS — S0990XA Unspecified injury of head, initial encounter: Secondary | ICD-10-CM | POA: Insufficient documentation

## 2022-10-09 DIAGNOSIS — I6782 Cerebral ischemia: Secondary | ICD-10-CM | POA: Diagnosis not present

## 2022-10-09 DIAGNOSIS — J984 Other disorders of lung: Secondary | ICD-10-CM | POA: Diagnosis not present

## 2022-10-09 DIAGNOSIS — J101 Influenza due to other identified influenza virus with other respiratory manifestations: Secondary | ICD-10-CM | POA: Insufficient documentation

## 2022-10-09 DIAGNOSIS — S199XXA Unspecified injury of neck, initial encounter: Secondary | ICD-10-CM | POA: Diagnosis not present

## 2022-10-09 DIAGNOSIS — E785 Hyperlipidemia, unspecified: Secondary | ICD-10-CM | POA: Diagnosis present

## 2022-10-09 DIAGNOSIS — R918 Other nonspecific abnormal finding of lung field: Secondary | ICD-10-CM

## 2022-10-09 DIAGNOSIS — R55 Syncope and collapse: Secondary | ICD-10-CM | POA: Diagnosis not present

## 2022-10-09 DIAGNOSIS — I4819 Other persistent atrial fibrillation: Secondary | ICD-10-CM | POA: Diagnosis not present

## 2022-10-09 DIAGNOSIS — R197 Diarrhea, unspecified: Secondary | ICD-10-CM

## 2022-10-09 DIAGNOSIS — I10 Essential (primary) hypertension: Secondary | ICD-10-CM | POA: Diagnosis not present

## 2022-10-09 DIAGNOSIS — I5020 Unspecified systolic (congestive) heart failure: Secondary | ICD-10-CM | POA: Diagnosis not present

## 2022-10-09 DIAGNOSIS — Z79899 Other long term (current) drug therapy: Secondary | ICD-10-CM | POA: Insufficient documentation

## 2022-10-09 DIAGNOSIS — J45909 Unspecified asthma, uncomplicated: Secondary | ICD-10-CM | POA: Insufficient documentation

## 2022-10-09 DIAGNOSIS — Z7901 Long term (current) use of anticoagulants: Secondary | ICD-10-CM | POA: Diagnosis not present

## 2022-10-09 DIAGNOSIS — I499 Cardiac arrhythmia, unspecified: Secondary | ICD-10-CM | POA: Diagnosis not present

## 2022-10-09 DIAGNOSIS — X58XXXA Exposure to other specified factors, initial encounter: Secondary | ICD-10-CM | POA: Diagnosis not present

## 2022-10-09 DIAGNOSIS — Z1152 Encounter for screening for COVID-19: Secondary | ICD-10-CM | POA: Insufficient documentation

## 2022-10-09 DIAGNOSIS — E1169 Type 2 diabetes mellitus with other specified complication: Secondary | ICD-10-CM | POA: Diagnosis not present

## 2022-10-09 DIAGNOSIS — R531 Weakness: Secondary | ICD-10-CM | POA: Diagnosis not present

## 2022-10-09 DIAGNOSIS — Z043 Encounter for examination and observation following other accident: Secondary | ICD-10-CM | POA: Diagnosis not present

## 2022-10-09 DIAGNOSIS — I6523 Occlusion and stenosis of bilateral carotid arteries: Secondary | ICD-10-CM | POA: Diagnosis not present

## 2022-10-09 HISTORY — DX: Diarrhea, unspecified: R19.7

## 2022-10-09 LAB — I-STAT CHEM 8, ED
BUN: 23 mg/dL (ref 8–23)
Calcium, Ion: 1.05 mmol/L — ABNORMAL LOW (ref 1.15–1.40)
Chloride: 106 mmol/L (ref 98–111)
Creatinine, Ser: 0.9 mg/dL (ref 0.44–1.00)
Glucose, Bld: 90 mg/dL (ref 70–99)
HCT: 38 % (ref 36.0–46.0)
Hemoglobin: 12.9 g/dL (ref 12.0–15.0)
Potassium: 4.7 mmol/L (ref 3.5–5.1)
Sodium: 135 mmol/L (ref 135–145)
TCO2: 22 mmol/L (ref 22–32)

## 2022-10-09 LAB — CBC
HCT: 37.3 % (ref 36.0–46.0)
Hemoglobin: 11.6 g/dL — ABNORMAL LOW (ref 12.0–15.0)
MCH: 28 pg (ref 26.0–34.0)
MCHC: 31.1 g/dL (ref 30.0–36.0)
MCV: 90.1 fL (ref 80.0–100.0)
Platelets: 200 10*3/uL (ref 150–400)
RBC: 4.14 MIL/uL (ref 3.87–5.11)
RDW: 14.3 % (ref 11.5–15.5)
WBC: 5 10*3/uL (ref 4.0–10.5)
nRBC: 0 % (ref 0.0–0.2)

## 2022-10-09 LAB — RESP PANEL BY RT-PCR (RSV, FLU A&B, COVID)  RVPGX2
Influenza A by PCR: POSITIVE — AB
Influenza B by PCR: NEGATIVE
Resp Syncytial Virus by PCR: NEGATIVE
SARS Coronavirus 2 by RT PCR: NEGATIVE

## 2022-10-09 LAB — COMPREHENSIVE METABOLIC PANEL
ALT: 18 U/L (ref 0–44)
AST: 27 U/L (ref 15–41)
Albumin: 3.1 g/dL — ABNORMAL LOW (ref 3.5–5.0)
Alkaline Phosphatase: 37 U/L — ABNORMAL LOW (ref 38–126)
Anion gap: 4 — ABNORMAL LOW (ref 5–15)
BUN: 18 mg/dL (ref 8–23)
CO2: 23 mmol/L (ref 22–32)
Calcium: 8.8 mg/dL — ABNORMAL LOW (ref 8.9–10.3)
Chloride: 106 mmol/L (ref 98–111)
Creatinine, Ser: 1.04 mg/dL — ABNORMAL HIGH (ref 0.44–1.00)
GFR, Estimated: 59 mL/min — ABNORMAL LOW (ref >60)
Glucose, Bld: 89 mg/dL (ref 70–99)
Potassium: 4.6 mmol/L (ref 3.5–5.1)
Sodium: 133 mmol/L — ABNORMAL LOW (ref 135–145)
Total Bilirubin: 0.8 mg/dL (ref 0.3–1.2)
Total Protein: 7.3 g/dL (ref 6.5–8.1)

## 2022-10-09 LAB — ECHOCARDIOGRAM COMPLETE
AR max vel: 1.94 cm2
AV Area VTI: 2.15 cm2
AV Area mean vel: 1.91 cm2
AV Mean grad: 5 mmHg
AV Peak grad: 8.3 mmHg
Ao pk vel: 1.44 m/s
Area-P 1/2: 4.29 cm2
Calc EF: 44.1 %
Height: 67 in
MV M vel: 4.6 m/s
MV Peak grad: 84.6 mmHg
P 1/2 time: 805 msec
S' Lateral: 3.5 cm
Single Plane A2C EF: 47.2 %
Single Plane A4C EF: 44.2 %
Weight: 3520 oz

## 2022-10-09 LAB — URINALYSIS, ROUTINE W REFLEX MICROSCOPIC
Bacteria, UA: NONE SEEN
Bilirubin Urine: NEGATIVE
Glucose, UA: NEGATIVE mg/dL
Ketones, ur: NEGATIVE mg/dL
Leukocytes,Ua: NEGATIVE
Nitrite: NEGATIVE
Protein, ur: NEGATIVE mg/dL
Specific Gravity, Urine: 1.01 (ref 1.005–1.030)
pH: 6 (ref 5.0–8.0)

## 2022-10-09 LAB — SAMPLE TO BLOOD BANK

## 2022-10-09 LAB — BRAIN NATRIURETIC PEPTIDE: B Natriuretic Peptide: 512.3 pg/mL — ABNORMAL HIGH (ref 0.0–100.0)

## 2022-10-09 LAB — HIV ANTIBODY (ROUTINE TESTING W REFLEX): HIV Screen 4th Generation wRfx: NONREACTIVE

## 2022-10-09 LAB — PROTIME-INR
INR: 1.8 — ABNORMAL HIGH (ref 0.8–1.2)
Prothrombin Time: 20.8 seconds — ABNORMAL HIGH (ref 11.4–15.2)

## 2022-10-09 LAB — PROCALCITONIN: Procalcitonin: <0.1 ng/mL

## 2022-10-09 LAB — ETHANOL: Alcohol, Ethyl (B): <10 mg/dL (ref <10)

## 2022-10-09 LAB — HEPARIN LEVEL (UNFRACTIONATED): Heparin Unfractionated: 0.58 IU/mL (ref 0.30–0.70)

## 2022-10-09 MED ORDER — IBUPROFEN 200 MG PO TABS
200.0000 mg | ORAL_TABLET | ORAL | Status: AC
  Administered 2022-10-09: 200 mg via ORAL
  Filled 2022-10-09: qty 1

## 2022-10-09 MED ORDER — FLECAINIDE ACETATE 50 MG PO TABS
75.0000 mg | ORAL_TABLET | Freq: Two times a day (BID) | ORAL | Status: DC
  Administered 2022-10-09 – 2022-10-11 (×5): 75 mg via ORAL
  Filled 2022-10-09 (×5): qty 2

## 2022-10-09 MED ORDER — SODIUM CHLORIDE 0.9 % IV SOLN: 1000.0000 mL | INTRAVENOUS | Status: DC

## 2022-10-09 MED ORDER — DILTIAZEM HCL ER COATED BEADS 120 MG PO CP24
120.0000 mg | ORAL_CAPSULE | Freq: Every day | ORAL | Status: DC
  Administered 2022-10-09 – 2022-10-11 (×3): 120 mg via ORAL
  Filled 2022-10-09 (×4): qty 1

## 2022-10-09 MED ORDER — WARFARIN - PHARMACIST DOSING INPATIENT: Freq: Every day | Status: DC

## 2022-10-09 MED ORDER — GUAIFENESIN 100 MG/5ML PO LIQD
5.0000 mL | ORAL | Status: DC | PRN
  Administered 2022-10-10: 5 mL via ORAL
  Filled 2022-10-09: qty 10

## 2022-10-09 MED ORDER — HEPARIN (PORCINE) 25000 UT/250ML-% IV SOLN
1200.0000 [IU]/h | INTRAVENOUS | Status: DC
  Administered 2022-10-09 – 2022-10-10 (×2): 1200 [IU]/h via INTRAVENOUS
  Filled 2022-10-09 (×2): qty 250

## 2022-10-09 MED ORDER — OSELTAMIVIR PHOSPHATE 75 MG PO CAPS
75.0000 mg | ORAL_CAPSULE | Freq: Two times a day (BID) | ORAL | Status: DC
  Administered 2022-10-09 – 2022-10-10 (×3): 75 mg via ORAL
  Filled 2022-10-09 (×3): qty 1

## 2022-10-09 MED ORDER — CALCIUM GLUCONATE-NACL 2-0.675 GM/100ML-% IV SOLN
2.0000 g | Freq: Once | INTRAVENOUS | Status: AC
  Administered 2022-10-09: 2000 mg via INTRAVENOUS
  Filled 2022-10-09: qty 100

## 2022-10-09 MED ORDER — IPRATROPIUM-ALBUTEROL 0.5-2.5 (3) MG/3ML IN SOLN
3.0000 mL | Freq: Two times a day (BID) | RESPIRATORY_TRACT | Status: DC
  Administered 2022-10-09 – 2022-10-10 (×3): 3 mL via RESPIRATORY_TRACT
  Filled 2022-10-09 (×3): qty 3

## 2022-10-09 MED ORDER — IOHEXOL 350 MG/ML SOLN
75.0000 mL | Freq: Once | INTRAVENOUS | Status: AC | PRN
  Administered 2022-10-09: 75 mL via INTRAVENOUS

## 2022-10-09 MED ORDER — IPRATROPIUM-ALBUTEROL 0.5-2.5 (3) MG/3ML IN SOLN
3.0000 mL | RESPIRATORY_TRACT | Status: DC | PRN
  Administered 2022-10-10: 3 mL via RESPIRATORY_TRACT
  Filled 2022-10-09: qty 3

## 2022-10-09 MED ORDER — DILTIAZEM HCL-DEXTROSE 125-5 MG/125ML-% IV SOLN (PREMIX)
5.0000 mg/h | INTRAVENOUS | Status: DC
  Administered 2022-10-09: 5 mg/h via INTRAVENOUS
  Filled 2022-10-09: qty 125

## 2022-10-09 MED ORDER — SODIUM CHLORIDE 0.9 % IV BOLUS (SEPSIS)
1000.0000 mL | Freq: Once | INTRAVENOUS | Status: AC
  Administered 2022-10-09: 1000 mL via INTRAVENOUS

## 2022-10-09 MED ORDER — PERFLUTREN LIPID MICROSPHERE
1.0000 mL | INTRAVENOUS | Status: AC | PRN
  Administered 2022-10-09: 2 mL via INTRAVENOUS

## 2022-10-09 MED ORDER — METOPROLOL TARTRATE 5 MG/5ML IV SOLN: 5.0000 mg | INTRAVENOUS | Status: DC | PRN

## 2022-10-09 MED ORDER — TRAZODONE HCL 50 MG PO TABS
50.0000 mg | ORAL_TABLET | Freq: Every evening | ORAL | Status: DC | PRN
  Administered 2022-10-09 – 2022-10-10 (×2): 50 mg via ORAL
  Filled 2022-10-09 (×2): qty 1

## 2022-10-09 MED ORDER — HYDRALAZINE HCL 20 MG/ML IJ SOLN: 10.0000 mg | INTRAMUSCULAR | Status: DC | PRN

## 2022-10-09 MED ORDER — WARFARIN SODIUM 10 MG PO TABS
10.0000 mg | ORAL_TABLET | Freq: Once | ORAL | Status: AC
  Administered 2022-10-09: 10 mg via ORAL
  Filled 2022-10-09: qty 1

## 2022-10-09 MED ORDER — SENNOSIDES-DOCUSATE SODIUM 8.6-50 MG PO TABS: 1.0000 | ORAL_TABLET | Freq: Every evening | ORAL | Status: DC | PRN

## 2022-10-09 MED ORDER — ONDANSETRON HCL 4 MG/2ML IJ SOLN
4.0000 mg | Freq: Four times a day (QID) | INTRAMUSCULAR | Status: DC | PRN
  Administered 2022-10-09: 4 mg via INTRAVENOUS
  Filled 2022-10-09: qty 2

## 2022-10-09 MED ORDER — ACETAMINOPHEN 325 MG PO TABS
650.0000 mg | ORAL_TABLET | ORAL | Status: DC | PRN
  Administered 2022-10-09 (×2): 650 mg via ORAL
  Filled 2022-10-09 (×3): qty 2

## 2022-10-09 MED ORDER — SODIUM CHLORIDE 0.9 % IV SOLN
1000.0000 mL | INTRAVENOUS | Status: DC
  Administered 2022-10-09: 1000 mL via INTRAVENOUS

## 2022-10-09 MED ORDER — OXYCODONE HCL 5 MG PO TABS: 5.0000 mg | ORAL_TABLET | ORAL | Status: DC | PRN

## 2022-10-09 NOTE — ED Notes (Signed)
Notified MD that patient has had a period of asystole of around 20 sec followed by couplets then back into afib. This happened at 0947 and 1002.

## 2022-10-09 NOTE — Progress Notes (Signed)
Chaplain prayed with patient when she arrived in ED.  She requested prayer for her little grandchildren who were staying with her for the holiday.  "I think I scared them!"  Chaplain understands family is on the way to [facility]. Call if chaplain is needed for more support. Rev. Tamsen Snider Pager [phone]

## 2022-10-09 NOTE — Progress Notes (Signed)
PROGRESS NOTE    Stacy Chapman  TKW:[id number] DOB: 1956/08/04 DOA: 10/09/2022 PCP: [name], NP   Brief Narrative:  66 year old with history of A-fib, prior TIA on Coumadin admitted to the hospital for syncopal episode and severe diarrhea.  Patient was found to be in atrial fibrillation with RVR, CT of the chest abdomen pelvis showed possible early interstitial lung disease otherwise no other acute intra-abdominal pathology.  Lab work was overall unremarkable.  Patient was started on Cardizem drip along with home p.o. medication.  Patient's flu swab came back positive therefore started on Tamiflu   Assessment & Plan:  Principal Problem:   Atrial fibrillation with RVR ([place]) Active Problems:   Diarrhea of presumed infectious origin   DM (diabetes mellitus) ([place])   Hyperlipidemia   Abnormal CT scan of lung     Assessment and Plan: * Atrial fibrillation with RVR (HCC) -Rate appears to be better controlled, slowly wean off Cardizem drip.  Continue home p.o. Cardizem and Tambocor.  Continue Coumadin.  Pharmacy to manage INR, goal 2.0-3.0.  Due to difficult to control rhythm, cardiology consulted  Diarrhea of presumed infectious origin Likely viral in nature.  Influenza positive.  GI panel ordered  Influenza A infection - On Tamiflu.  Supportive care  Hypocalcemia - Repletion  Abnormal CT scan of lung -Has some interstitial lung disease.  This is seen on the CAT scan.  Will check procalcitonin, BNP.  Supportive care, bronchodilator.  Will rec and follow-up outpatient pulmonary  Hyperlipidemia Statin  DM type II (diabetes mellitus) ([place]) Diet controlled          DVT prophylaxis: On Coumadin Code Status: Full code Family Communication: Daughter at bedside Continue hospital stay due to intermittent atrial fibrillation with RVR with pauses.  Subjective:  Patient seen and examined at bedside.  At rest she feels okay but off-and-on she is getting  lightheaded which has been going on at home as well On telemetry patient does have few pauses  Examination:  General exam: Appears calm and comfortable  Respiratory system: Clear to auscultation. Respiratory effort normal. Cardiovascular system: Tachycardia, irregularly irregular rhythm Gastrointestinal system: Abdomen is nondistended, soft and nontender. No organomegaly or masses felt. Normal bowel sounds heard. Central nervous system: Alert and oriented. No focal neurological deficits. Extremities: Symmetric 5 x 5 power. Skin: No rashes, lesions or ulcers Psychiatry: Judgement and insight appear normal. Mood & affect appropriate.     Objective: Vitals:   10/09/22 0335 10/09/22 0400 10/09/22 0430 10/09/22 0645  BP: (!) 129/91 (!) 156/106 (!) 144/109 (!) 140/75  Pulse: (!) 125 (!) 102 (!) 124 90  Resp: 20 (!) 22 20 (!) 21  Temp: 99.6 F (37.6 C)     TempSrc: Oral     SpO2: 97% 96% 98% 97%  Weight:      Height:       No intake or output data in the 24 hours ending 10/09/22 0731 Filed Weights   10/09/22 0049  Weight: 99.8 kg     Data Reviewed:   CBC: Recent Labs  Lab 10/09/22 0055 10/09/22 0350  WBC  --  5.0  HGB 12.9 11.6*  HCT 38.0 37.3  MCV  --  90.1  PLT  --  924   Basic Metabolic Panel: Recent Labs  Lab 10/09/22 0040 10/09/22 0055  NA 133* 135  K 4.6 4.7  CL 106 106  CO2 23  --   GLUCOSE 89 90  BUN 18 23  CREATININE 1.04* 0.90  CALCIUM 8.8*  --    GFR: Estimated Creatinine Clearance: 74.6 mL/min (by C-G formula based on SCr of 0.9 mg/dL). Liver Function Tests: Recent Labs  Lab 10/09/22 0040  AST 27  ALT 18  ALKPHOS 37*  BILITOT 0.8  PROT 7.3  ALBUMIN 3.1*   No results for input(s): "LIPASE", "AMYLASE" in the last 168 hours. No results for input(s): "AMMONIA" in the last 168 hours. Coagulation Profile: Recent Labs  Lab 10/09/22 0350  INR 1.8*   Cardiac Enzymes: No results for input(s): "CKTOTAL", "CKMB", "CKMBINDEX", "TROPONINI"  in the last 168 hours. BNP (last 3 results) No results for input(s): "PROBNP" in the last 8760 hours. HbA1C: No results for input(s): "HGBA1C" in the last 72 hours. CBG: No results for input(s): "GLUCAP" in the last 168 hours. Lipid Profile: No results for input(s): "CHOL", "HDL", "LDLCALC", "TRIG", "CHOLHDL", "LDLDIRECT" in the last 72 hours. Thyroid Function Tests: No results for input(s): "TSH", "T4TOTAL", "FREET4", "T3FREE", "THYROIDAB" in the last 72 hours. Anemia Panel: No results for input(s): "VITAMINB12", "FOLATE", "FERRITIN", "TIBC", "IRON", "RETICCTPCT" in the last 72 hours. Sepsis Labs: No results for input(s): "PROCALCITON", "LATICACIDVEN" in the last 168 hours.  Recent Results (from the past 240 hour(s))  Resp panel by RT-PCR (RSV, Flu A&B, Covid) Anterior Nasal Swab     Status: Abnormal   Collection Time: 10/09/22  4:14 AM   Specimen: Anterior Nasal Swab  Result Value Ref Range Status   SARS Coronavirus 2 by RT PCR NEGATIVE NEGATIVE Final    Comment: (NOTE) SARS-CoV-2 target nucleic acids are NOT DETECTED.  The SARS-CoV-2 RNA is generally detectable in upper respiratory specimens during the acute phase of infection. The lowest concentration of SARS-CoV-2 viral copies this assay can detect is 138 copies/mL. A negative result does not preclude SARS-Cov-2 infection and should not be used as the sole basis for treatment or other patient management decisions. A negative result may occur with  improper specimen collection/handling, submission of specimen other than nasopharyngeal swab, presence of viral mutation(s) within the areas targeted by this assay, and inadequate number of viral copies(<138 copies/mL). A negative result must be combined with clinical observations, patient history, and epidemiological information. The expected result is Negative.  Fact Sheet for Patients:  EntrepreneurPulse.com.au  Fact Sheet for Healthcare Providers:   IncredibleEmployment.be  This test is no t yet approved or cleared by the [place] FDA and  has been authorized for detection and/or diagnosis of SARS-CoV-2 by FDA under an Emergency Use Authorization (EUA). This EUA will remain  in effect (meaning this test can be used) for the duration of the COVID-19 declaration under Section 564(b)(1) of the Act, 21 U.S.C.section 360bbb-3(b)(1), unless the authorization is terminated  or revoked sooner.       Influenza A by PCR POSITIVE (A) NEGATIVE Final   Influenza B by PCR NEGATIVE NEGATIVE Final    Comment: (NOTE) The Xpert Xpress SARS-CoV-2/FLU/RSV plus assay is intended as an aid in the diagnosis of influenza from Nasopharyngeal swab specimens and should not be used as a sole basis for treatment. Nasal washings and aspirates are unacceptable for Xpert Xpress SARS-CoV-2/FLU/RSV testing.  Fact Sheet for Patients: EntrepreneurPulse.com.au  Fact Sheet for Healthcare Providers: IncredibleEmployment.be  This test is not yet approved or cleared by the [place] FDA and has been authorized for detection and/or diagnosis of SARS-CoV-2 by FDA under an Emergency Use Authorization (EUA). This EUA will remain in effect (meaning this test can be used) for the duration of the COVID-19  declaration under Section 564(b)(1) of the Act, 21 U.S.C. section 360bbb-3(b)(1), unless the authorization is terminated or revoked.     Resp Syncytial Virus by PCR NEGATIVE NEGATIVE Final    Comment: (NOTE) Fact Sheet for Patients: EntrepreneurPulse.com.au  Fact Sheet for Healthcare Providers: IncredibleEmployment.be  This test is not yet approved or cleared by the [place] FDA and has been authorized for detection and/or diagnosis of SARS-CoV-2 by FDA under an Emergency Use Authorization (EUA). This EUA will remain in effect (meaning this test can be used)  for the duration of the COVID-19 declaration under Section 564(b)(1) of the Act, 21 U.S.C. section 360bbb-3(b)(1), unless the authorization is terminated or revoked.  Performed at [facility], [place] [address] [place], [place] [address]          Radiology Studies: CT CHEST ABDOMEN PELVIS W CONTRAST  Result Date: 10/09/2022 CLINICAL DATA:  Weakness.  Lung mass EXAM: CT CHEST, ABDOMEN, AND PELVIS WITH CONTRAST TECHNIQUE: Multidetector CT imaging of the chest, abdomen and pelvis was performed following the standard protocol during bolus administration of intravenous contrast. RADIATION DOSE REDUCTION: This exam was performed according to the departmental dose-optimization program which includes automated exposure control, adjustment of the mA and/or kV according to patient size and/or use of iterative reconstruction technique. CONTRAST:  80m OMNIPAQUE IOHEXOL 350 MG/ML SOLN COMPARISON:  Radiograph 10/09/2022 FINDINGS: CT CHEST FINDINGS Cardiovascular: Normal heart size. No pericardial effusion. The main pulmonary artery is enlarged measuring 3.5 cm. No central pulmonary embolus. Mediastinum/Nodes: Calcified hilar and mediastinal nodes. The mediastinal nodes are enlarged. For example 1.9 cm subcarinal node. Unremarkable esophagus. Lungs/Pleura: Scarring in the lower lungs. Subpleural reticular opacities and bronchiolectasis with question of early honeycombing. There are a few scattered centrilobular ground-glass micro nodules compatible with small airway infection/inflammation. No pleural effusion or pneumothorax. Musculoskeletal: No acute abnormality. CT ABDOMEN PELVIS FINDINGS Hepatobiliary: No focal hepatic lesion. Unremarkable gallbladder. No biliary dilation. Pancreas: Unremarkable. No pancreatic ductal dilatation or surrounding inflammatory changes. Spleen: Normal in size without focal abnormality. Adrenals/Urinary Tract: Unremarkable adrenal glands. Low-attenuation lesions in the kidneys  are statistically likely to represent cysts. No follow-up is required. No urinary calculi or hydronephrosis. Unremarkable bladder. Stomach/Bowel: Normal caliber large and small bowel. No bowel wall thickening. Appendix is unremarkable. Stomach is decompressed. Vascular/Lymphatic: Mild aorto bi-iliac atherosclerotic calcification. No suspicious lymphadenopathy. Reproductive: Uterus and bilateral adnexa are unremarkable. Other: No free intraperitoneal fluid or air. Musculoskeletal: No acute fracture. IMPRESSION: 1. Calcified hilar and mediastinal nodes which are enlarged. This is indeterminate and may be due to prior granulomatous infection/inflammation or treated lymphoma. 2. Question early interstitial lung disease given subpleural reticular opacities, blunt bronchiolectasis and possible early honeycombing in the lower lungs. 3. Mild small airway infection/inflammation. 4. No acute abnormality in the abdomen or pelvis. Electronically Signed   By: TPlacido SouM.D.   On: 10/09/2022 04:04   CT HEAD WO CONTRAST  Result Date: 10/09/2022 CLINICAL DATA:  Trauma. EXAM: CT HEAD WITHOUT CONTRAST CT CERVICAL SPINE WITHOUT CONTRAST TECHNIQUE: Multidetector CT imaging of the head and cervical spine was performed following the standard protocol without intravenous contrast. Multiplanar CT image reconstructions of the cervical spine were also generated. RADIATION DOSE REDUCTION: This exam was performed according to the departmental dose-optimization program which includes automated exposure control, adjustment of the mA and/or kV according to patient size and/or use of iterative reconstruction technique. COMPARISON:  None Available. FINDINGS: CT HEAD FINDINGS Brain: The ventricles and sulci appropriate size for patient's age. Mild periventricular and  deep white matter chronic microvascular ischemic changes noted. There is no acute intracranial hemorrhage. No mass effect or midline shift. No extra-axial fluid collection.  Vascular: None Skull: Normal. Negative for fracture or focal lesion. Sinuses/Orbits: Diffuse mucoperiosteal thickening of paranasal sinuses. No air-fluid level. The mastoid air cells are clear. Other: None CT CERVICAL SPINE FINDINGS Alignment: No acute subluxation. Skull base and vertebrae: No acute fracture. Soft tissues and spinal canal: No prevertebral fluid or swelling. No visible canal hematoma. Disc levels:  No acute findings.  Degenerative changes. Upper chest: A 6 mm right apical nodule or a vascular confluence (93/4 and 17/9). Dedicated chest CT recommended for better evaluation of the lungs on a nonemergent/outpatient basis. Other: Bilateral carotid bulb calcified plaques. There is a 3.0 x 2.5 cm soft tissue density in the medial apical pleural surface. Although this may represent vascular confluence a mass is not excluded. IMPRESSION: 1. No acute intracranial pathology. 2. Mild chronic microvascular ischemic changes. 3. No acute/traumatic cervical spine pathology. 4. A 6 mm right apical nodule or a vascular confluence. Additionally there is a right apical soft tissue mass versus confluence of vessels. Dedicated chest CT with IV contrast is recommended for better evaluation of the lungs. This can be performed on a nonemergent/outpatient basis. Electronically Signed   By: Anner Crete M.D.   On: 10/09/2022 02:13   CT CERVICAL SPINE WO CONTRAST  Result Date: 10/09/2022 CLINICAL DATA:  Trauma. EXAM: CT HEAD WITHOUT CONTRAST CT CERVICAL SPINE WITHOUT CONTRAST TECHNIQUE: Multidetector CT imaging of the head and cervical spine was performed following the standard protocol without intravenous contrast. Multiplanar CT image reconstructions of the cervical spine were also generated. RADIATION DOSE REDUCTION: This exam was performed according to the departmental dose-optimization program which includes automated exposure control, adjustment of the mA and/or kV according to patient size and/or use of  iterative reconstruction technique. COMPARISON:  None Available. FINDINGS: CT HEAD FINDINGS Brain: The ventricles and sulci appropriate size for patient's age. Mild periventricular and deep white matter chronic microvascular ischemic changes noted. There is no acute intracranial hemorrhage. No mass effect or midline shift. No extra-axial fluid collection. Vascular: None Skull: Normal. Negative for fracture or focal lesion. Sinuses/Orbits: Diffuse mucoperiosteal thickening of paranasal sinuses. No air-fluid level. The mastoid air cells are clear. Other: None CT CERVICAL SPINE FINDINGS Alignment: No acute subluxation. Skull base and vertebrae: No acute fracture. Soft tissues and spinal canal: No prevertebral fluid or swelling. No visible canal hematoma. Disc levels:  No acute findings.  Degenerative changes. Upper chest: A 6 mm right apical nodule or a vascular confluence (93/4 and 17/9). Dedicated chest CT recommended for better evaluation of the lungs on a nonemergent/outpatient basis. Other: Bilateral carotid bulb calcified plaques. There is a 3.0 x 2.5 cm soft tissue density in the medial apical pleural surface. Although this may represent vascular confluence a mass is not excluded. IMPRESSION: 1. No acute intracranial pathology. 2. Mild chronic microvascular ischemic changes. 3. No acute/traumatic cervical spine pathology. 4. A 6 mm right apical nodule or a vascular confluence. Additionally there is a right apical soft tissue mass versus confluence of vessels. Dedicated chest CT with IV contrast is recommended for better evaluation of the lungs. This can be performed on a nonemergent/outpatient basis. Electronically Signed   By: Anner Crete M.D.   On: 10/09/2022 02:13   DG Chest Port 1 View  Result Date: 10/09/2022 CLINICAL DATA:  Level 2 fall on blood thinners EXAM: PORTABLE CHEST 1 VIEW COMPARISON:  08/21/2019  FINDINGS: Stable cardiomegaly. No pneumothorax. No definite pleural effusion. No focal  consolidation. No definite displaced rib fractures. Telemetry leads overlie the chest. IMPRESSION: No definite acute abnormality.  Cardiomegaly. Electronically Signed   By: Placido Sou M.D.   On: 10/09/2022 01:22        Scheduled Meds:  diltiazem  120 mg Oral Daily   flecainide  75 mg Oral BID   oseltamivir  75 mg Oral BID   warfarin  10 mg Oral ONCE-1600   Warfarin - Pharmacist Dosing Inpatient   Does not apply q1600   Continuous Infusions:  sodium chloride 1,000 mL (10/09/22 0427)   diltiazem (CARDIZEM) infusion 5 mg/hr (10/09/22 0431)     LOS: 0 days   Time spent= 35 mins    Inez Rosato Arsenio Loader, MD [facility]  If 7PM-7AM, please contact night-coverage  10/09/2022, 7:31 AM

## 2022-10-09 NOTE — H&P (Signed)
History and Physical    Patient: Stacy Chapman RCB:[id number] DOB: January 28, 1956 DOA: 10/09/2022 DOS: the patient was seen and examined on 10/09/2022 PCP: [name], NP  Patient coming from: Home  Chief Complaint:  Chief Complaint  Patient presents with   Fall    Fall on thinners   HPI: Stacy Chapman is a 66 y.o. female with medical history significant of A.Fib on coumadin, prior TIA.  Pt presents to ED after syncopal episode at home.  Pt with severe watery diarrhea for past several hours at home.  Lightheaded on standing.  Daughter has had similar illness for past couple of days as well.  Has fevers, no cough nor congestion.  Has nausea but no vomiting, no abd pain, no dysuria.    Review of Systems: As mentioned in the history of present illness. All other systems reviewed and are negative. Past Medical History:  Diagnosis Date   Anemia    Arthritis    Asthma    Diabetes ([place])    Essential hypertension    Morbid obesity ([place])    Persistent atrial fibrillation ([place])    Stroke ([place]) 2018   TIA   Past Surgical History:  Procedure Laterality Date   KNEE ARTHROSCOPY     TONSILLECTOMY     TUBAL LIGATION  1991   Social History:  reports that she has never smoked. She has never used smokeless tobacco. She reports that she does not currently use alcohol after a past usage of about 6.0 standard drinks of alcohol per week. She reports that she does not use drugs.  Allergies  Allergen Reactions   Lisinopril Cough    Family History  Problem Relation Age of Onset   Stroke Father 2       death   Hypertension Father    Dementia Sister    Diabetes Sister    Eczema Sister    Hypertension Brother    Stroke Brother 58       death   Cancer Brother        lung   Cancer Paternal Uncle        unknown   Dementia Sister    Colon cancer Neg Hx    Colon polyps Neg Hx    Stomach cancer Neg Hx    Rectal cancer Neg Hx    Esophageal cancer Neg Hx      Prior to Admission medications   Medication Sig Start Date End Date Taking? Authorizing Provider  acetaminophen (TYLENOL) 500 MG tablet Take 1,000 mg by mouth as needed for moderate pain.    [provider]  albuterol (VENTOLIN HFA) 108 (90 Base) MCG/ACT inhaler Need office visit for addiitonal refills 11/19/21   Nche, Charlene Brooke, NP  azelastine (ASTELIN) 0.1 % nasal spray Place 1 spray into both nostrils 2 (two) times daily. Use in each nostril as directed 08/11/22   Nche, Charlene Brooke, NP  diltiazem (CARDIZEM CD) 120 MG 24 hr capsule TAKE 1 CAPSULE BY MOUTH DAILY 10/08/22   Fenton, Clint R, PA  flecainide (TAMBOCOR) 50 MG tablet Take 1.5 tablets (75 mg total) by mouth 2 (two) times daily. 09/15/22   Fenton, Clint R, PA  fluticasone (FLONASE) 50 MCG/ACT nasal spray SHAKE LIQUID AND USE 2 SPRAYS IN EACH NOSTRIL DAILY 01/24/20   Nche, Charlene Brooke, NP  furosemide (LASIX) 20 MG tablet TAKE 1 TABLET BY MOUTH DAILY AS NEEDED FOR SWELLING OR WEIGHT GAIN 02/17/22   Allred, Jeneen Rinks, MD  gabapentin (NEURONTIN)  300 MG capsule Take 300 mg by mouth at bedtime. 04/08/22   [provider]  montelukast (SINGULAIR) 10 MG tablet TAKE 1 TABLET(10 MG) BY MOUTH AT BEDTIME 07/16/22   Nche, Charlene Brooke, NP  rosuvastatin (CRESTOR) 10 MG tablet Take 1 tablet (10 mg total) by mouth daily. 01/10/22   Nche, Charlene Brooke, NP  warfarin (COUMADIN) 5 MG tablet TAKE 1 TABLET TO 2 TABLETS BY MOUTH DAILY AS DIRECTED BY COUMADIN CLINIC 10/01/22   Malka So R, [place]    Physical Exam: Vitals:   10/09/22 0240 10/09/22 0335 10/09/22 0400 10/09/22 0430  BP: (!) 140/69 (!) 129/91 (!) 156/106 (!) 144/109  Pulse: 99 (!) 125 (!) 102 (!) 124  Resp: (!) 24 20 (!) 22 20  Temp:  99.6 F (37.6 C)    TempSrc:  Oral    SpO2: 98% 97% 96% 98%  Weight:      Height:       Constitutional: NAD, calm, comfortable Eyes: PERRL, lids and conjunctivae normal ENMT: Mucous membranes are moist. Posterior pharynx clear of any  exudate or lesions.Normal dentition.  Neck: normal, supple, no masses, no thyromegaly Respiratory: clear to auscultation bilaterally, no wheezing, no crackles. Normal respiratory effort. No accessory muscle use.  Cardiovascular: Tachycardic, irr, irr Abdomen: no tenderness, no masses palpated. No hepatosplenomegaly. Bowel sounds positive.  Musculoskeletal: no clubbing / cyanosis. No joint deformity upper and lower extremities. Good ROM, no contractures. Normal muscle tone.  Skin: no rashes, lesions, ulcers. No induration Neurologic: CN 2-12 grossly intact. Sensation intact, DTR normal. Strength 5/5 in all 4.  Psychiatric: Normal judgment and insight. Alert and oriented x 3. Normal mood.   Data Reviewed:    CT C/A/P: IMPRESSION: 1. Calcified hilar and mediastinal nodes which are enlarged. This is indeterminate and may be due to prior granulomatous infection/inflammation or treated lymphoma. 2. Question early interstitial lung disease given subpleural reticular opacities, blunt bronchiolectasis and possible early honeycombing in the lower lungs. 3. Mild small airway infection/inflammation. 4. No acute abnormality in the abdomen or pelvis.     Latest Ref Rng & Units 10/09/2022    3:50 AM 10/09/2022   12:55 AM 01/10/2022    9:47 AM  CBC  WBC 4.0 - 10.5 K/uL 5.0   4.6   Hemoglobin 12.0 - 15.0 g/dL 11.6  12.9  11.6   Hematocrit 36.0 - 46.0 % 37.3  38.0  36.1   Platelets 150 - 400 K/uL 200   223.0       Latest Ref Rng & Units 10/09/2022   12:55 AM 10/09/2022   12:40 AM 07/14/2022    8:47 AM  CMP  Glucose 70 - 99 mg/dL 90  89  98   BUN 8 - 23 mg/dL 23  18  38   Creatinine 0.44 - 1.00 mg/dL 0.90  1.04  1.17   Sodium 135 - 145 mmol/L 135  133  137   Potassium 3.5 - 5.1 mmol/L 4.7  4.6  4.1   Chloride 98 - 111 mmol/L 106  106  105   CO2 22 - 32 mmol/L  23  23   Calcium 8.9 - 10.3 mg/dL  8.8  9.7   Total Protein 6.5 - 8.1 g/dL  7.3  8.3   Total Bilirubin 0.3 - 1.2 mg/dL  0.8  0.4    Alkaline Phos 38 - 126 U/L  37  46   AST 15 - 41 U/L  27  22   ALT 0 - 44  U/L  18  17      Assessment and Plan: * Atrial fibrillation with RVR (HCC) A.Fib pathway Cont coumadin Looks like been on coumadin for a long time Not clear to me though why she hasn't been switched over to [place]? Cardizem gtt Cont home PO cardizem as well in AM Cont tambocor Tele monitor  Diarrhea of presumed infectious origin Dehydration likely caused syncope as well. Presumably viral given daughter having similar symptoms over past couple of days. GI pathogen pnl.  Abnormal CT scan of lung Question of early ILD. Consider curbsiding pulm in AM, dosent sound like this is symptomatic at this point, may just want to follow up outpt.  Hyperlipidemia Cont crestor when med rec completed.  DM (diabetes mellitus) ([place]) Looks to be diet controlled A1C 5.9 x3 months ago.      Advance Care Planning:   Code Status: Full Code   Consults: None  Family Communication: Daughter at bedside  Severity of Illness: The appropriate patient status for this patient is OBSERVATION. Observation status is judged to be reasonable and necessary in order to provide the required intensity of service to ensure the patient's safety. The patient's presenting symptoms, physical exam findings, and initial radiographic and laboratory data in the context of their medical condition is felt to place them at decreased risk for further clinical deterioration. Furthermore, it is anticipated that the patient will be medically stable for discharge from the hospital within 2 midnights of admission.   Author: Etta Quill., DO 10/09/2022 4:36 AM  For on call review [email or web address].

## 2022-10-09 NOTE — Consult Note (Addendum)
Cardiology Consultation   Patient ID: Stacy Chapman MRN: [id number]; DOB: 1956/01/11  Admit date: 10/09/2022 Date of Consult: 10/09/2022  PCP:  [name], NP   [place] Prov[facility] Cardiologist:  Mertie Moores, MD   Patient Profile:   Stacy Chapman is a 66 y.o. female with a hx of PAF on coumadin, hx of TIA, HTN, obesity, and DM, asthma who is being seen 10/09/2022 for the evaluation of Afib RVR with pauses and syncope at the request of Dr. Reesa Chew.  History of Present Illness:   Stacy Chapman carries a remote diagnosis of Afib and was on xarelto at one point - details unclear. She was seen in the ER 08/21/19 with SOB and weakness found to be in Afib. No DCCV as onset was unclear and she was not anticoagulated. Follow up heart monitor showed 100% Afib and brief VT. She was discharged on xarelto, but subsequently stopped xarelto due to cost. She is unaware of her rhythm. She does have a history of TIA and therefore was started on coumadin. He has followed in the Afib clinic and was subsequently started on flecainide 75 mg BID and 120 mg cardizem and has maintained sinus rhythm.    Last echo 2020 showed preserved EF, moderate MR and moderate TR.   She was last seen in Afib clinic 05/01/22 and was doing well.   She presented to [facility] last evening 10/09/22 after a syncopal episode. She did hit her head and underwent brain imaging that was negative for intracranial bleed. She reported several hours of watery diarrhea prior to her syncopal event. She also reported fever at home. She was noted to be in Afib RVR.   Na 133 Albumin 3.1 UA  Negative procalcitonin INR 1.8 BNP 512  Influenza A positive.   She was admitted to medicine service and started on cardizem gtt for rate control. She was also continued on her home PO cardizem and flecainide.   She subsequently had several 3-4 sec pauses. Cardiology was consulted.   During my interview, she states that  she was had been sick during the day with fever and cough. Other family members had been diagnosed with flu. She did not eat well during the day but felt better after tylenol. She was apparently up at midnight cooking for the grandchildren when she felt hot and felt like she might pass out. She reached for a vacuum but landed on the floor striking her head.   She is unaware of afib/flutter and pauses. She has only been taking flecainide once daily and has been out of her cardizem for 3-4 days.    Past Medical History:  Diagnosis Date   Anemia    Arthritis    Asthma    Diabetes ([place])    Essential hypertension    Morbid obesity ([place])    Persistent atrial fibrillation ([place])    Stroke ([place]) 2018   TIA    Past Surgical History:  Procedure Laterality Date   KNEE ARTHROSCOPY     TONSILLECTOMY     TUBAL LIGATION  1991     Home Medications:  Prior to Admission medications   Medication Sig Start Date End Date Taking? Authorizing Provider  acetaminophen (TYLENOL) 500 MG tablet Take 1,000 mg by mouth as needed for moderate pain.    [provider]  albuterol (VENTOLIN HFA) 108 (90 Base) MCG/ACT inhaler Need office visit for addiitonal refills 11/19/21   Nche, Charlene Brooke, NP  azelastine (ASTELIN) 0.1 %  nasal spray Place 1 spray into both nostrils 2 (two) times daily. Use in each nostril as directed 08/11/22   Nche, Charlene Brooke, NP  diltiazem (CARDIZEM CD) 120 MG 24 hr capsule TAKE 1 CAPSULE BY MOUTH DAILY 10/08/22   Fenton, Clint R, PA  flecainide (TAMBOCOR) 50 MG tablet Take 1.5 tablets (75 mg total) by mouth 2 (two) times daily. 09/15/22   Fenton, Clint R, PA  fluticasone (FLONASE) 50 MCG/ACT nasal spray SHAKE LIQUID AND USE 2 SPRAYS IN EACH NOSTRIL DAILY 01/24/20   Nche, Charlene Brooke, NP  furosemide (LASIX) 20 MG tablet TAKE 1 TABLET BY MOUTH DAILY AS NEEDED FOR SWELLING OR WEIGHT GAIN 02/17/22   Allred, Jeneen Rinks, MD  gabapentin (NEURONTIN) 300 MG capsule Take 300 mg by mouth at  bedtime. 04/08/22   [provider]  montelukast (SINGULAIR) 10 MG tablet TAKE 1 TABLET(10 MG) BY MOUTH AT BEDTIME 07/16/22   Nche, Charlene Brooke, NP  rosuvastatin (CRESTOR) 10 MG tablet Take 1 tablet (10 mg total) by mouth daily. 01/10/22   Nche, Charlene Brooke, NP  warfarin (COUMADIN) 5 MG tablet TAKE 1 TABLET TO 2 TABLETS BY MOUTH DAILY AS DIRECTED BY COUMADIN CLINIC 10/01/22   Fenton, [place] R, PA    Inpatient Medications: Scheduled Meds:  diltiazem  120 mg Oral Daily   flecainide  75 mg Oral BID   ipratropium-albuterol  3 mL Nebulization BID   oseltamivir  75 mg Oral BID   warfarin  10 mg Oral ONCE-1600   Warfarin - Pharmacist Dosing Inpatient   Does not apply q1600   Continuous Infusions:  sodium chloride 75 mL/hr at 10/09/22 0912   PRN Meds: acetaminophen, guaiFENesin, hydrALAZINE, ipratropium-albuterol, metoprolol tartrate, ondansetron (ZOFRAN) IV, oxyCODONE, senna-docusate, traZODone  Allergies:    Allergies  Allergen Reactions   Lisinopril Cough    Social History:   Social History   Socioeconomic History   Marital status: Divorced    Spouse name: Not on file   Number of children: Not on file   Years of education: Not on file   Highest education level: Not on file  Occupational History   Not on file  Tobacco Use   Smoking status: Never   Smokeless tobacco: Never   Tobacco comments:    Never smoke 05/01/22  Vaping Use   Vaping Use: Never used  Substance and Sexual Activity   Alcohol use: Not Currently    Alcohol/week: 6.0 standard drinks of alcohol    Types: 1 Glasses of wine, 5 Standard drinks or equivalent per week    Comment: Drinks multiple times a week   Drug use: No   Sexual activity: Yes    Birth control/protection: Post-menopausal, Surgical    Comment: s/p tubal ligation  Other Topics Concern   Not on file  Social History Narrative   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   Social Determinants of Radio broadcast assistant  Strain: Not on file  Food Insecurity: Not on file  Transportation Needs: Not on file  Physical Activity: Not on file  Stress: Not on file  Social Connections: Not on file  Intimate Partner Violence: Not on file    Family History:    Family History  Problem Relation Age of Onset   Stroke Father 21       death   Hypertension Father    Dementia Sister    Diabetes Sister    Eczema Sister    Hypertension Brother    Stroke Brother  56       death   Cancer Brother        lung   Cancer Paternal Uncle        unknown   Dementia Sister    Colon cancer Neg Hx    Colon polyps Neg Hx    Stomach cancer Neg Hx    Rectal cancer Neg Hx    Esophageal cancer Neg Hx      ROS:  Please see the history of present illness.   All other ROS reviewed and negative.     Physical Exam/Data:   Vitals:   10/09/22 1017 10/09/22 1115 10/09/22 1130 10/09/22 1145  BP: 108/86 (!) 140/90 (!) 140/68 128/72  Pulse: 99 91 62 (!) 140  Resp: '18 20 20 18  '$ Temp:      TempSrc:      SpO2: 97% 96% 95% 97%  Weight:      Height:       No intake or output data in the 24 hours ending 10/09/22 1314    10/09/2022   12:49 AM 07/11/2022   10:39 AM 05/01/2022   10:33 AM  Last 3 Weights  Weight (lbs) 220 lb 225 lb 6.4 oz 219 lb 12.8 oz  Weight (kg) 99.791 kg 102.241 kg 99.701 kg     Body mass index is 34.46 kg/m.  General:  Well nourished, well developed, in no acute distress HEENT: normal Neck: no JVD Vascular: No carotid bruits; Distal pulses 2+ bilaterally Cardiac:  irregular rhythm regular rate, no murmur Lungs:  wheezing throughout  Abd: soft, nontender, no hepatomegaly  Ext: no edema Musculoskeletal:  No deformities, BUE and BLE strength normal and equal Skin: warm and dry  Neuro:  CNs 2-12 intact, no focal abnormalities noted Psych:  Normal affect   EKG:  The EKG was personally reviewed and demonstrates:  Afib VR 125, poor R wave progression Telemetry:  Telemetry was personally reviewed and  demonstrates:  episodes of atrial fibrillation and atrial flutter with rates primarily in the 80-90s, pauses noted 3.46-4.3 sec appear to be primarily post-conversion pauses with some sinus beats and ventricular escape beats, she has not maintained sinus rhythm  Relevant CV Studies:  Echo 09/11/2029:  1. Left ventricular ejection fraction, by visual estimation, is 55 to  60%. The left ventricle has normal function. There is no left ventricular  hypertrophy.   2. Left ventricular diastolic parameters are indeterminate.   3. Global right ventricle has normal systolic function.The right  ventricular size is normal. No increase in right ventricular wall  thickness.   4. Left atrial size was mildly dilated.   5. Right atrial size was normal.   6. The mitral valve is normal in structure. Moderate mitral valve  regurgitation. No evidence of mitral stenosis.   7. The tricuspid valve is normal in structure. Tricuspid valve  regurgitation moderate.   8. The aortic valve is normal in structure. Aortic valve regurgitation is  not visualized. No evidence of aortic valve sclerosis or stenosis.   9. The pulmonic valve was normal in structure. Pulmonic valve  regurgitation is mild.  10. Mildly elevated pulmonary artery systolic pressure.  11. The inferior vena cava is normal in size with greater than 50%  respiratory variability, suggesting right atrial pressure of 3 mmHg.   Laboratory Data:  High Sensitivity Troponin:  No results for input(s): "TROPONINIHS" in the last 720 hours.   Chemistry Recent Labs  Lab 10/09/22 0040 10/09/22 0055  NA 133* 135  K 4.6 4.7  CL 106 106  CO2 23  --   GLUCOSE 89 90  BUN 18 23  CREATININE 1.04* 0.90  CALCIUM 8.8*  --   GFRNONAA 59*  --   ANIONGAP 4*  --     Recent Labs  Lab 10/09/22 0040  PROT 7.3  ALBUMIN 3.1*  AST 27  ALT 18  ALKPHOS 37*  BILITOT 0.8   Lipids No results for input(s): "CHOL", "TRIG", "HDL", "LABVLDL", "LDLCALC", "CHOLHDL" in  the last 168 hours.  Hematology Recent Labs  Lab 10/09/22 0055 10/09/22 0350  WBC  --  5.0  RBC  --  4.14  HGB 12.9 11.6*  HCT 38.0 37.3  MCV  --  90.1  MCH  --  28.0  MCHC  --  31.1  RDW  --  14.3  PLT  --  200   Thyroid No results for input(s): "TSH", "FREET4" in the last 168 hours.  BNP Recent Labs  Lab 10/09/22 0350  BNP 512.3*    DDimer No results for input(s): "DDIMER" in the last 168 hours.   Radiology/Studies:  CT CHEST ABDOMEN PELVIS W CONTRAST  Result Date: 10/09/2022 CLINICAL DATA:  Weakness.  Lung mass EXAM: CT CHEST, ABDOMEN, AND PELVIS WITH CONTRAST TECHNIQUE: Multidetector CT imaging of the chest, abdomen and pelvis was performed following the standard protocol during bolus administration of intravenous contrast. RADIATION DOSE REDUCTION: This exam was performed according to the departmental dose-optimization program which includes automated exposure control, adjustment of the mA and/or kV according to patient size and/or use of iterative reconstruction technique. CONTRAST:  57m OMNIPAQUE IOHEXOL 350 MG/ML SOLN COMPARISON:  Radiograph 10/09/2022 FINDINGS: CT CHEST FINDINGS Cardiovascular: Normal heart size. No pericardial effusion. The main pulmonary artery is enlarged measuring 3.5 cm. No central pulmonary embolus. Mediastinum/Nodes: Calcified hilar and mediastinal nodes. The mediastinal nodes are enlarged. For example 1.9 cm subcarinal node. Unremarkable esophagus. Lungs/Pleura: Scarring in the lower lungs. Subpleural reticular opacities and bronchiolectasis with question of early honeycombing. There are a few scattered centrilobular ground-glass micro nodules compatible with small airway infection/inflammation. No pleural effusion or pneumothorax. Musculoskeletal: No acute abnormality. CT ABDOMEN PELVIS FINDINGS Hepatobiliary: No focal hepatic lesion. Unremarkable gallbladder. No biliary dilation. Pancreas: Unremarkable. No pancreatic ductal dilatation or surrounding  inflammatory changes. Spleen: Normal in size without focal abnormality. Adrenals/Urinary Tract: Unremarkable adrenal glands. Low-attenuation lesions in the kidneys are statistically likely to represent cysts. No follow-up is required. No urinary calculi or hydronephrosis. Unremarkable bladder. Stomach/Bowel: Normal caliber large and small bowel. No bowel wall thickening. Appendix is unremarkable. Stomach is decompressed. Vascular/Lymphatic: Mild aorto bi-iliac atherosclerotic calcification. No suspicious lymphadenopathy. Reproductive: Uterus and bilateral adnexa are unremarkable. Other: No free intraperitoneal fluid or air. Musculoskeletal: No acute fracture. IMPRESSION: 1. Calcified hilar and mediastinal nodes which are enlarged. This is indeterminate and may be due to prior granulomatous infection/inflammation or treated lymphoma. 2. Question early interstitial lung disease given subpleural reticular opacities, blunt bronchiolectasis and possible early honeycombing in the lower lungs. 3. Mild small airway infection/inflammation. 4. No acute abnormality in the abdomen or pelvis. Electronically Signed   By: TPlacido SouM.D.   On: 10/09/2022 04:04   CT HEAD WO CONTRAST  Result Date: 10/09/2022 CLINICAL DATA:  Trauma. EXAM: CT HEAD WITHOUT CONTRAST CT CERVICAL SPINE WITHOUT CONTRAST TECHNIQUE: Multidetector CT imaging of the head and cervical spine was performed following the standard protocol without intravenous contrast. Multiplanar CT image reconstructions of the cervical spine were also generated. RADIATION DOSE REDUCTION: This  exam was performed according to the departmental dose-optimization program which includes automated exposure control, adjustment of the mA and/or kV according to patient size and/or use of iterative reconstruction technique. COMPARISON:  None Available. FINDINGS: CT HEAD FINDINGS Brain: The ventricles and sulci appropriate size for patient's age. Mild periventricular and deep white  matter chronic microvascular ischemic changes noted. There is no acute intracranial hemorrhage. No mass effect or midline shift. No extra-axial fluid collection. Vascular: None Skull: Normal. Negative for fracture or focal lesion. Sinuses/Orbits: Diffuse mucoperiosteal thickening of paranasal sinuses. No air-fluid level. The mastoid air cells are clear. Other: None CT CERVICAL SPINE FINDINGS Alignment: No acute subluxation. Skull base and vertebrae: No acute fracture. Soft tissues and spinal canal: No prevertebral fluid or swelling. No visible canal hematoma. Disc levels:  No acute findings.  Degenerative changes. Upper chest: A 6 mm right apical nodule or a vascular confluence (93/4 and 17/9). Dedicated chest CT recommended for better evaluation of the lungs on a nonemergent/outpatient basis. Other: Bilateral carotid bulb calcified plaques. There is a 3.0 x 2.5 cm soft tissue density in the medial apical pleural surface. Although this may represent vascular confluence a mass is not excluded. IMPRESSION: 1. No acute intracranial pathology. 2. Mild chronic microvascular ischemic changes. 3. No acute/traumatic cervical spine pathology. 4. A 6 mm right apical nodule or a vascular confluence. Additionally there is a right apical soft tissue mass versus confluence of vessels. Dedicated chest CT with IV contrast is recommended for better evaluation of the lungs. This can be performed on a nonemergent/outpatient basis. Electronically Signed   By: Anner Crete M.D.   On: 10/09/2022 02:13   CT CERVICAL SPINE WO CONTRAST  Result Date: 10/09/2022 CLINICAL DATA:  Trauma. EXAM: CT HEAD WITHOUT CONTRAST CT CERVICAL SPINE WITHOUT CONTRAST TECHNIQUE: Multidetector CT imaging of the head and cervical spine was performed following the standard protocol without intravenous contrast. Multiplanar CT image reconstructions of the cervical spine were also generated. RADIATION DOSE REDUCTION: This exam was performed according to  the departmental dose-optimization program which includes automated exposure control, adjustment of the mA and/or kV according to patient size and/or use of iterative reconstruction technique. COMPARISON:  None Available. FINDINGS: CT HEAD FINDINGS Brain: The ventricles and sulci appropriate size for patient's age. Mild periventricular and deep white matter chronic microvascular ischemic changes noted. There is no acute intracranial hemorrhage. No mass effect or midline shift. No extra-axial fluid collection. Vascular: None Skull: Normal. Negative for fracture or focal lesion. Sinuses/Orbits: Diffuse mucoperiosteal thickening of paranasal sinuses. No air-fluid level. The mastoid air cells are clear. Other: None CT CERVICAL SPINE FINDINGS Alignment: No acute subluxation. Skull base and vertebrae: No acute fracture. Soft tissues and spinal canal: No prevertebral fluid or swelling. No visible canal hematoma. Disc levels:  No acute findings.  Degenerative changes. Upper chest: A 6 mm right apical nodule or a vascular confluence (93/4 and 17/9). Dedicated chest CT recommended for better evaluation of the lungs on a nonemergent/outpatient basis. Other: Bilateral carotid bulb calcified plaques. There is a 3.0 x 2.5 cm soft tissue density in the medial apical pleural surface. Although this may represent vascular confluence a mass is not excluded. IMPRESSION: 1. No acute intracranial pathology. 2. Mild chronic microvascular ischemic changes. 3. No acute/traumatic cervical spine pathology. 4. A 6 mm right apical nodule or a vascular confluence. Additionally there is a right apical soft tissue mass versus confluence of vessels. Dedicated chest CT with IV contrast is recommended for better evaluation of the  lungs. This can be performed on a nonemergent/outpatient basis. Electronically Signed   By: Anner Crete M.D.   On: 10/09/2022 02:13   DG Chest Port 1 View  Result Date: 10/09/2022 CLINICAL DATA:  Level 2 fall on  blood thinners EXAM: PORTABLE CHEST 1 VIEW COMPARISON:  08/21/2019 FINDINGS: Stable cardiomegaly. No pneumothorax. No definite pleural effusion. No focal consolidation. No definite displaced rib fractures. Telemetry leads overlie the chest. IMPRESSION: No definite acute abnormality.  Cardiomegaly. Electronically Signed   By: Placido Sou M.D.   On: 10/09/2022 01:22     Assessment and Plan:   Atrial fibrillation with RVR PAF Pauses - 112.5 mg flecainide BID + 120 mg cardizem - cardizem gtt now off after pauses - concern for tachy-brady syndrome, but also in the setting of acute illness - telemetry reviewed - appears to be having post-conversion pauses between 3.46 - 4.3 sec - complicated by the fact she has only been taking flecainide once daily and has been out of cardizem for 3-4 days - in the setting of acute illness - agree with 75 mg flecainide BID - suspect controlling Afib/flutter will be difficult in the setting of acute illness - will not consider this a flecainide failure as she may have only been taking it once daily at home - will obtain an echo   Syncope - likely multifactorial in the setting of several hours of severe watery diarrhea + poor PO intake + influenza A positive, subjective fevers + Afib RVR with pauses - once improved from respiratory illness, can consider heart monitor   Chronic anticoagulation Stopped xarelto due to cost Has been maintained on coumadin Per pharmacy   Hypertension BP labile   Influenza A Febrile today Per primary    Risk Assessment/Risk Scores:   CHA2DS2-VASc Score = 6   This indicates a 9.7% annual risk of stroke. The patient's score is based upon: CHF History: 0 HTN History: 1 Diabetes History: 1 Stroke History: 2 Vascular Disease History: 0 Age Score: 1 Gender Score: 1     For questions or updates, please contact [place] Please consult [email or web address] for contact info under    Signed, Ledora Bottcher, [place]  10/09/2022 1:14 PM  Attending Note:   The patient was seen and examined.  Agree with assessment and plan as noted above.  Changes made to the above note as needed.  Patient seen and independently examined with Doreene Adas, PA .   We discussed all aspects of the encounter. I agree with the assessment and plan as stated above.   1.  Syncope: Patient presents with an episode of syncope.  She is in atrial fibrillation with a rapid ventricular response.  She does have some postconversion pauses that we have noted.  Of note is that she has had influenza.  She has not eaten well for the past 2 days.  In fact she did not have anything to eat yesterday.  She has had lots of watery diarrhea over the past several days.  She has not been taking her flecainide or diltiazem medication.  I would allow her to get over her episode of influenza.  She needs to eat and drink better.  Will continue to control her atrial fibrillation.  Restart flecainide and Cardizem.  Restart Coumadin. Continue her on IV heparin until she is therapeutic from her Coumadin.  2    Paroxysmal atrial fibrillation: She has intermittent episodes of normal sinus rhythm mostly has atrial fibrillation.  This A-fib  is being exacerbated by her influenza.  She also has not been taking her flecainide or diltiazem regularly.  Will restart flecainide.  Restart diltiazem.  It appears that her home dose of flecainide is 112.5 mg (1.5  75 mg tabs)  twice daily.  3.  HTN:   she has not been taking her meds as directed.   Will titrate meds as she gets over her influenza infection      I have spent a total of 40 minutes with patient reviewing hospital  notes , telemetry, EKGs, labs and examining patient as well as establishing an assessment and plan that was discussed with the patient.  > 50% of time was spent in direct patient care.    Thayer Headings, Brooke Bonito., MD, [facility] 10/09/2022, 3:45 PM [address]  [place] Pager [phone]

## 2022-10-09 NOTE — Progress Notes (Signed)
ANTICOAGULATION CONSULT NOTE - Initial Consult  Pharmacy Consult for Warfarin  Indication: atrial fibrillation  Allergies  Allergen Reactions   Lisinopril Cough    Patient Measurements: Height: '5\' 7"'$  (170.2 cm) Weight: 99.8 kg (220 lb) IBW/kg (Calculated) : 61.6  Vital Signs: Temp: 99.6 F (37.6 C) (12/28 0335) Temp Source: Oral (12/28 0335) BP: 144/109 (12/28 0430) Pulse Rate: 124 (12/28 0430)  Labs: Recent Labs    10/09/22 0040 10/09/22 0055 10/09/22 0350  HGB  --  12.9 11.6*  HCT  --  38.0 37.3  PLT  --   --  200  LABPROT  --   --  20.8*  INR  --   --  1.8*  CREATININE 1.04* 0.90  --     Estimated Creatinine Clearance: 74.6 mL/min (by C-G formula based on SCr of 0.9 mg/dL).   Medical History: Past Medical History:  Diagnosis Date   Anemia    Arthritis    Asthma    Diabetes ([place])    Essential hypertension    Morbid obesity ([place])    Persistent atrial fibrillation ([place])    Stroke ([place]) 2018   TIA     Assessment: 66 y/o F in the ED s/p fall, on warfarin PTA for afib, CT imaging is negative for any bleeding, continuing warfarin, INR is sub-therapeutic at 1.8  Warfarin PTA dosing: 10 mg on Sunday and Thursday, 5 mg all other days  Goal of Therapy:  INR 2-3 Monitor platelets by anticoagulation protocol: Yes   Plan:  Warfarin 10 mg PO x 1 today at 1600 Daily PT/INR Monitor for bleeding  Narda Bonds, PharmD, [place] Pharmacist Phone: [phone]

## 2022-10-09 NOTE — Assessment & Plan Note (Signed)
Cont crestor when med rec completed.

## 2022-10-09 NOTE — Progress Notes (Signed)
  Echocardiogram 2D Echocardiogram has been performed.  Stacy Chapman 10/09/2022, 4:54 PM

## 2022-10-09 NOTE — Progress Notes (Signed)
Orthopedic Tech Progress Note Patient Details:  Stacy Chapman [birth year] [id number]  Patient ID: Stacy Chapman, female   DOB: Oct 27, 1955, 66 y.o.   MRN: [id number] Level II; not currently needed. Stacy Chapman 10/09/2022, 12:38 AM

## 2022-10-09 NOTE — ED Notes (Signed)
Patient is vomiting, zofran given

## 2022-10-09 NOTE — ED Provider Notes (Signed)
[facility] EMERGENCY DEPARTMENT Provider Note  CSN: [id number] Arrival date & time:    Chief Complaint(s) Fall (Fall on thinners)  HPI Stacy Chapman is a 66 y.o. female with a past medical history listed below including atrial fibrillation on Coumadin who presents to the emergency department after syncopal episode at home resulting in head trauma.  She presents as a level 2 trauma due to head injury on blood thinners.  Patient reports that for the past several hours she has been having several bouts of watery diarrhea and has had lightheadedness upon standing.  Reports that her daughter had some illness in the past couple days as well.  Patient endorses fevers.  No coughing or congestion.  Endorses nausea without emesis.  No abdominal pain.  No urinary symptoms.  No other physical complaints.  The history is provided by the patient and the EMS personnel.    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 04/30/2022   Atrial fibrillation ([place]) 09/10/2021   Encounter for therapeutic drug monitoring 09/10/2021   Hyperlipidemia 01/11/2021   Persistent atrial fibrillation ([place]) 10/17/2019   Secondary hypercoagulable state ([place]) 08/23/2019   Mild intermittent asthma with acute exacerbation 10/22/2018   Paroxysmal atrial fibrillation ([place]) 07/19/2018   Bronchitis 03/05/2017   Hypertensive disorder 01/22/2017   Elevated liver enzymes 01/22/2017   Chronic pansinusitis 01/22/2017   DM (diabetes mellitus) ([place]) 01/22/2017   Abnormal head CT 01/22/2017   Home Medication(s) Prior to Admission medications   Medication Sig Start Date End Date Taking? Authorizing Provider   acetaminophen (TYLENOL) 500 MG tablet Take 1,000 mg by mouth as needed for moderate pain.    [provider]  albuterol (VENTOLIN HFA) 108 (90 Base) MCG/ACT inhaler Need office visit for addiitonal refills 11/19/21   Nche, Charlene Brooke, NP  azelastine (ASTELIN) 0.1 % nasal spray Place 1 spray into both nostrils 2 (two) times daily. Use in each nostril as directed 08/11/22   Nche, Charlene Brooke, NP  diltiazem (CARDIZEM CD) 120 MG 24 hr capsule TAKE 1 CAPSULE BY MOUTH DAILY 10/08/22   Fenton, Clint R, PA  flecainide (TAMBOCOR) 50 MG tablet Take 1.5 tablets (75 mg total) by mouth 2 (two) times daily. 09/15/22   Fenton, Clint R, PA  fluticasone (FLONASE) 50 MCG/ACT nasal spray SHAKE LIQUID AND USE 2 SPRAYS IN EACH NOSTRIL DAILY 01/24/20   Nche, Charlene Brooke, NP  furosemide (LASIX) 20 MG tablet TAKE 1 TABLET BY MOUTH DAILY AS NEEDED FOR SWELLING OR WEIGHT GAIN 02/17/22   Allred, Jeneen Rinks, MD  gabapentin (NEURONTIN) 300 MG capsule Take 300 mg by mouth at bedtime. 04/08/22   [provider]  montelukast (SINGULAIR) 10 MG tablet TAKE 1 TABLET(10 MG) BY MOUTH AT BEDTIME 07/16/22   Nche, Charlene Brooke, NP  rosuvastatin (CRESTOR) 10 MG tablet Take 1 tablet (10 mg total) by mouth daily. 01/10/22   Nche, Charlene Brooke, NP  warfarin (COUMADIN) 5 MG tablet TAKE 1 TABLET TO 2 TABLETS BY MOUTH DAILY AS DIRECTED BY COUMADIN CLINIC 10/01/22   Fenton, Doloris Hall, PA  Allergies Lisinopril  Review of Systems Review of Systems As noted in HPI  Physical Exam Vital Signs  I have reviewed the triage vital signs BP 118/80 (BP Location: Right Arm)   Pulse (!) 118   Temp 98.9 F (37.2 C) (Oral)   Resp 18   Ht '5\' 7"'$  (1.702 m)   Wt 99.8 kg   SpO2 100%   BMI 34.46 kg/m   Physical Exam Constitutional:      General: She is not in acute distress.    Appearance: She is  well-developed. She is not diaphoretic.  HENT:     Head: Normocephalic and atraumatic.     Right Ear: External ear normal.     Left Ear: External ear normal.     Nose: Nose normal.  Eyes:     General: No scleral icterus.       Right eye: No discharge.        Left eye: No discharge.     Conjunctiva/sclera: Conjunctivae normal.     Pupils: Pupils are equal, round, and reactive to light.  Cardiovascular:     Rate and Rhythm: Tachycardia present. Rhythm irregularly irregular.     Pulses:          Radial pulses are 2+ on the right side and 2+ on the left side.       Dorsalis pedis pulses are 2+ on the right side and 2+ on the left side.     Heart sounds: Normal heart sounds. No murmur heard.    No friction rub. No gallop.  Pulmonary:     Effort: Pulmonary effort is normal. No respiratory distress.     Breath sounds: Normal breath sounds. No stridor. No wheezing.  Abdominal:     General: There is no distension.     Palpations: Abdomen is soft.     Tenderness: There is no abdominal tenderness.  Musculoskeletal:        General: No tenderness.     Cervical back: Normal range of motion and neck supple. No bony tenderness.     Thoracic back: No bony tenderness.     Lumbar back: No bony tenderness.     Comments: Clavicles stable. Chest stable to AP/Lat compression. Pelvis stable to Lat compression. No obvious extremity deformity. No chest or abdominal wall contusion.  Skin:    General: Skin is warm and dry.     Findings: No erythema or rash.  Neurological:     Mental Status: She is alert and oriented to person, place, and time.     Comments: Moving all extremities     ED Results and Treatments Labs (all labs ordered are listed, but only abnormal results are displayed) Labs Reviewed  I-STAT CHEM 8, ED - Abnormal; Notable for the following components:      Result Value   Calcium, Ion 1.05 (*)    All other components within normal limits  COMPREHENSIVE METABOLIC PANEL  ETHANOL   URINALYSIS, ROUTINE W REFLEX MICROSCOPIC  CBC  PROTIME-INR  SAMPLE TO BLOOD BANK  EKG  EKG Interpretation  Date/Time:    Ventricular Rate:    PR Interval:    QRS Duration:   QT Interval:    QTC Calculation:   R Axis:     Text Interpretation:         Radiology DG Chest Port 1 View  Result Date: 10/09/2022 CLINICAL DATA:  Level 2 fall on blood thinners EXAM: PORTABLE CHEST 1 VIEW COMPARISON:  08/21/2019 FINDINGS: Stable cardiomegaly. No pneumothorax. No definite pleural effusion. No focal consolidation. No definite displaced rib fractures. Telemetry leads overlie the chest. IMPRESSION: No definite acute abnormality.  Cardiomegaly. Electronically Signed   By: Placido Sou M.D.   On: 10/09/2022 01:22    Medications Ordered in ED Medications  sodium chloride 0.9 % bolus 1,000 mL (1,000 mLs Intravenous New Bag/Given 10/09/22 0128)    Followed by  0.9 %  sodium chloride infusion (has no administration in time range)                                                                                                                                     Procedures .1-3 Lead EKG Interpretation  Performed by: Fatima Blank, MD Authorized by: Fatima Blank, MD     Interpretation: abnormal     ECG rate:  112   ECG rate assessment: tachycardic     Rhythm: atrial fibrillation     Ectopy: none     Conduction: normal   .Critical Care  Performed by: Fatima Blank, MD Authorized by: Fatima Blank, MD   Critical care provider statement:    Critical care time (minutes):  45   Critical care time was exclusive of:  Separately billable procedures and treating other patients   Critical care was necessary to treat or prevent imminent or life-threatening deterioration of the following conditions:  Cardiac failure   Critical care was time  spent personally by me on the following activities:  Development of treatment plan with patient or surrogate, discussions with consultants, evaluation of patient's response to treatment, examination of patient, obtaining history from patient or surrogate, review of old charts, re-evaluation of patient's condition, pulse oximetry, ordering and review of radiographic studies, ordering and review of laboratory studies and ordering and performing treatments and interventions   Care discussed with: admitting provider     (including critical care time)  Medical Decision Making / ED Course   Medical Decision Making Amount and/or Complexity of Data Reviewed Labs: ordered. Decision-making details documented in ED Course. Radiology: ordered and independent interpretation performed. Decision-making details documented in ED Course. ECG/medicine tests: ordered and independent interpretation performed. Decision-making details documented in ED Course.  Risk Prescription drug management. Decision regarding hospitalization.   Syncope. Differential includes dehydration from GI losses, tachydysrhythmia from of A-fib RVR, will assess for evidence of sepsis from bacterial infection, will also assess for severe anemia given her anticoagulation.  CBC without leukocytosis.  Mild  anemia with stable hemoglobin. Metabolic panel without significant electrolyte derangements no renal sufficiency. UA without evidence of infection Patient was provided with IV fluids but remained in A-fib RVR.  Patient required dill drip.  Head trauma on blood thinners Level 2 activation ABCs intact Secondary as above No serious injuries noted on exam CT head and cervical spine negative for ICH or acute injuries.  Incidental findings on CT of the cervical spine including apical nodules.  CT of the chest obtained confirming the calcified nodules.   Patient admitted to medicine for further management.      Final Clinical  Impression(s) / ED Diagnoses Final diagnoses:  Atrial fibrillation with RVR ([place])  Syncope and collapse  Diarrhea of presumed infectious origin           This chart was dictated using voice recognition software.  Despite best efforts to proofread,  errors can occur which can change the documentation meaning.    Fatima Blank, MD 10/09/22 [phone]

## 2022-10-09 NOTE — ED Triage Notes (Signed)
Pt BIB [facility] w/ a fall on thinners. Pt takes warfarin at home. Pt also took Dayquil earlier today in the afternoon. Pt felt dizzy and nauseous en route to the ED.   EMS VS BP 130/62 P 90-130 CBG 105

## 2022-10-09 NOTE — Progress Notes (Signed)
[place] for Heparin while INR is low Indication: atrial fibrillation  Allergies  Allergen Reactions   Lisinopril Cough    Patient Measurements: Height: '5\' 7"'$  (170.2 cm) Weight: 99.8 kg (220 lb) IBW/kg (Calculated) : 61.6  Vital Signs: Temp: 100.8 F (38.2 C) (12/28 2250) Temp Source: Oral (12/28 2250) BP: 120/60 (12/28 2250) Pulse Rate: 79 (12/28 2250)  Labs: Recent Labs    10/09/22 0040 10/09/22 0055 10/09/22 0350 10/09/22 2311  HGB  --  12.9 11.6*  --   HCT  --  38.0 37.3  --   PLT  --   --  200  --   LABPROT  --   --  20.8*  --   INR  --   --  1.8*  --   HEPARINUNFRC  --   --   --  0.58  CREATININE 1.04* 0.90  --   --      Estimated Creatinine Clearance: 74.6 mL/min (by C-G formula based on SCr of 0.9 mg/dL).   Medical History: Past Medical History:  Diagnosis Date   Anemia    Arthritis    Asthma    Diabetes ([place])    Essential hypertension    Morbid obesity ([place])    Persistent atrial fibrillation ([place])    Stroke ([place]) 2018   TIA     Assessment: 66 y/o F in the ED s/p fall, on warfarin PTA for afib, CT imaging is negative for any bleeding, continuing warfarin, INR is sub-therapeutic at 1.8  Warfarin PTA dosing: 10 mg on Sunday and Thursday, 5 mg all other days  Warfarin continued on admission, now to add IV heparin to cover until INR therapeutic per cards  12/28 PM update:  Initial heparin level is therapeutic  Goal of Therapy:  Heparin level 0.3-0.7 units/mL INR 2-3 Monitor platelets by anticoagulation protocol: Yes   Plan:  Cont heparin drip at 1200 units/hr Heparin level and INR with AM labs  Narda Bonds, PharmD, [facility] Pharmacist Phone: [phone]

## 2022-10-09 NOTE — Progress Notes (Signed)
Influenza A positive.  Ordering tamiflu.

## 2022-10-09 NOTE — ED Notes (Signed)
ED TO INPATIENT HANDOFF REPORT  ED Nurse Name and Phone #: [phone]  S Name/Age/Gender Stacy Chapman 66 y.o. female Room/Bed: 001C/001C  Code Status   Code Status: Full Code  Home/SNF/Other Home Patient oriented to: self, place, time, and situation Is this baseline? Yes   Triage Complete: Triage complete  Chief Complaint Atrial fibrillation with RVR ([place]) [I48.91]  Triage Note Pt BIB [facility] w/ a fall on thinners. Pt takes warfarin at home. Pt also took Dayquil earlier today in the afternoon. Pt felt dizzy and nauseous en route to the ED.   EMS VS BP 130/62 P 90-130 CBG 105    Allergies Allergies  Allergen Reactions   Lisinopril Cough    Level of Care/Admitting Diagnosis ED Disposition     ED Disposition  Admit   Condition  --   [place]: [place] [[id number]]  Level of Care: Telemetry Cardiac [[id number]]  May place patient in observation at [facility] or [place] if equivalent level of care is available:: No  Covid Evaluation: Asymptomatic - no recent exposure (last 10 days) testing not required  Diagnosis: Atrial fibrillation with RVR [facility]) [[id number]]  Admitting Physician: Etta Quill [[id number]]  Attending Physician: Etta Quill [[id number]]          B Medical/Surgery History Past Medical History:  Diagnosis Date   Anemia    Arthritis    Asthma    Diabetes ([place])    Essential hypertension    Morbid obesity ([place])    Persistent atrial fibrillation ([place])    Stroke ([place]) 2018   TIA   Past Surgical History:  Procedure Laterality Date   KNEE ARTHROSCOPY     TONSILLECTOMY     TUBAL LIGATION  1991     A IV Location/Drains/Wounds Patient Lines/Drains/Airways Status     Active Line/Drains/Airways     Name Placement date Placement time Site Days   Peripheral IV 10/09/22 20 G Left;Posterior Hand 10/09/22  0144  Hand  less than 1   External Urinary Catheter 10/09/22  0439  --  less than 1             Intake/Output Last 24 hours No intake or output data in the 24 hours ending 10/09/22 1740  Labs/Imaging Results for orders placed or performed during the hospital encounter of 10/09/22 (from the past 48 hour(s))  Comprehensive metabolic panel     Status: Abnormal   Collection Time: 10/09/22 12:40 AM  Result Value Ref Range   Sodium 133 (L) 135 - 145 mmol/L   Potassium 4.6 3.5 - 5.1 mmol/L   Chloride 106 98 - 111 mmol/L   CO2 23 22 - 32 mmol/L   Glucose, Bld 89 70 - 99 mg/dL    Comment: Glucose reference range applies only to samples taken after fasting for at least 8 hours.   BUN 18 8 - 23 mg/dL   Creatinine, Ser 1.04 (H) 0.44 - 1.00 mg/dL   Calcium 8.8 (L) 8.9 - 10.3 mg/dL   Total Protein 7.3 6.5 - 8.1 g/dL   Albumin 3.1 (L) 3.5 - 5.0 g/dL   AST 27 15 - 41 U/L   ALT 18 0 - 44 U/L   Alkaline Phosphatase 37 (L) 38 - 126 U/L   Total Bilirubin 0.8 0.3 - 1.2 mg/dL   GFR, Estimated 59 (L) >60 mL/min    Comment: (NOTE) Calculated using the CKD-EPI Creatinine Equation (2021)    Anion gap 4 (L)  5 - 15    Comment: Performed at [facility], [place] [address] [place], [place] [address]  Ethanol     Status: None   Collection Time: 10/09/22 12:40 AM  Result Value Ref Range   Alcohol, Ethyl (B) <10 <10 mg/dL    Comment: (NOTE) Lowest detectable limit for serum alcohol is 10 mg/dL.  For medical purposes only. Performed at [facility], [place] [address], [place], [place] [address]   Urinalysis, Routine w reflex microscopic     Status: Abnormal   Collection Time: 10/09/22 12:40 AM  Result Value Ref Range   Color, Urine STRAW (A) YELLOW   APPearance CLEAR CLEAR   Specific Gravity, Urine 1.010 1.005 - 1.030   pH 6.0 5.0 - 8.0   Glucose, UA NEGATIVE NEGATIVE mg/dL   Hgb urine dipstick SMALL (A) NEGATIVE   Bilirubin Urine NEGATIVE NEGATIVE   Ketones, ur NEGATIVE NEGATIVE mg/dL   Protein, ur NEGATIVE NEGATIVE mg/dL   Nitrite NEGATIVE NEGATIVE   Leukocytes,Ua NEGATIVE  NEGATIVE   RBC / HPF 0-5 0 - 5 RBC/hpf   WBC, UA 0-5 0 - 5 WBC/hpf   Bacteria, UA NONE SEEN NONE SEEN   Squamous Epithelial / LPF 0-5 0 - 5 /HPF    Comment: Performed at [facility], [place] [address] [place], [place] [address]  Procalcitonin - Baseline     Status: None   Collection Time: 10/09/22 12:50 AM  Result Value Ref Range   Procalcitonin <0.10 ng/mL    Comment:        Interpretation: PCT (Procalcitonin) <= 0.5 ng/mL: Systemic infection (sepsis) is not likely. Local bacterial infection is possible. (NOTE)       Sepsis PCT Algorithm           Lower Respiratory Tract                                      Infection PCT Algorithm    ----------------------------     ----------------------------         PCT < 0.25 ng/mL                PCT < 0.10 ng/mL          Strongly encourage             Strongly discourage   discontinuation of antibiotics    initiation of antibiotics    ----------------------------     -----------------------------       PCT 0.25 - 0.50 ng/mL            PCT 0.10 - 0.25 ng/mL               OR       >80% decrease in PCT            Discourage initiation of                                            antibiotics      Encourage discontinuation           of antibiotics    ----------------------------     -----------------------------         PCT >= 0.50 ng/mL  PCT 0.26 - 0.50 ng/mL               AND        <80% decrease in PCT             Encourage initiation of                                             antibiotics       Encourage continuation           of antibiotics    ----------------------------     -----------------------------        PCT >= 0.50 ng/mL                  PCT > 0.50 ng/mL               AND         increase in PCT                  Strongly encourage                                      initiation of antibiotics    Strongly encourage escalation           of antibiotics                                      -----------------------------                                           PCT <= 0.25 ng/mL                                                 OR                                        > 80% decrease in PCT                                      Discontinue / Do not initiate                                             antibiotics  Performed at [facility], [address], [place], [place] [address]   I-Stat Chem 8, ED     Status: Abnormal   Collection Time: 10/09/22 12:55 AM  Result Value Ref Range   Sodium 135 135 - 145 mmol/L   Potassium 4.7 3.5 - 5.1 mmol/L   Chloride 106 98 - 111 mmol/L   BUN 23 8 - 23 mg/dL   Creatinine, Ser 0.90 0.44 - 1.00 mg/dL  Glucose, Bld 90 70 - 99 mg/dL    Comment: Glucose reference range applies only to samples taken after fasting for at least 8 hours.   Calcium, Ion 1.05 (L) 1.15 - 1.40 mmol/L   TCO2 22 22 - 32 mmol/L   Hemoglobin 12.9 12.0 - 15.0 g/dL   HCT 38.0 36.0 - 46.0 %  CBC     Status: Abnormal   Collection Time: 10/09/22  3:50 AM  Result Value Ref Range   WBC 5.0 4.0 - 10.5 K/uL   RBC 4.14 3.87 - 5.11 MIL/uL   Hemoglobin 11.6 (L) 12.0 - 15.0 g/dL   HCT 37.3 36.0 - 46.0 %   MCV 90.1 80.0 - 100.0 fL   MCH 28.0 26.0 - 34.0 pg   MCHC 31.1 30.0 - 36.0 g/dL   RDW 14.3 11.5 - 15.5 %   Platelets 200 150 - 400 K/uL   nRBC 0.0 0.0 - 0.2 %    Comment: Performed at [facility], [place] [address] [place], [place] [address]  Protime-INR     Status: Abnormal   Collection Time: 10/09/22  3:50 AM  Result Value Ref Range   Prothrombin Time 20.8 (H) 11.4 - 15.2 seconds   INR 1.8 (H) 0.8 - 1.2    Comment: (NOTE) INR goal varies based on device and disease states. Performed at [facility], [place] [address], [place], [place] [address]   Brain natriuretic peptide     Status: Abnormal   Collection Time: 10/09/22  3:50 AM  Result Value Ref Range   B Natriuretic Peptide 512.3 (H) 0.0 - 100.0 pg/mL    Comment: Performed at [place] [address] [place],  [address]  Resp panel by RT-PCR (RSV, Flu A&B, Covid) Anterior Nasal Swab     Status: Abnormal   Collection Time: 10/09/22  4:14 AM   Specimen: Anterior Nasal Swab  Result Value Ref Range   SARS Coronavirus 2 by RT PCR NEGATIVE NEGATIVE    Comment: (NOTE) SARS-CoV-2 target nucleic acids are NOT DETECTED.  The SARS-CoV-2 RNA is generally detectable in upper respiratory specimens during the acute phase of infection. The lowest concentration of SARS-CoV-2 viral copies this assay can detect is 138 copies/mL. A negative result does not preclude SARS-Cov-2 infection and should not be used as the sole basis for treatment or other patient management decisions. A negative result may occur with  improper specimen collection/handling, submission of specimen other than nasopharyngeal swab, presence of viral mutation(s) within the areas targeted by this assay, and inadequate number of viral copies(<138 copies/mL). A negative result must be combined with clinical observations, patient history, and epidemiological information. The expected result is Negative.  Fact Sheet for Patients:  EntrepreneurPulse.com.au  Fact Sheet for Healthcare Providers:  IncredibleEmployment.be  This test is no t yet approved or cleared by the [place] FDA and  has been authorized for detection and/or diagnosis of SARS-CoV-2 by FDA under an Emergency Use Authorization (EUA). This EUA will remain  in effect (meaning this test can be used) for the duration of the COVID-19 declaration under Section 564(b)(1) of the Act, 21 U.S.C.section 360bbb-3(b)(1), unless the authorization is terminated  or revoked sooner.       Influenza A by PCR POSITIVE (A) NEGATIVE   Influenza B by PCR NEGATIVE NEGATIVE    Comment: (NOTE) The Xpert Xpress SARS-CoV-2/FLU/RSV plus assay is intended as an aid in the diagnosis of influenza from Nasopharyngeal swab  specimens  and should not be used as a sole basis for treatment. Nasal washings and aspirates are unacceptable for Xpert Xpress SARS-CoV-2/FLU/RSV testing.  Fact Sheet for Patients: EntrepreneurPulse.com.au  Fact Sheet for Healthcare Providers: IncredibleEmployment.be  This test is not yet approved or cleared by the [place] FDA and has been authorized for detection and/or diagnosis of SARS-CoV-2 by FDA under an Emergency Use Authorization (EUA). This EUA will remain in effect (meaning this test can be used) for the duration of the COVID-19 declaration under Section 564(b)(1) of the Act, 21 U.S.C. section 360bbb-3(b)(1), unless the authorization is terminated or revoked.     Resp Syncytial Virus by PCR NEGATIVE NEGATIVE    Comment: (NOTE) Fact Sheet for Patients: EntrepreneurPulse.com.au  Fact Sheet for Healthcare Providers: IncredibleEmployment.be  This test is not yet approved or cleared by the [place] FDA and has been authorized for detection and/or diagnosis of SARS-CoV-2 by FDA under an Emergency Use Authorization (EUA). This EUA will remain in effect (meaning this test can be used) for the duration of the COVID-19 declaration under Section 564(b)(1) of the Act, 21 U.S.C. section 360bbb-3(b)(1), unless the authorization is terminated or revoked.  Performed at [facility], [place] [address] [place], [place] [address]    ECHOCARDIOGRAM COMPLETE  Result Date: 10/09/2022    ECHOCARDIOGRAM REPORT   Patient Name:   Stacy Chapman Date of Exam: 10/09/2022 Medical Rec #:  [id number]            Height:       67.0 in Accession #:    [id number]           Weight:       220.0 lb Date of Birth:  December 08, 1955            BSA:          2.106 m Patient Age:    45 years             BP:           128/72 mmHg Patient Gender: F                    HR:           75 bpm. Exam Location:  Inpatient Procedure:  2D Echo and Intracardiac Opacification Agent Indications:    syncope  History:        Patient has prior history of Echocardiogram examinations, most                 recent 09/12/2019. Arrythmias:Atrial Fibrillation; Risk                 Factors:Hypertension, Diabetes and Dyslipidemia.  Sonographer:    Harvie Junior Referring Phys: [id number] [place]  1. Left ventricular ejection fraction, by estimation, is 40 to 45%. The left ventricle has mildly decreased function. The left ventricle demonstrates global hypokinesis. There is mild concentric left ventricular hypertrophy. Left ventricular diastolic parameters are indeterminate.  2. Right ventricular systolic function is normal. The right ventricular size is normal. There is normal pulmonary artery systolic pressure.  3. The mitral valve is normal in structure. Mild mitral valve regurgitation. No evidence of mitral stenosis.  4. The aortic valve is normal in structure. Aortic valve regurgitation is trivial. No aortic stenosis is present.  5. The inferior vena cava is normal in size with greater than 50% respiratory variability, suggesting right atrial pressure of 3 mmHg. FINDINGS  Left Ventricle: Left ventricular ejection  fraction, by estimation, is 40 to 45%. The left ventricle has mildly decreased function. The left ventricle demonstrates global hypokinesis. Definity contrast agent was given IV to delineate the left ventricular  endocardial borders. The left ventricular internal cavity size was normal in size. There is mild concentric left ventricular hypertrophy. Left ventricular diastolic parameters are indeterminate. Right Ventricle: The right ventricular size is normal. No increase in right ventricular wall thickness. Right ventricular systolic function is normal. There is normal pulmonary artery systolic pressure. The tricuspid regurgitant velocity is 1.95 m/s, and  with an assumed right atrial pressure of 3 mmHg, the estimated right  ventricular systolic pressure is 95.0 mmHg. Left Atrium: Left atrial size was normal in size. Right Atrium: Right atrial size was normal in size. Pericardium: There is no evidence of pericardial effusion. Mitral Valve: The mitral valve is normal in structure. Mild mitral valve regurgitation. No evidence of mitral valve stenosis. Tricuspid Valve: The tricuspid valve is normal in structure. Tricuspid valve regurgitation is not demonstrated. No evidence of tricuspid stenosis. Aortic Valve: The aortic valve is normal in structure. Aortic valve regurgitation is trivial. Aortic regurgitation PHT measures 805 msec. No aortic stenosis is present. Aortic valve mean gradient measures 5.0 mmHg. Aortic valve peak gradient measures 8.3  mmHg. Aortic valve area, by VTI measures 2.15 cm. Pulmonic Valve: The pulmonic valve was normal in structure. Pulmonic valve regurgitation is not visualized. No evidence of pulmonic stenosis. Aorta: The aortic root is normal in size and structure. Venous: The inferior vena cava is normal in size with greater than 50% respiratory variability, suggesting right atrial pressure of 3 mmHg. IAS/Shunts: No atrial level shunt detected by color flow Doppler.  LEFT VENTRICLE PLAX 2D LVIDd:         4.65 cm     Diastology LVIDs:         3.50 cm     LV e' medial:    11.30 cm/s LV PW:         1.10 cm     LV E/e' medial:  9.7 LV IVS:        0.95 cm     LV e' lateral:   10.60 cm/s LVOT diam:     1.90 cm     LV E/e' lateral: 10.4 LV SV:         45 LV SV Index:   21 LVOT Area:     2.84 cm  LV Volumes (MOD) LV vol d, MOD A2C: 90.6 ml LV vol d, MOD A4C: 85.3 ml LV vol s, MOD A2C: 47.8 ml LV vol s, MOD A4C: 47.6 ml LV SV MOD A2C:     42.8 ml LV SV MOD A4C:     85.3 ml LV SV MOD BP:      40.0 ml RIGHT VENTRICLE RV Basal diam:  3.40 cm RV Mid diam:    2.50 cm RV S prime:     10.20 cm/s TAPSE (M-mode): 1.3 cm LEFT ATRIUM           Index        RIGHT ATRIUM           Index LA diam:      3.60 cm 1.71 cm/m   RA Area:      11.00 cm LA Vol (A4C): 35.0 ml 16.62 ml/m  RA Volume:   23.60 ml  11.21 ml/m  AORTIC VALVE  PULMONIC VALVE AV Area (Vmax):    1.94 cm      PV Vmax:       1.15 m/s AV Area (Vmean):   1.91 cm      PV Peak grad:  5.3 mmHg AV Area (VTI):     2.15 cm AV Vmax:           144.00 cm/s AV Vmean:          109.000 cm/s AV VTI:            0.210 m AV Peak Grad:      8.3 mmHg AV Mean Grad:      5.0 mmHg LVOT Vmax:         98.30 cm/s LVOT Vmean:        73.300 cm/s LVOT VTI:          0.159 m LVOT/AV VTI ratio: 0.76 AI PHT:            805 msec  AORTA Ao Root diam: 2.80 cm Ao Asc diam:  2.60 cm MITRAL VALVE                TRICUSPID VALVE MV Area (PHT): 4.29 cm     TR Peak grad:   15.2 mmHg MV Decel Time: 177 msec     TR Vmax:        195.00 cm/s MR Peak grad: 84.6 mmHg MR Vmax:      460.00 cm/s   SHUNTS MV E velocity: 110.00 cm/s  Systemic VTI:  0.16 m MV A velocity: 47.60 cm/s   Systemic Diam: 1.90 cm MV E/A ratio:  2.31 Kardie Tobb DO Electronically signed by Berniece Salines DO Signature Date/Time: 10/09/2022/5:10:12 PM    Final    CT CHEST ABDOMEN PELVIS W CONTRAST  Result Date: 10/09/2022 CLINICAL DATA:  Weakness.  Lung mass EXAM: CT CHEST, ABDOMEN, AND PELVIS WITH CONTRAST TECHNIQUE: Multidetector CT imaging of the chest, abdomen and pelvis was performed following the standard protocol during bolus administration of intravenous contrast. RADIATION DOSE REDUCTION: This exam was performed according to the departmental dose-optimization program which includes automated exposure control, adjustment of the mA and/or kV according to patient size and/or use of iterative reconstruction technique. CONTRAST:  78m OMNIPAQUE IOHEXOL 350 MG/ML SOLN COMPARISON:  Radiograph 10/09/2022 FINDINGS: CT CHEST FINDINGS Cardiovascular: Normal heart size. No pericardial effusion. The main pulmonary artery is enlarged measuring 3.5 cm. No central pulmonary embolus. Mediastinum/Nodes: Calcified hilar and mediastinal nodes. The  mediastinal nodes are enlarged. For example 1.9 cm subcarinal node. Unremarkable esophagus. Lungs/Pleura: Scarring in the lower lungs. Subpleural reticular opacities and bronchiolectasis with question of early honeycombing. There are a few scattered centrilobular ground-glass micro nodules compatible with small airway infection/inflammation. No pleural effusion or pneumothorax. Musculoskeletal: No acute abnormality. CT ABDOMEN PELVIS FINDINGS Hepatobiliary: No focal hepatic lesion. Unremarkable gallbladder. No biliary dilation. Pancreas: Unremarkable. No pancreatic ductal dilatation or surrounding inflammatory changes. Spleen: Normal in size without focal abnormality. Adrenals/Urinary Tract: Unremarkable adrenal glands. Low-attenuation lesions in the kidneys are statistically likely to represent cysts. No follow-up is required. No urinary calculi or hydronephrosis. Unremarkable bladder. Stomach/Bowel: Normal caliber large and small bowel. No bowel wall thickening. Appendix is unremarkable. Stomach is decompressed. Vascular/Lymphatic: Mild aorto bi-iliac atherosclerotic calcification. No suspicious lymphadenopathy. Reproductive: Uterus and bilateral adnexa are unremarkable. Other: No free intraperitoneal fluid or air. Musculoskeletal: No acute fracture. IMPRESSION: 1. Calcified hilar and mediastinal nodes which are enlarged. This is indeterminate and may be due to prior granulomatous infection/inflammation  or treated lymphoma. 2. Question early interstitial lung disease given subpleural reticular opacities, blunt bronchiolectasis and possible early honeycombing in the lower lungs. 3. Mild small airway infection/inflammation. 4. No acute abnormality in the abdomen or pelvis. Electronically Signed   By: Placido Sou M.D.   On: 10/09/2022 04:04   CT HEAD WO CONTRAST  Result Date: 10/09/2022 CLINICAL DATA:  Trauma. EXAM: CT HEAD WITHOUT CONTRAST CT CERVICAL SPINE WITHOUT CONTRAST TECHNIQUE: Multidetector CT  imaging of the head and cervical spine was performed following the standard protocol without intravenous contrast. Multiplanar CT image reconstructions of the cervical spine were also generated. RADIATION DOSE REDUCTION: This exam was performed according to the departmental dose-optimization program which includes automated exposure control, adjustment of the mA and/or kV according to patient size and/or use of iterative reconstruction technique. COMPARISON:  None Available. FINDINGS: CT HEAD FINDINGS Brain: The ventricles and sulci appropriate size for patient's age. Mild periventricular and deep white matter chronic microvascular ischemic changes noted. There is no acute intracranial hemorrhage. No mass effect or midline shift. No extra-axial fluid collection. Vascular: None Skull: Normal. Negative for fracture or focal lesion. Sinuses/Orbits: Diffuse mucoperiosteal thickening of paranasal sinuses. No air-fluid level. The mastoid air cells are clear. Other: None CT CERVICAL SPINE FINDINGS Alignment: No acute subluxation. Skull base and vertebrae: No acute fracture. Soft tissues and spinal canal: No prevertebral fluid or swelling. No visible canal hematoma. Disc levels:  No acute findings.  Degenerative changes. Upper chest: A 6 mm right apical nodule or a vascular confluence (93/4 and 17/9). Dedicated chest CT recommended for better evaluation of the lungs on a nonemergent/outpatient basis. Other: Bilateral carotid bulb calcified plaques. There is a 3.0 x 2.5 cm soft tissue density in the medial apical pleural surface. Although this may represent vascular confluence a mass is not excluded. IMPRESSION: 1. No acute intracranial pathology. 2. Mild chronic microvascular ischemic changes. 3. No acute/traumatic cervical spine pathology. 4. A 6 mm right apical nodule or a vascular confluence. Additionally there is a right apical soft tissue mass versus confluence of vessels. Dedicated chest CT with IV contrast is  recommended for better evaluation of the lungs. This can be performed on a nonemergent/outpatient basis. Electronically Signed   By: Anner Crete M.D.   On: 10/09/2022 02:13   CT CERVICAL SPINE WO CONTRAST  Result Date: 10/09/2022 CLINICAL DATA:  Trauma. EXAM: CT HEAD WITHOUT CONTRAST CT CERVICAL SPINE WITHOUT CONTRAST TECHNIQUE: Multidetector CT imaging of the head and cervical spine was performed following the standard protocol without intravenous contrast. Multiplanar CT image reconstructions of the cervical spine were also generated. RADIATION DOSE REDUCTION: This exam was performed according to the departmental dose-optimization program which includes automated exposure control, adjustment of the mA and/or kV according to patient size and/or use of iterative reconstruction technique. COMPARISON:  None Available. FINDINGS: CT HEAD FINDINGS Brain: The ventricles and sulci appropriate size for patient's age. Mild periventricular and deep white matter chronic microvascular ischemic changes noted. There is no acute intracranial hemorrhage. No mass effect or midline shift. No extra-axial fluid collection. Vascular: None Skull: Normal. Negative for fracture or focal lesion. Sinuses/Orbits: Diffuse mucoperiosteal thickening of paranasal sinuses. No air-fluid level. The mastoid air cells are clear. Other: None CT CERVICAL SPINE FINDINGS Alignment: No acute subluxation. Skull base and vertebrae: No acute fracture. Soft tissues and spinal canal: No prevertebral fluid or swelling. No visible canal hematoma. Disc levels:  No acute findings.  Degenerative changes. Upper chest: A 6 mm right apical nodule  or a vascular confluence (93/4 and 17/9). Dedicated chest CT recommended for better evaluation of the lungs on a nonemergent/outpatient basis. Other: Bilateral carotid bulb calcified plaques. There is a 3.0 x 2.5 cm soft tissue density in the medial apical pleural surface. Although this may represent vascular  confluence a mass is not excluded. IMPRESSION: 1. No acute intracranial pathology. 2. Mild chronic microvascular ischemic changes. 3. No acute/traumatic cervical spine pathology. 4. A 6 mm right apical nodule or a vascular confluence. Additionally there is a right apical soft tissue mass versus confluence of vessels. Dedicated chest CT with IV contrast is recommended for better evaluation of the lungs. This can be performed on a nonemergent/outpatient basis. Electronically Signed   By: Anner Crete M.D.   On: 10/09/2022 02:13   DG Chest Port 1 View  Result Date: 10/09/2022 CLINICAL DATA:  Level 2 fall on blood thinners EXAM: PORTABLE CHEST 1 VIEW COMPARISON:  08/21/2019 FINDINGS: Stable cardiomegaly. No pneumothorax. No definite pleural effusion. No focal consolidation. No definite displaced rib fractures. Telemetry leads overlie the chest. IMPRESSION: No definite acute abnormality.  Cardiomegaly. Electronically Signed   By: Placido Sou M.D.   On: 10/09/2022 01:22    Pending Labs Unresulted Labs (From admission, onward)     Start     Ordered   10/10/22 0500  Protime-INR  Daily,   R      10/09/22 0441   10/10/22 2355  Basic metabolic panel  Daily,   R      10/09/22 0736   10/10/22 0500  CBC  Daily,   R      10/09/22 0736   10/10/22 0500  Magnesium  Daily,   R      10/09/22 0736   10/10/22 0500  Heparin level (unfractionated)  Daily,   R     See Hyperspace for full Linked Orders Report.   10/09/22 1606   10/10/22 0500  CBC  Daily,   R     See Hyperspace for full Linked Orders Report.   10/09/22 1606   10/09/22 2300  Heparin level (unfractionated)  Once-Timed,   TIMED        10/09/22 1608   10/09/22 0432  Gastrointestinal Panel by PCR , Stool  (Gastrointestinal Panel by PCR, Stool                                                                                                                                                     **Does Not include CLOSTRIDIUM DIFFICILE testing. **If  CDIFF testing is needed, place order from the "C Difficile Testing" order set.**)  Once,   R        10/09/22 0431   10/09/22 0428  HIV Antibody (routine testing w rflx)  (HIV Antibody (Routine testing w reflex) panel)  Once,   R  10/09/22 0429   10/09/22 0040  Sample to Blood Bank  (Trauma Panel)  Once,   URGENT        10/09/22 0040            Vitals/Pain Today's Vitals   10/09/22 1430 10/09/22 1445 10/09/22 1500 10/09/22 1521  BP: 117/72 129/70 (!) 103/47   Pulse: 80 83 88   Resp: (!) 21 (!) 23 17   Temp:    99 F (37.2 C)  TempSrc:    Oral  SpO2: 100% 99% 99%   Weight:      Height:      PainSc:        Isolation Precautions Droplet precaution  Medications Medications  acetaminophen (TYLENOL) tablet 650 mg (650 mg Oral Given 10/09/22 1016)  ondansetron (ZOFRAN) injection 4 mg (has no administration in time range)  flecainide (TAMBOCOR) tablet 75 mg (75 mg Oral Given 10/09/22 0913)  diltiazem (CARDIZEM CD) 24 hr capsule 120 mg (120 mg Oral Given 10/09/22 0913)  Warfarin - Pharmacist Dosing Inpatient (has no administration in time range)  warfarin (COUMADIN) tablet 10 mg (has no administration in time range)  oseltamivir (TAMIFLU) capsule 75 mg (75 mg Oral Given 10/09/22 0913)  ipratropium-albuterol (DUONEB) 0.5-2.5 (3) MG/3ML nebulizer solution 3 mL (has no administration in time range)  ipratropium-albuterol (DUONEB) 0.5-2.5 (3) MG/3ML nebulizer solution 3 mL (3 mLs Nebulization Given 10/09/22 0916)  hydrALAZINE (APRESOLINE) injection 10 mg (has no administration in time range)  metoprolol tartrate (LOPRESSOR) injection 5 mg (has no administration in time range)  oxyCODONE (Oxy IR/ROXICODONE) immediate release tablet 5 mg (has no administration in time range)  traZODone (DESYREL) tablet 50 mg (has no administration in time range)  senna-docusate (Senokot-S) tablet 1 tablet (has no administration in time range)  guaiFENesin (ROBITUSSIN) 100 MG/5ML liquid 5 mL (has  no administration in time range)  heparin ADULT infusion 100 units/mL (25000 units/255m) (1,200 Units/hr Intravenous New Bag/Given 10/09/22 1727)  perflutren lipid microspheres (DEFINITY) IV suspension (2 mLs Intravenous Given 10/09/22 1633)  sodium chloride 0.9 % bolus 1,000 mL (0 mLs Intravenous Stopped 10/09/22 0426)  iohexol (OMNIPAQUE) 350 MG/ML injection 75 mL (75 mLs Intravenous Contrast Given 10/09/22 0320)  calcium gluconate 2 g/ 100 mL sodium chloride IVPB (0 mg Intravenous Stopped 10/09/22 1200)    Mobility walks Low fall risk   Focused Assessments Cardiac Assessment Handoff:  Cardiac Rhythm: Atrial fibrillation No results found for: "CKTOTAL", "CKMB", "CKMBINDEX", "TROPONINI" No results found for: "DDIMER" Does the Patient currently have chest pain? No    R Recommendations: See Admitting Provider Note  Report given to:   Additional Notes:

## 2022-10-09 NOTE — Assessment & Plan Note (Addendum)
Dehydration likely caused syncope as well. Presumably viral given daughter having similar symptoms over past couple of days. GI pathogen pnl.

## 2022-10-09 NOTE — ED Notes (Signed)
Trauma Response Nurse Documentation   Stacy Chapman is a 66 y.o. female arriving to [facility] ED via EMS  On warfarin daily. Trauma was activated as a Level 2 by ED charge RN based on the following trauma criteria Elderly patients > 65 with head trauma on anti-coagulation (excluding ASA). Trauma team at the bedside on patient arrival.   Patient cleared for CT by Dr. Leonette Monarch EDP. Pt transported to CT with trauma response nurse present to monitor. RN remained with the patient throughout their absence from the department for clinical observation.   GCS 15.  History   Past Medical History:  Diagnosis Date   Anemia    Arthritis    Asthma    Diabetes ([place])    Essential hypertension    Morbid obesity ([place])    Persistent atrial fibrillation ([place])    Stroke ([place]) 2018   TIA     Past Surgical History:  Procedure Laterality Date   KNEE ARTHROSCOPY     TONSILLECTOMY     TUBAL LIGATION  1991       Initial Focused Assessment (If applicable, or please see trauma documentation): Alert/oriented female presents via EMS after a fall with head injury, no obvious external trauma noted on assessment, in AFIB with RVR Airway patent/unobstructed, BS clear nonproductive cough No obvious uncontrolled hemorrhage GCS 15 PERRLA 4  CT's Completed:   CT Head and CT C-Spine   Interventions:  Trauma lab draw Portable chest XRAY CT head and cervical spine EKG  Plan for disposition:  Pending workup, anticipate admit for AFIB  Consults completed:  none at the time of this note.  Event Summary: Presents after a fall with head injury, no obvious trauma noted. States she's been sick with diarrhea, cough. Escorted to CT. POC pending workup.   Bedside handoff with ED RN Joya.    Stacy Chapman  Trauma Response RN  Please call TRN at [phone] for further assistance.

## 2022-10-09 NOTE — Progress Notes (Signed)
ANTICOAGULATION CONSULT NOTE  Pharmacy Consult for Warfarin + heparin Indication: atrial fibrillation  Allergies  Allergen Reactions   Lisinopril Cough    Patient Measurements: Height: '5\' 7"'$  (170.2 cm) Weight: 99.8 kg (220 lb) IBW/kg (Calculated) : 61.6  Vital Signs: Temp: 99 F (37.2 C) (12/28 1521) Temp Source: Oral (12/28 1521) BP: 103/47 (12/28 1500) Pulse Rate: 88 (12/28 1500)  Labs: Recent Labs    10/09/22 0040 10/09/22 0055 10/09/22 0350  HGB  --  12.9 11.6*  HCT  --  38.0 37.3  PLT  --   --  200  LABPROT  --   --  20.8*  INR  --   --  1.8*  CREATININE 1.04* 0.90  --      Estimated Creatinine Clearance: 74.6 mL/min (by C-G formula based on SCr of 0.9 mg/dL).   Medical History: Past Medical History:  Diagnosis Date   Anemia    Arthritis    Asthma    Diabetes ([place])    Essential hypertension    Morbid obesity ([place])    Persistent atrial fibrillation ([place])    Stroke ([place]) 2018   TIA     Assessment: 66 y/o F in the ED s/p fall, on warfarin PTA for afib, CT imaging is negative for any bleeding, continuing warfarin, INR is sub-therapeutic at 1.8  Warfarin PTA dosing: 10 mg on Sunday and Thursday, 5 mg all other days  Warfarin continued on admission, now to add IV heparin to cover until INR therapeutic per cards  Goal of Therapy:  INR 2-3 Monitor platelets by anticoagulation protocol: Yes   Plan:  Heparin gtt at 1200 units/hr F/u 6 hour heparin level F/u INR with AM labs for heparin LOT  Bertis Ruddy, PharmD, [place] Pharmacist ED Pharmacist Phone # [phone] 10/09/2022 4:05 PM

## 2022-10-09 NOTE — Assessment & Plan Note (Signed)
>>  ASSESSMENT AND PLAN FOR ATRIAL FIBRILLATION WITH RVR (HCC) WRITTEN ON 10/09/2022  4:34 AM BY GARDNER, JARED M, DO  A.Fib pathway Cont coumadin  Looks like been on coumadin  for a long time Not clear to me though why she hasn't been switched over to DOAC? Cardizem  gtt Cont home PO cardizem  as well in AM Cont tambocor  Tele monitor

## 2022-10-09 NOTE — Assessment & Plan Note (Addendum)
A.Fib pathway Cont coumadin Looks like been on coumadin for a long time Not clear to me though why she hasn't been switched over to [place]? Cardizem gtt Cont home PO cardizem as well in AM Cont tambocor Tele monitor

## 2022-10-09 NOTE — Assessment & Plan Note (Addendum)
Question of early ILD. Consider curbsiding pulm in AM, dosent sound like this is symptomatic at this point, may just want to follow up outpt.

## 2022-10-09 NOTE — Plan of Care (Signed)

## 2022-10-09 NOTE — Assessment & Plan Note (Signed)
Looks to be diet controlled A1C 5.9 x3 months ago.

## 2022-10-10 DIAGNOSIS — I4891 Unspecified atrial fibrillation: Secondary | ICD-10-CM | POA: Diagnosis not present

## 2022-10-10 DIAGNOSIS — Z1152 Encounter for screening for COVID-19: Secondary | ICD-10-CM | POA: Diagnosis not present

## 2022-10-10 DIAGNOSIS — Z7901 Long term (current) use of anticoagulants: Secondary | ICD-10-CM | POA: Diagnosis not present

## 2022-10-10 DIAGNOSIS — R55 Syncope and collapse: Secondary | ICD-10-CM | POA: Diagnosis not present

## 2022-10-10 DIAGNOSIS — Z8673 Personal history of transient ischemic attack (TIA), and cerebral infarction without residual deficits: Secondary | ICD-10-CM | POA: Diagnosis not present

## 2022-10-10 DIAGNOSIS — I4819 Other persistent atrial fibrillation: Secondary | ICD-10-CM | POA: Diagnosis not present

## 2022-10-10 DIAGNOSIS — S0990XA Unspecified injury of head, initial encounter: Secondary | ICD-10-CM | POA: Diagnosis not present

## 2022-10-10 DIAGNOSIS — I5020 Unspecified systolic (congestive) heart failure: Secondary | ICD-10-CM | POA: Diagnosis not present

## 2022-10-10 DIAGNOSIS — J45909 Unspecified asthma, uncomplicated: Secondary | ICD-10-CM | POA: Diagnosis not present

## 2022-10-10 DIAGNOSIS — X58XXXA Exposure to other specified factors, initial encounter: Secondary | ICD-10-CM | POA: Diagnosis not present

## 2022-10-10 DIAGNOSIS — I10 Essential (primary) hypertension: Secondary | ICD-10-CM | POA: Diagnosis not present

## 2022-10-10 DIAGNOSIS — J101 Influenza due to other identified influenza virus with other respiratory manifestations: Secondary | ICD-10-CM | POA: Diagnosis not present

## 2022-10-10 DIAGNOSIS — E119 Type 2 diabetes mellitus without complications: Secondary | ICD-10-CM | POA: Diagnosis not present

## 2022-10-10 DIAGNOSIS — R918 Other nonspecific abnormal finding of lung field: Secondary | ICD-10-CM | POA: Diagnosis not present

## 2022-10-10 DIAGNOSIS — Z79899 Other long term (current) drug therapy: Secondary | ICD-10-CM | POA: Diagnosis not present

## 2022-10-10 LAB — CBC
HCT: 32.9 % — ABNORMAL LOW (ref 36.0–46.0)
Hemoglobin: 10.7 g/dL — ABNORMAL LOW (ref 12.0–15.0)
MCH: 28.6 pg (ref 26.0–34.0)
MCHC: 32.5 g/dL (ref 30.0–36.0)
MCV: 88 fL (ref 80.0–100.0)
Platelets: 172 10*3/uL (ref 150–400)
RBC: 3.74 MIL/uL — ABNORMAL LOW (ref 3.87–5.11)
RDW: 14.5 % (ref 11.5–15.5)
WBC: 4.6 10*3/uL (ref 4.0–10.5)
nRBC: 0 % (ref 0.0–0.2)

## 2022-10-10 LAB — BASIC METABOLIC PANEL
Anion gap: 6 (ref 5–15)
BUN: 19 mg/dL (ref 8–23)
CO2: 21 mmol/L — ABNORMAL LOW (ref 22–32)
Calcium: 8.1 mg/dL — ABNORMAL LOW (ref 8.9–10.3)
Chloride: 107 mmol/L (ref 98–111)
Creatinine, Ser: 1.67 mg/dL — ABNORMAL HIGH (ref 0.44–1.00)
GFR, Estimated: 34 mL/min — ABNORMAL LOW (ref >60)
Glucose, Bld: 98 mg/dL (ref 70–99)
Potassium: 4.1 mmol/L (ref 3.5–5.1)
Sodium: 134 mmol/L — ABNORMAL LOW (ref 135–145)

## 2022-10-10 LAB — HEPARIN LEVEL (UNFRACTIONATED)
Heparin Unfractionated: >1.1 IU/mL — ABNORMAL HIGH (ref 0.30–0.70)
Heparin Unfractionated: >1.1 IU/mL — ABNORMAL HIGH (ref 0.30–0.70)
Heparin Unfractionated: 0.82 IU/mL — ABNORMAL HIGH (ref 0.30–0.70)

## 2022-10-10 LAB — PROTIME-INR
INR: 1.4 — ABNORMAL HIGH (ref 0.8–1.2)
Prothrombin Time: 16.7 seconds — ABNORMAL HIGH (ref 11.4–15.2)

## 2022-10-10 LAB — MAGNESIUM: Magnesium: 1.7 mg/dL (ref 1.7–2.4)

## 2022-10-10 MED ORDER — HEPARIN (PORCINE) 25000 UT/250ML-% IV SOLN: 750.0000 [IU]/h | INTRAVENOUS | Status: DC

## 2022-10-10 MED ORDER — LEVALBUTEROL HCL 0.63 MG/3ML IN NEBU: 0.6300 mg | INHALATION_SOLUTION | Freq: Two times a day (BID) | RESPIRATORY_TRACT | Status: DC

## 2022-10-10 MED ORDER — OSELTAMIVIR PHOSPHATE 30 MG PO CAPS
30.0000 mg | ORAL_CAPSULE | Freq: Two times a day (BID) | ORAL | Status: DC
  Administered 2022-10-10 – 2022-10-11 (×2): 30 mg via ORAL
  Filled 2022-10-10 (×3): qty 1

## 2022-10-10 MED ORDER — IPRATROPIUM BROMIDE 0.02 % IN SOLN: 0.5000 mg | Freq: Two times a day (BID) | RESPIRATORY_TRACT | Status: DC

## 2022-10-10 MED ORDER — WARFARIN SODIUM 7.5 MG PO TABS
7.5000 mg | ORAL_TABLET | Freq: Once | ORAL | Status: AC
  Administered 2022-10-10: 7.5 mg via ORAL
  Filled 2022-10-10: qty 1

## 2022-10-10 NOTE — Progress Notes (Signed)
PHARMACY NOTE:  ANTIMICROBIAL RENAL DOSAGE ADJUSTMENT  Current antimicrobial regimen includes a mismatch between antimicrobial dosage and estimated renal function.  As per policy approved by the Pharmacy & Therapeutics and Medical Executive Committees, the antimicrobial dosage will be adjusted accordingly.  Current antimicrobial dosage:  oseltamivir '75mg'$  BID  Indication: flu  Renal Function:  Estimated Creatinine Clearance: 40.2 mL/min (A) (by C-G formula based on SCr of 1.67 mg/dL (H)). '[]'$      On intermittent HD, scheduled: '[]'$      On CRRT    Antimicrobial dosage has been changed to:  '30mg'$  BID  Additional comments:   Thank you for allowing pharmacy to be a part of this patient's care.  Einar Grad, [facility] 10/10/2022 11:25 AM

## 2022-10-10 NOTE — Progress Notes (Signed)
PROGRESS NOTE    Stacy Chapman  VOJ:[id number] DOB: December 10, 1955 DOA: 10/09/2022 PCP: [name], NP   Brief Narrative:  66 year old with history of A-fib, prior TIA on Coumadin admitted to the hospital for syncopal episode and severe diarrhea.  Patient was found to be in atrial fibrillation with RVR, CT of the chest abdomen pelvis showed possible early interstitial lung disease otherwise no other acute intra-abdominal pathology.  Lab work was overall unremarkable.  Patient was started on Cardizem drip along with home p.o. medication.  Patient's flu swab came back positive therefore started on Tamiflu.  Cardiology team consulted for difficult to manage atrial fibrillation.   Assessment & Plan:  Principal Problem:   Atrial fibrillation with RVR (HCC) Active Problems:   Diarrhea of presumed infectious origin   DM (diabetes mellitus) ([place])   Hyperlipidemia   Abnormal CT scan of lung     Assessment and Plan: * Atrial fibrillation with RVR ([place]) - Induced by underlying pulmonary/fluid infection.  Has not really been to get her medications at home.  Currently p.o. Cardizem and flecainide has been initiated.  Heparin to Coumadin bridge, goal INR 2.0-3.0.  Cardiology following.  Echocardiogram shows EF of 45% with global hypokinesia  Congestive heart failure with reduced ejection fraction, 45% - Echocardiogram in 2020 showed EF 60%, this admission it is 45% with global hypokinesia.  Cardiology following  Diarrhea of presumed infectious origin Likely viral in nature.  Influenza positive.  GI panel ordered  Influenza A infection - On Tamiflu.  Supportive care  Hypocalcemia - Repletion  Abnormal CT scan of lung -Has some interstitial lung disease.  This is seen on the CAT scan.  Will check procalcitonin, BNP.  Supportive care, bronchodilator.  Will rec and follow-up outpatient pulmonary  Hyperlipidemia Statin  DM type II (diabetes mellitus) ([place]) Diet  controlled    DVT prophylaxis: On Coumadin Code Status: Full code Family Communication: Daughter at bedside Ongoing management for atrial fibrillation with RVR, and CHF.  Subjective: Still having multiple episodes of tachycardia and pauses.   Examination: Constitutional: Not in acute distress Respiratory: b/l rhonchi Cardiovascular: Normal sinus rhythm, no rubs Abdomen: Nontender nondistended good bowel sounds Musculoskeletal: No edema noted Skin: No rashes seen Neurologic: CN 2-12 grossly intact.  And nonfocal Psychiatric: Normal judgment and insight. Alert and oriented x 3. Normal mood.    Objective: Vitals:   10/09/22 2300 10/10/22 0024 10/10/22 0449 10/10/22 0738  BP:  (!) 98/44 (!) 106/50   Pulse:  74 70   Resp:  18 16   Temp:  99 F (37.2 C) 97.7 F (36.5 C)   TempSrc:  Oral Oral   SpO2: 95% 95% 95% 97%  Weight:      Height:       No intake or output data in the 24 hours ending 10/10/22 0816 Filed Weights   10/09/22 0049  Weight: 99.8 kg     Data Reviewed:   CBC: Recent Labs  Lab 10/09/22 0055 10/09/22 0350 10/10/22 0723  WBC  --  5.0 4.6  HGB 12.9 11.6* 10.7*  HCT 38.0 37.3 32.9*  MCV  --  90.1 88.0  PLT  --  200 993   Basic Metabolic Panel: Recent Labs  Lab 10/09/22 0040 10/09/22 0055  NA 133* 135  K 4.6 4.7  CL 106 106  CO2 23  --   GLUCOSE 89 90  BUN 18 23  CREATININE 1.04* 0.90  CALCIUM 8.8*  --    GFR: Estimated  Creatinine Clearance: 74.6 mL/min (by C-G formula based on SCr of 0.9 mg/dL). Liver Function Tests: Recent Labs  Lab 10/09/22 0040  AST 27  ALT 18  ALKPHOS 37*  BILITOT 0.8  PROT 7.3  ALBUMIN 3.1*   No results for input(s): "LIPASE", "AMYLASE" in the last 168 hours. No results for input(s): "AMMONIA" in the last 168 hours. Coagulation Profile: Recent Labs  Lab 10/09/22 0350  INR 1.8*   Cardiac Enzymes: No results for input(s): "CKTOTAL", "CKMB", "CKMBINDEX", "TROPONINI" in the last 168 hours. BNP (last  3 results) No results for input(s): "PROBNP" in the last 8760 hours. HbA1C: No results for input(s): "HGBA1C" in the last 72 hours. CBG: No results for input(s): "GLUCAP" in the last 168 hours. Lipid Profile: No results for input(s): "CHOL", "HDL", "LDLCALC", "TRIG", "CHOLHDL", "LDLDIRECT" in the last 72 hours. Thyroid Function Tests: No results for input(s): "TSH", "T4TOTAL", "FREET4", "T3FREE", "THYROIDAB" in the last 72 hours. Anemia Panel: No results for input(s): "VITAMINB12", "FOLATE", "FERRITIN", "TIBC", "IRON", "RETICCTPCT" in the last 72 hours. Sepsis Labs: Recent Labs  Lab 10/09/22 0050  PROCALCITON <0.10    Recent Results (from the past 240 hour(s))  Resp panel by RT-PCR (RSV, Flu A&B, Covid) Anterior Nasal Swab     Status: Abnormal   Collection Time: 10/09/22  4:14 AM   Specimen: Anterior Nasal Swab  Result Value Ref Range Status   SARS Coronavirus 2 by RT PCR NEGATIVE NEGATIVE Final    Comment: (NOTE) SARS-CoV-2 target nucleic acids are NOT DETECTED.  The SARS-CoV-2 RNA is generally detectable in upper respiratory specimens during the acute phase of infection. The lowest concentration of SARS-CoV-2 viral copies this assay can detect is 138 copies/mL. A negative result does not preclude SARS-Cov-2 infection and should not be used as the sole basis for treatment or other patient management decisions. A negative result may occur with  improper specimen collection/handling, submission of specimen other than nasopharyngeal swab, presence of viral mutation(s) within the areas targeted by this assay, and inadequate number of viral copies(<138 copies/mL). A negative result must be combined with clinical observations, patient history, and epidemiological information. The expected result is Negative.  Fact Sheet for Patients:  EntrepreneurPulse.com.au  Fact Sheet for Healthcare Providers:  IncredibleEmployment.be  This test is no t  yet approved or cleared by the [place] FDA and  has been authorized for detection and/or diagnosis of SARS-CoV-2 by FDA under an Emergency Use Authorization (EUA). This EUA will remain  in effect (meaning this test can be used) for the duration of the COVID-19 declaration under Section 564(b)(1) of the Act, 21 U.S.C.section 360bbb-3(b)(1), unless the authorization is terminated  or revoked sooner.       Influenza A by PCR POSITIVE (A) NEGATIVE Final   Influenza B by PCR NEGATIVE NEGATIVE Final    Comment: (NOTE) The Xpert Xpress SARS-CoV-2/FLU/RSV plus assay is intended as an aid in the diagnosis of influenza from Nasopharyngeal swab specimens and should not be used as a sole basis for treatment. Nasal washings and aspirates are unacceptable for Xpert Xpress SARS-CoV-2/FLU/RSV testing.  Fact Sheet for Patients: EntrepreneurPulse.com.au  Fact Sheet for Healthcare Providers: IncredibleEmployment.be  This test is not yet approved or cleared by the [place] FDA and has been authorized for detection and/or diagnosis of SARS-CoV-2 by FDA under an Emergency Use Authorization (EUA). This EUA will remain in effect (meaning this test can be used) for the duration of the COVID-19 declaration under Section 564(b)(1) of the Act, 21 U.S.C.  section 360bbb-3(b)(1), unless the authorization is terminated or revoked.     Resp Syncytial Virus by PCR NEGATIVE NEGATIVE Final    Comment: (NOTE) Fact Sheet for Patients: EntrepreneurPulse.com.au  Fact Sheet for Healthcare Providers: IncredibleEmployment.be  This test is not yet approved or cleared by the [place] FDA and has been authorized for detection and/or diagnosis of SARS-CoV-2 by FDA under an Emergency Use Authorization (EUA). This EUA will remain in effect (meaning this test can be used) for the duration of the COVID-19 declaration under Section  564(b)(1) of the Act, 21 U.S.C. section 360bbb-3(b)(1), unless the authorization is terminated or revoked.  Performed at [facility], [place] [address] [place], [place] [address]          Radiology Studies: CT CHEST ABDOMEN PELVIS W CONTRAST  Addendum Date: 10/10/2022   ADDENDUM REPORT: 10/10/2022 04:12 ADDENDUM: Addendum to add additional findings upon further review. 2.4 x 2.1 cm soft tissue nodule along the posterior right trachea (3/10) is indeterminate and may be related to the mediastinal adenopathy however further evaluation with MRI with and without contrast could be considered. 1.0 cm nodule in the right upper lobe (measured on coronal image). Consider one of the following in 3 months for both low-risk and high-risk individuals: (a) repeat chest CT, (b) follow-up PET-CT, or (c) tissue sampling. This recommendation follows the consensus statement: Guidelines for Management of Incidental Pulmonary Nodules Detected on CT Images: From the Fleischner Society 2017; Radiology 2017; 284:228-243. Addended results will be called to the ordering clinician or representative by the Radiologist Assistant, and communication documented in the PACS or [employer]. Electronically Signed   By: Placido Sou M.D.   On: 10/10/2022 04:12   Result Date: 10/10/2022 CLINICAL DATA:  Weakness.  Lung mass EXAM: CT CHEST, ABDOMEN, AND PELVIS WITH CONTRAST TECHNIQUE: Multidetector CT imaging of the chest, abdomen and pelvis was performed following the standard protocol during bolus administration of intravenous contrast. RADIATION DOSE REDUCTION: This exam was performed according to the departmental dose-optimization program which includes automated exposure control, adjustment of the mA and/or kV according to patient size and/or use of iterative reconstruction technique. CONTRAST:  58m OMNIPAQUE IOHEXOL 350 MG/ML SOLN COMPARISON:  Radiograph 10/09/2022 FINDINGS: CT CHEST FINDINGS Cardiovascular: Normal  heart size. No pericardial effusion. The main pulmonary artery is enlarged measuring 3.5 cm. No central pulmonary embolus. Mediastinum/Nodes: Calcified hilar and mediastinal nodes. The mediastinal nodes are enlarged. For example 1.9 cm subcarinal node. Unremarkable esophagus. Lungs/Pleura: Scarring in the lower lungs. Subpleural reticular opacities and bronchiolectasis with question of early honeycombing. There are a few scattered centrilobular ground-glass micro nodules compatible with small airway infection/inflammation. No pleural effusion or pneumothorax. Musculoskeletal: No acute abnormality. CT ABDOMEN PELVIS FINDINGS Hepatobiliary: No focal hepatic lesion. Unremarkable gallbladder. No biliary dilation. Pancreas: Unremarkable. No pancreatic ductal dilatation or surrounding inflammatory changes. Spleen: Normal in size without focal abnormality. Adrenals/Urinary Tract: Unremarkable adrenal glands. Low-attenuation lesions in the kidneys are statistically likely to represent cysts. No follow-up is required. No urinary calculi or hydronephrosis. Unremarkable bladder. Stomach/Bowel: Normal caliber large and small bowel. No bowel wall thickening. Appendix is unremarkable. Stomach is decompressed. Vascular/Lymphatic: Mild aorto bi-iliac atherosclerotic calcification. No suspicious lymphadenopathy. Reproductive: Uterus and bilateral adnexa are unremarkable. Other: No free intraperitoneal fluid or air. Musculoskeletal: No acute fracture. IMPRESSION: 1. Calcified hilar and mediastinal nodes which are enlarged. This is indeterminate and may be due to prior granulomatous infection/inflammation or treated lymphoma. 2. Question early interstitial lung disease given subpleural reticular opacities, blunt  bronchiolectasis and possible early honeycombing in the lower lungs. 3. Mild small airway infection/inflammation. 4. No acute abnormality in the abdomen or pelvis. Electronically Signed: By: Placido Sou M.D. On: 10/09/2022  04:04   ECHOCARDIOGRAM COMPLETE  Result Date: 10/09/2022    ECHOCARDIOGRAM REPORT   Patient Name:   Stacy Chapman Date of Exam: 10/09/2022 Medical Rec #:  [id number]            Height:       67.0 in Accession #:    [id number]           Weight:       220.0 lb Date of Birth:  08-30-1956            BSA:          2.106 m Patient Age:    43 years             BP:           128/72 mmHg Patient Gender: F                    HR:           75 bpm. Exam Location:  Inpatient Procedure: 2D Echo and Intracardiac Opacification Agent Indications:    syncope  History:        Patient has prior history of Echocardiogram examinations, most                 recent 09/12/2019. Arrythmias:Atrial Fibrillation; Risk                 Factors:Hypertension, Diabetes and Dyslipidemia.  Sonographer:    Harvie Junior Referring Phys: [id number] [place]  1. Left ventricular ejection fraction, by estimation, is 40 to 45%. The left ventricle has mildly decreased function. The left ventricle demonstrates global hypokinesis. There is mild concentric left ventricular hypertrophy. Left ventricular diastolic parameters are indeterminate.  2. Right ventricular systolic function is normal. The right ventricular size is normal. There is normal pulmonary artery systolic pressure.  3. The mitral valve is normal in structure. Mild mitral valve regurgitation. No evidence of mitral stenosis.  4. The aortic valve is normal in structure. Aortic valve regurgitation is trivial. No aortic stenosis is present.  5. The inferior vena cava is normal in size with greater than 50% respiratory variability, suggesting right atrial pressure of 3 mmHg. FINDINGS  Left Ventricle: Left ventricular ejection fraction, by estimation, is 40 to 45%. The left ventricle has mildly decreased function. The left ventricle demonstrates global hypokinesis. Definity contrast agent was given IV to delineate the left ventricular  endocardial borders. The left  ventricular internal cavity size was normal in size. There is mild concentric left ventricular hypertrophy. Left ventricular diastolic parameters are indeterminate. Right Ventricle: The right ventricular size is normal. No increase in right ventricular wall thickness. Right ventricular systolic function is normal. There is normal pulmonary artery systolic pressure. The tricuspid regurgitant velocity is 1.95 m/s, and  with an assumed right atrial pressure of 3 mmHg, the estimated right ventricular systolic pressure is 67.1 mmHg. Left Atrium: Left atrial size was normal in size. Right Atrium: Right atrial size was normal in size. Pericardium: There is no evidence of pericardial effusion. Mitral Valve: The mitral valve is normal in structure. Mild mitral valve regurgitation. No evidence of mitral valve stenosis. Tricuspid Valve: The tricuspid valve is normal in structure. Tricuspid valve regurgitation is not demonstrated. No evidence of tricuspid stenosis. Aortic Valve: The  aortic valve is normal in structure. Aortic valve regurgitation is trivial. Aortic regurgitation PHT measures 805 msec. No aortic stenosis is present. Aortic valve mean gradient measures 5.0 mmHg. Aortic valve peak gradient measures 8.3  mmHg. Aortic valve area, by VTI measures 2.15 cm. Pulmonic Valve: The pulmonic valve was normal in structure. Pulmonic valve regurgitation is not visualized. No evidence of pulmonic stenosis. Aorta: The aortic root is normal in size and structure. Venous: The inferior vena cava is normal in size with greater than 50% respiratory variability, suggesting right atrial pressure of 3 mmHg. IAS/Shunts: No atrial level shunt detected by color flow Doppler.  LEFT VENTRICLE PLAX 2D LVIDd:         4.65 cm     Diastology LVIDs:         3.50 cm     LV e' medial:    11.30 cm/s LV PW:         1.10 cm     LV E/e' medial:  9.7 LV IVS:        0.95 cm     LV e' lateral:   10.60 cm/s LVOT diam:     1.90 cm     LV E/e' lateral: 10.4  LV SV:         45 LV SV Index:   21 LVOT Area:     2.84 cm  LV Volumes (MOD) LV vol d, MOD A2C: 90.6 ml LV vol d, MOD A4C: 85.3 ml LV vol s, MOD A2C: 47.8 ml LV vol s, MOD A4C: 47.6 ml LV SV MOD A2C:     42.8 ml LV SV MOD A4C:     85.3 ml LV SV MOD BP:      40.0 ml RIGHT VENTRICLE RV Basal diam:  3.40 cm RV Mid diam:    2.50 cm RV S prime:     10.20 cm/s TAPSE (M-mode): 1.3 cm LEFT ATRIUM           Index        RIGHT ATRIUM           Index LA diam:      3.60 cm 1.71 cm/m   RA Area:     11.00 cm LA Vol (A4C): 35.0 ml 16.62 ml/m  RA Volume:   23.60 ml  11.21 ml/m  AORTIC VALVE                     PULMONIC VALVE AV Area (Vmax):    1.94 cm      PV Vmax:       1.15 m/s AV Area (Vmean):   1.91 cm      PV Peak grad:  5.3 mmHg AV Area (VTI):     2.15 cm AV Vmax:           144.00 cm/s AV Vmean:          109.000 cm/s AV VTI:            0.210 m AV Peak Grad:      8.3 mmHg AV Mean Grad:      5.0 mmHg LVOT Vmax:         98.30 cm/s LVOT Vmean:        73.300 cm/s LVOT VTI:          0.159 m LVOT/AV VTI ratio: 0.76 AI PHT:            805 msec  AORTA Ao Root diam: 2.80 cm Ao Asc  diam:  2.60 cm MITRAL VALVE                TRICUSPID VALVE MV Area (PHT): 4.29 cm     TR Peak grad:   15.2 mmHg MV Decel Time: 177 msec     TR Vmax:        195.00 cm/s MR Peak grad: 84.6 mmHg MR Vmax:      460.00 cm/s   SHUNTS MV E velocity: 110.00 cm/s  Systemic VTI:  0.16 m MV A velocity: 47.60 cm/s   Systemic Diam: 1.90 cm MV E/A ratio:  2.31 Kardie Tobb DO Electronically signed by Berniece Salines DO Signature Date/Time: 10/09/2022/5:10:12 PM    Final    CT HEAD WO CONTRAST  Result Date: 10/09/2022 CLINICAL DATA:  Trauma. EXAM: CT HEAD WITHOUT CONTRAST CT CERVICAL SPINE WITHOUT CONTRAST TECHNIQUE: Multidetector CT imaging of the head and cervical spine was performed following the standard protocol without intravenous contrast. Multiplanar CT image reconstructions of the cervical spine were also generated. RADIATION DOSE REDUCTION: This exam was  performed according to the departmental dose-optimization program which includes automated exposure control, adjustment of the mA and/or kV according to patient size and/or use of iterative reconstruction technique. COMPARISON:  None Available. FINDINGS: CT HEAD FINDINGS Brain: The ventricles and sulci appropriate size for patient's age. Mild periventricular and deep white matter chronic microvascular ischemic changes noted. There is no acute intracranial hemorrhage. No mass effect or midline shift. No extra-axial fluid collection. Vascular: None Skull: Normal. Negative for fracture or focal lesion. Sinuses/Orbits: Diffuse mucoperiosteal thickening of paranasal sinuses. No air-fluid level. The mastoid air cells are clear. Other: None CT CERVICAL SPINE FINDINGS Alignment: No acute subluxation. Skull base and vertebrae: No acute fracture. Soft tissues and spinal canal: No prevertebral fluid or swelling. No visible canal hematoma. Disc levels:  No acute findings.  Degenerative changes. Upper chest: A 6 mm right apical nodule or a vascular confluence (93/4 and 17/9). Dedicated chest CT recommended for better evaluation of the lungs on a nonemergent/outpatient basis. Other: Bilateral carotid bulb calcified plaques. There is a 3.0 x 2.5 cm soft tissue density in the medial apical pleural surface. Although this may represent vascular confluence a mass is not excluded. IMPRESSION: 1. No acute intracranial pathology. 2. Mild chronic microvascular ischemic changes. 3. No acute/traumatic cervical spine pathology. 4. A 6 mm right apical nodule or a vascular confluence. Additionally there is a right apical soft tissue mass versus confluence of vessels. Dedicated chest CT with IV contrast is recommended for better evaluation of the lungs. This can be performed on a nonemergent/outpatient basis. Electronically Signed   By: Anner Crete M.D.   On: 10/09/2022 02:13   CT CERVICAL SPINE WO CONTRAST  Result Date:  10/09/2022 CLINICAL DATA:  Trauma. EXAM: CT HEAD WITHOUT CONTRAST CT CERVICAL SPINE WITHOUT CONTRAST TECHNIQUE: Multidetector CT imaging of the head and cervical spine was performed following the standard protocol without intravenous contrast. Multiplanar CT image reconstructions of the cervical spine were also generated. RADIATION DOSE REDUCTION: This exam was performed according to the departmental dose-optimization program which includes automated exposure control, adjustment of the mA and/or kV according to patient size and/or use of iterative reconstruction technique. COMPARISON:  None Available. FINDINGS: CT HEAD FINDINGS Brain: The ventricles and sulci appropriate size for patient's age. Mild periventricular and deep white matter chronic microvascular ischemic changes noted. There is no acute intracranial hemorrhage. No mass effect or midline shift. No extra-axial fluid collection. Vascular: None Skull: Normal.  Negative for fracture or focal lesion. Sinuses/Orbits: Diffuse mucoperiosteal thickening of paranasal sinuses. No air-fluid level. The mastoid air cells are clear. Other: None CT CERVICAL SPINE FINDINGS Alignment: No acute subluxation. Skull base and vertebrae: No acute fracture. Soft tissues and spinal canal: No prevertebral fluid or swelling. No visible canal hematoma. Disc levels:  No acute findings.  Degenerative changes. Upper chest: A 6 mm right apical nodule or a vascular confluence (93/4 and 17/9). Dedicated chest CT recommended for better evaluation of the lungs on a nonemergent/outpatient basis. Other: Bilateral carotid bulb calcified plaques. There is a 3.0 x 2.5 cm soft tissue density in the medial apical pleural surface. Although this may represent vascular confluence a mass is not excluded. IMPRESSION: 1. No acute intracranial pathology. 2. Mild chronic microvascular ischemic changes. 3. No acute/traumatic cervical spine pathology. 4. A 6 mm right apical nodule or a vascular confluence.  Additionally there is a right apical soft tissue mass versus confluence of vessels. Dedicated chest CT with IV contrast is recommended for better evaluation of the lungs. This can be performed on a nonemergent/outpatient basis. Electronically Signed   By: Anner Crete M.D.   On: 10/09/2022 02:13   DG Chest Port 1 View  Result Date: 10/09/2022 CLINICAL DATA:  Level 2 fall on blood thinners EXAM: PORTABLE CHEST 1 VIEW COMPARISON:  08/21/2019 FINDINGS: Stable cardiomegaly. No pneumothorax. No definite pleural effusion. No focal consolidation. No definite displaced rib fractures. Telemetry leads overlie the chest. IMPRESSION: No definite acute abnormality.  Cardiomegaly. Electronically Signed   By: Placido Sou M.D.   On: 10/09/2022 01:22        Scheduled Meds:  diltiazem  120 mg Oral Daily   flecainide  75 mg Oral BID   ipratropium-albuterol  3 mL Nebulization BID   oseltamivir  75 mg Oral BID   Warfarin - Pharmacist Dosing Inpatient   Does not apply q1600   Continuous Infusions:  heparin 1,200 Units/hr (10/09/22 1727)     LOS: 0 days   Time spent= 35 mins    Icey Tello Arsenio Loader, MD [facility]  If 7PM-7AM, please contact night-coverage  10/10/2022, 8:16 AM

## 2022-10-10 NOTE — Progress Notes (Signed)
Rounding Note    Patient Name: Stacy Chapman Date of Encounter: 10/10/2022  [facility] Cardiologist: Mertie Moores, MD    Subjective    66 yo with hx of atrial fib,  Has been seen by [facility] in the past, more recently by the Afib clinic  Was having some post conversion pauses on admission .  Had not been taking her diltiazem or flecainide   Also has influenza    She continues to have atrial fib with occasional sinus rhythm - typically with a post conversion pause as she converts.    Overall is feeling better  More short of breath than usual because of her influenza     Inpatient Medications    Scheduled Meds:  diltiazem  120 mg Oral Daily   flecainide  75 mg Oral BID   ipratropium  0.5 mg Nebulization BID   levalbuterol  0.63 mg Nebulization BID   oseltamivir  75 mg Oral BID   Warfarin - Pharmacist Dosing Inpatient   Does not apply q1600   Continuous Infusions:  heparin 1,200 Units/hr (10/09/22 1727)   PRN Meds: acetaminophen, guaiFENesin, hydrALAZINE, ipratropium-albuterol, metoprolol tartrate, ondansetron (ZOFRAN) IV, oxyCODONE, senna-docusate, traZODone   Vital Signs    Vitals:   10/09/22 2300 10/10/22 0024 10/10/22 0449 10/10/22 0738  BP:  (!) 98/44 (!) 106/50 (!) 103/46  Pulse:  74 70 86  Resp:  '18 16 16  '$ Temp:  99 F (37.2 C) 97.7 F (36.5 C) 98 F (36.7 C)  TempSrc:  Oral Oral Oral  SpO2: 95% 95% 95% 97%  Weight:      Height:       No intake or output data in the 24 hours ending 10/10/22 1124    10/09/2022   12:49 AM 07/11/2022   10:39 AM 05/01/2022   10:33 AM  Last 3 Weights  Weight (lbs) 220 lb 225 lb 6.4 oz 219 lb 12.8 oz  Weight (kg) 99.791 kg 102.241 kg 99.701 kg      Telemetry    Currently in NSR ,  has PAF with post conversion pauses  - Personally Reviewed  ECG     - Personally Reviewed  Physical Exam   GEN: middle age female,   Neck: No JVD Cardiac: RRR, no murmurs, rubs, or gallops.  Respiratory:  Clear to auscultation bilaterally. GI: Soft, nontender, non-distended  MS: No edema; No deformity. Neuro:  Nonfocal  Psych: Normal affect   Labs    High Sensitivity Troponin:  No results for input(s): "TROPONINIHS" in the last 720 hours.   Chemistry Recent Labs  Lab 10/09/22 0040 10/09/22 0055 10/10/22 0723  NA 133* 135 134*  K 4.6 4.7 4.1  CL 106 106 107  CO2 23  --  21*  GLUCOSE 89 90 98  BUN '18 23 19  '$ CREATININE 1.04* 0.90 1.67*  CALCIUM 8.8*  --  8.1*  MG  --   --  1.7  PROT 7.3  --   --   ALBUMIN 3.1*  --   --   AST 27  --   --   ALT 18  --   --   ALKPHOS 37*  --   --   BILITOT 0.8  --   --   GFRNONAA 59*  --  34*  ANIONGAP 4*  --  6    Lipids No results for input(s): "CHOL", "TRIG", "HDL", "LABVLDL", "LDLCALC", "CHOLHDL" in the last 168 hours.  Hematology Recent Labs  Lab 10/09/22  0762 10/09/22 0350 10/10/22 0723  WBC  --  5.0 4.6  RBC  --  4.14 3.74*  HGB 12.9 11.6* 10.7*  HCT 38.0 37.3 32.9*  MCV  --  90.1 88.0  MCH  --  28.0 28.6  MCHC  --  31.1 32.5  RDW  --  14.3 14.5  PLT  --  200 172   Thyroid No results for input(s): "TSH", "FREET4" in the last 168 hours.  BNP Recent Labs  Lab 10/09/22 0350  BNP 512.3*    DDimer No results for input(s): "DDIMER" in the last 168 hours.   Radiology    CT CHEST ABDOMEN PELVIS W CONTRAST  Addendum Date: 10/10/2022   ADDENDUM REPORT: 10/10/2022 04:12 ADDENDUM: Addendum to add additional findings upon further review. 2.4 x 2.1 cm soft tissue nodule along the posterior right trachea (3/10) is indeterminate and may be related to the mediastinal adenopathy however further evaluation with MRI with and without contrast could be considered. 1.0 cm nodule in the right upper lobe (measured on coronal image). Consider one of the following in 3 months for both low-risk and high-risk individuals: (a) repeat chest CT, (b) follow-up PET-CT, or (c) tissue sampling. This recommendation follows the consensus statement:  Guidelines for Management of Incidental Pulmonary Nodules Detected on CT Images: From the Fleischner Society 2017; Radiology 2017; 284:228-243. Addended results will be called to the ordering clinician or representative by the Radiologist Assistant, and communication documented in the PACS or [employer]. Electronically Signed   By: Placido Sou M.D.   On: 10/10/2022 04:12   Result Date: 10/10/2022 CLINICAL DATA:  Weakness.  Lung mass EXAM: CT CHEST, ABDOMEN, AND PELVIS WITH CONTRAST TECHNIQUE: Multidetector CT imaging of the chest, abdomen and pelvis was performed following the standard protocol during bolus administration of intravenous contrast. RADIATION DOSE REDUCTION: This exam was performed according to the departmental dose-optimization program which includes automated exposure control, adjustment of the mA and/or kV according to patient size and/or use of iterative reconstruction technique. CONTRAST:  33m OMNIPAQUE IOHEXOL 350 MG/ML SOLN COMPARISON:  Radiograph 10/09/2022 FINDINGS: CT CHEST FINDINGS Cardiovascular: Normal heart size. No pericardial effusion. The main pulmonary artery is enlarged measuring 3.5 cm. No central pulmonary embolus. Mediastinum/Nodes: Calcified hilar and mediastinal nodes. The mediastinal nodes are enlarged. For example 1.9 cm subcarinal node. Unremarkable esophagus. Lungs/Pleura: Scarring in the lower lungs. Subpleural reticular opacities and bronchiolectasis with question of early honeycombing. There are a few scattered centrilobular ground-glass micro nodules compatible with small airway infection/inflammation. No pleural effusion or pneumothorax. Musculoskeletal: No acute abnormality. CT ABDOMEN PELVIS FINDINGS Hepatobiliary: No focal hepatic lesion. Unremarkable gallbladder. No biliary dilation. Pancreas: Unremarkable. No pancreatic ductal dilatation or surrounding inflammatory changes. Spleen: Normal in size without focal abnormality. Adrenals/Urinary Tract:  Unremarkable adrenal glands. Low-attenuation lesions in the kidneys are statistically likely to represent cysts. No follow-up is required. No urinary calculi or hydronephrosis. Unremarkable bladder. Stomach/Bowel: Normal caliber large and small bowel. No bowel wall thickening. Appendix is unremarkable. Stomach is decompressed. Vascular/Lymphatic: Mild aorto bi-iliac atherosclerotic calcification. No suspicious lymphadenopathy. Reproductive: Uterus and bilateral adnexa are unremarkable. Other: No free intraperitoneal fluid or air. Musculoskeletal: No acute fracture. IMPRESSION: 1. Calcified hilar and mediastinal nodes which are enlarged. This is indeterminate and may be due to prior granulomatous infection/inflammation or treated lymphoma. 2. Question early interstitial lung disease given subpleural reticular opacities, blunt bronchiolectasis and possible early honeycombing in the lower lungs. 3. Mild small airway infection/inflammation. 4. No acute abnormality in the abdomen or  pelvis. Electronically Signed: By: Placido Sou M.D. On: 10/09/2022 04:04   ECHOCARDIOGRAM COMPLETE  Result Date: 10/09/2022    ECHOCARDIOGRAM REPORT   Patient Name:   WESSIE SHANKS Date of Exam: 10/09/2022 Medical Rec #:  [id number]            Height:       67.0 in Accession #:    [id number]           Weight:       220.0 lb Date of Birth:  01-14-1956            BSA:          2.106 m Patient Age:    57 years             BP:           128/72 mmHg Patient Gender: F                    HR:           75 bpm. Exam Location:  Inpatient Procedure: 2D Echo and Intracardiac Opacification Agent Indications:    syncope  History:        Patient has prior history of Echocardiogram examinations, most                 recent 09/12/2019. Arrythmias:Atrial Fibrillation; Risk                 Factors:Hypertension, Diabetes and Dyslipidemia.  Sonographer:    Harvie Junior Referring Phys: [id number] [place]  1. Left ventricular  ejection fraction, by estimation, is 40 to 45%. The left ventricle has mildly decreased function. The left ventricle demonstrates global hypokinesis. There is mild concentric left ventricular hypertrophy. Left ventricular diastolic parameters are indeterminate.  2. Right ventricular systolic function is normal. The right ventricular size is normal. There is normal pulmonary artery systolic pressure.  3. The mitral valve is normal in structure. Mild mitral valve regurgitation. No evidence of mitral stenosis.  4. The aortic valve is normal in structure. Aortic valve regurgitation is trivial. No aortic stenosis is present.  5. The inferior vena cava is normal in size with greater than 50% respiratory variability, suggesting right atrial pressure of 3 mmHg. FINDINGS  Left Ventricle: Left ventricular ejection fraction, by estimation, is 40 to 45%. The left ventricle has mildly decreased function. The left ventricle demonstrates global hypokinesis. Definity contrast agent was given IV to delineate the left ventricular  endocardial borders. The left ventricular internal cavity size was normal in size. There is mild concentric left ventricular hypertrophy. Left ventricular diastolic parameters are indeterminate. Right Ventricle: The right ventricular size is normal. No increase in right ventricular wall thickness. Right ventricular systolic function is normal. There is normal pulmonary artery systolic pressure. The tricuspid regurgitant velocity is 1.95 m/s, and  with an assumed right atrial pressure of 3 mmHg, the estimated right ventricular systolic pressure is 82.9 mmHg. Left Atrium: Left atrial size was normal in size. Right Atrium: Right atrial size was normal in size. Pericardium: There is no evidence of pericardial effusion. Mitral Valve: The mitral valve is normal in structure. Mild mitral valve regurgitation. No evidence of mitral valve stenosis. Tricuspid Valve: The tricuspid valve is normal in structure.  Tricuspid valve regurgitation is not demonstrated. No evidence of tricuspid stenosis. Aortic Valve: The aortic valve is normal in structure. Aortic valve regurgitation is trivial. Aortic regurgitation PHT measures 805 msec. No aortic stenosis is present.  Aortic valve mean gradient measures 5.0 mmHg. Aortic valve peak gradient measures 8.3  mmHg. Aortic valve area, by VTI measures 2.15 cm. Pulmonic Valve: The pulmonic valve was normal in structure. Pulmonic valve regurgitation is not visualized. No evidence of pulmonic stenosis. Aorta: The aortic root is normal in size and structure. Venous: The inferior vena cava is normal in size with greater than 50% respiratory variability, suggesting right atrial pressure of 3 mmHg. IAS/Shunts: No atrial level shunt detected by color flow Doppler.  LEFT VENTRICLE PLAX 2D LVIDd:         4.65 cm     Diastology LVIDs:         3.50 cm     LV e' medial:    11.30 cm/s LV PW:         1.10 cm     LV E/e' medial:  9.7 LV IVS:        0.95 cm     LV e' lateral:   10.60 cm/s LVOT diam:     1.90 cm     LV E/e' lateral: 10.4 LV SV:         45 LV SV Index:   21 LVOT Area:     2.84 cm  LV Volumes (MOD) LV vol d, MOD A2C: 90.6 ml LV vol d, MOD A4C: 85.3 ml LV vol s, MOD A2C: 47.8 ml LV vol s, MOD A4C: 47.6 ml LV SV MOD A2C:     42.8 ml LV SV MOD A4C:     85.3 ml LV SV MOD BP:      40.0 ml RIGHT VENTRICLE RV Basal diam:  3.40 cm RV Mid diam:    2.50 cm RV S prime:     10.20 cm/s TAPSE (M-mode): 1.3 cm LEFT ATRIUM           Index        RIGHT ATRIUM           Index LA diam:      3.60 cm 1.71 cm/m   RA Area:     11.00 cm LA Vol (A4C): 35.0 ml 16.62 ml/m  RA Volume:   23.60 ml  11.21 ml/m  AORTIC VALVE                     PULMONIC VALVE AV Area (Vmax):    1.94 cm      PV Vmax:       1.15 m/s AV Area (Vmean):   1.91 cm      PV Peak grad:  5.3 mmHg AV Area (VTI):     2.15 cm AV Vmax:           144.00 cm/s AV Vmean:          109.000 cm/s AV VTI:            0.210 m AV Peak Grad:      8.3 mmHg  AV Mean Grad:      5.0 mmHg LVOT Vmax:         98.30 cm/s LVOT Vmean:        73.300 cm/s LVOT VTI:          0.159 m LVOT/AV VTI ratio: 0.76 AI PHT:            805 msec  AORTA Ao Root diam: 2.80 cm Ao Asc diam:  2.60 cm MITRAL VALVE                TRICUSPID  VALVE MV Area (PHT): 4.29 cm     TR Peak grad:   15.2 mmHg MV Decel Time: 177 msec     TR Vmax:        195.00 cm/s MR Peak grad: 84.6 mmHg MR Vmax:      460.00 cm/s   SHUNTS MV E velocity: 110.00 cm/s  Systemic VTI:  0.16 m MV A velocity: 47.60 cm/s   Systemic Diam: 1.90 cm MV E/A ratio:  2.31 Kardie Tobb DO Electronically signed by Berniece Salines DO Signature Date/Time: 10/09/2022/5:10:12 PM    Final    CT HEAD WO CONTRAST  Result Date: 10/09/2022 CLINICAL DATA:  Trauma. EXAM: CT HEAD WITHOUT CONTRAST CT CERVICAL SPINE WITHOUT CONTRAST TECHNIQUE: Multidetector CT imaging of the head and cervical spine was performed following the standard protocol without intravenous contrast. Multiplanar CT image reconstructions of the cervical spine were also generated. RADIATION DOSE REDUCTION: This exam was performed according to the departmental dose-optimization program which includes automated exposure control, adjustment of the mA and/or kV according to patient size and/or use of iterative reconstruction technique. COMPARISON:  None Available. FINDINGS: CT HEAD FINDINGS Brain: The ventricles and sulci appropriate size for patient's age. Mild periventricular and deep white matter chronic microvascular ischemic changes noted. There is no acute intracranial hemorrhage. No mass effect or midline shift. No extra-axial fluid collection. Vascular: None Skull: Normal. Negative for fracture or focal lesion. Sinuses/Orbits: Diffuse mucoperiosteal thickening of paranasal sinuses. No air-fluid level. The mastoid air cells are clear. Other: None CT CERVICAL SPINE FINDINGS Alignment: No acute subluxation. Skull base and vertebrae: No acute fracture. Soft tissues and spinal canal: No  prevertebral fluid or swelling. No visible canal hematoma. Disc levels:  No acute findings.  Degenerative changes. Upper chest: A 6 mm right apical nodule or a vascular confluence (93/4 and 17/9). Dedicated chest CT recommended for better evaluation of the lungs on a nonemergent/outpatient basis. Other: Bilateral carotid bulb calcified plaques. There is a 3.0 x 2.5 cm soft tissue density in the medial apical pleural surface. Although this may represent vascular confluence a mass is not excluded. IMPRESSION: 1. No acute intracranial pathology. 2. Mild chronic microvascular ischemic changes. 3. No acute/traumatic cervical spine pathology. 4. A 6 mm right apical nodule or a vascular confluence. Additionally there is a right apical soft tissue mass versus confluence of vessels. Dedicated chest CT with IV contrast is recommended for better evaluation of the lungs. This can be performed on a nonemergent/outpatient basis. Electronically Signed   By: Anner Crete M.D.   On: 10/09/2022 02:13   CT CERVICAL SPINE WO CONTRAST  Result Date: 10/09/2022 CLINICAL DATA:  Trauma. EXAM: CT HEAD WITHOUT CONTRAST CT CERVICAL SPINE WITHOUT CONTRAST TECHNIQUE: Multidetector CT imaging of the head and cervical spine was performed following the standard protocol without intravenous contrast. Multiplanar CT image reconstructions of the cervical spine were also generated. RADIATION DOSE REDUCTION: This exam was performed according to the departmental dose-optimization program which includes automated exposure control, adjustment of the mA and/or kV according to patient size and/or use of iterative reconstruction technique. COMPARISON:  None Available. FINDINGS: CT HEAD FINDINGS Brain: The ventricles and sulci appropriate size for patient's age. Mild periventricular and deep white matter chronic microvascular ischemic changes noted. There is no acute intracranial hemorrhage. No mass effect or midline shift. No extra-axial fluid  collection. Vascular: None Skull: Normal. Negative for fracture or focal lesion. Sinuses/Orbits: Diffuse mucoperiosteal thickening of paranasal sinuses. No air-fluid level. The mastoid air cells are clear.  Other: None CT CERVICAL SPINE FINDINGS Alignment: No acute subluxation. Skull base and vertebrae: No acute fracture. Soft tissues and spinal canal: No prevertebral fluid or swelling. No visible canal hematoma. Disc levels:  No acute findings.  Degenerative changes. Upper chest: A 6 mm right apical nodule or a vascular confluence (93/4 and 17/9). Dedicated chest CT recommended for better evaluation of the lungs on a nonemergent/outpatient basis. Other: Bilateral carotid bulb calcified plaques. There is a 3.0 x 2.5 cm soft tissue density in the medial apical pleural surface. Although this may represent vascular confluence a mass is not excluded. IMPRESSION: 1. No acute intracranial pathology. 2. Mild chronic microvascular ischemic changes. 3. No acute/traumatic cervical spine pathology. 4. A 6 mm right apical nodule or a vascular confluence. Additionally there is a right apical soft tissue mass versus confluence of vessels. Dedicated chest CT with IV contrast is recommended for better evaluation of the lungs. This can be performed on a nonemergent/outpatient basis. Electronically Signed   By: Anner Crete M.D.   On: 10/09/2022 02:13   DG Chest Port 1 View  Result Date: 10/09/2022 CLINICAL DATA:  Level 2 fall on blood thinners EXAM: PORTABLE CHEST 1 VIEW COMPARISON:  08/21/2019 FINDINGS: Stable cardiomegaly. No pneumothorax. No definite pleural effusion. No focal consolidation. No definite displaced rib fractures. Telemetry leads overlie the chest. IMPRESSION: No definite acute abnormality.  Cardiomegaly. Electronically Signed   By: Placido Sou M.D.   On: 10/09/2022 01:22    Cardiac Studies      Patient Profile     66 y.o. female    Story     1.   PAF :  has known PAF .  Was  previously well controlled on flecainide.   Stopped her flecainide and dilt.  Says she was compliant with warfarin   We have resumed flecainide 75 BID Cont Cardizem CD 120 mg a day   She should continue to improve as she gets over her flu infection   2. Influenza :  cont tamiflu per IM           For questions or updates, please contact [place] Please consult [email or web address] for contact info under        Signed, Mertie Moores, MD  10/10/2022, 11:24 AM

## 2022-10-10 NOTE — Progress Notes (Signed)
ANTICOAGULATION CONSULT NOTE  Pharmacy Consult for Warfarin + heparin Indication: atrial fibrillation  Allergies  Allergen Reactions   Lisinopril Cough    Patient Measurements: Height: '5\' 7"'$  (170.2 cm) Weight: 99.8 kg (220 lb) IBW/kg (Calculated) : 61.6  Vital Signs: Temp: 98 F (36.7 C) (12/29 2115) Temp Source: Oral (12/29 2115) BP: 131/62 (12/29 2115) Pulse Rate: 73 (12/29 2115)  Labs: Recent Labs    10/09/22 0040 10/09/22 0055 10/09/22 0055 10/09/22 0350 10/09/22 2311 10/10/22 0723 10/10/22 1148 10/10/22 2026  HGB  --  12.9   < > 11.6*  --  10.7*  --   --   HCT  --  38.0  --  37.3  --  32.9*  --   --   PLT  --   --   --  200  --  172  --   --   LABPROT  --   --   --  20.8*  --  16.7*  --   --   INR  --   --   --  1.8*  --  1.4*  --   --   HEPARINUNFRC  --   --   --   --    < > >1.10* >1.10* 0.82*  CREATININE 1.04* 0.90  --   --   --  1.67*  --   --    < > = values in this interval not displayed.     Estimated Creatinine Clearance: 40.2 mL/min (A) (by C-G formula based on SCr of 1.67 mg/dL (H)).   Medical History: Past Medical History:  Diagnosis Date   Anemia    Arthritis    Asthma    Diabetes ([place])    Essential hypertension    Morbid obesity ([place])    Persistent atrial fibrillation ([place])    Stroke ([place]) 2018   TIA     Assessment: 66 y/o F in the ED s/p fall, on warfarin PTA for afib, CT imaging is negative for any bleeding. Warfarin PTA dosing: 10 mg on Sunday and Thursday, 5 mg all other days  Heparin level remains supratherapeutic at 0.82. No bleeding noted.  Goal of Therapy:  INR 2-3 Heparin Level 0.3-0.7 units/ml Monitor platelets by anticoagulation protocol: Yes   Plan:  -Reduce heparin drip to 750 units/hr -Repeat heparin level in 6h -Daily INR, heparin level, CBC  Dimple Nanas, PharmD, BCPS 10/10/2022 9:27 PM

## 2022-10-10 NOTE — Progress Notes (Signed)
ANTICOAGULATION CONSULT NOTE  Pharmacy Consult for Warfarin + heparin Indication: atrial fibrillation  Allergies  Allergen Reactions   Lisinopril Cough    Patient Measurements: Height: '5\' 7"'$  (170.2 cm) Weight: 99.8 kg (220 lb) IBW/kg (Calculated) : 61.6  Vital Signs: Temp: 97.9 F (36.6 C) (12/29 1227) Temp Source: Oral (12/29 1227) BP: 101/63 (12/29 1227) Pulse Rate: 64 (12/29 1227)  Labs: Recent Labs    10/09/22 0040 10/09/22 0055 10/09/22 0055 10/09/22 0350 10/09/22 2311 10/10/22 0723 10/10/22 1148  HGB  --  12.9   < > 11.6*  --  10.7*  --   HCT  --  38.0  --  37.3  --  32.9*  --   PLT  --   --   --  200  --  172  --   LABPROT  --   --   --  20.8*  --  16.7*  --   INR  --   --   --  1.8*  --  1.4*  --   HEPARINUNFRC  --   --   --   --  0.58 >1.10* >1.10*  CREATININE 1.04* 0.90  --   --   --  1.67*  --    < > = values in this interval not displayed.     Estimated Creatinine Clearance: 40.2 mL/min (A) (by C-G formula based on SCr of 1.67 mg/dL (H)).   Medical History: Past Medical History:  Diagnosis Date   Anemia    Arthritis    Asthma    Diabetes ([place])    Essential hypertension    Morbid obesity ([place])    Persistent atrial fibrillation ([place])    Stroke ([place]) 2018   TIA     Assessment: 66 y/o F in the ED s/p fall, on warfarin PTA for afib, CT imaging is negative for any bleeding.  Warfarin resumed along with IV heparin bridge while INR low. INR today is 1.4, CBC stable. Heparin level >1.1 x2, confirmed drawn appropriately.  Warfarin PTA dosing: 10 mg on Sunday and Thursday, 5 mg all other days   Goal of Therapy:  INR 2-3 Heparin Level 0.3-0.7 units/ml Monitor platelets by anticoagulation protocol: Yes   Plan:  -Hold heparin x1 hour then reduce to 900 units/h -Warfarin 7.'5mg'$  PO x1 tonight -Repeat heparin level in 6h -Daily INR, heparin level, CBC   Arrie Senate, PharmD, BCPS, [facility] Clinical Pharmacist [phone] Please check AMION for  all [place] numbers 10/10/2022

## 2022-10-11 ENCOUNTER — Telehealth: Payer: Self-pay | Admitting: Physician Assistant

## 2022-10-11 ENCOUNTER — Telehealth: Payer: Self-pay | Admitting: Internal Medicine

## 2022-10-11 DIAGNOSIS — I4891 Unspecified atrial fibrillation: Secondary | ICD-10-CM | POA: Diagnosis not present

## 2022-10-11 DIAGNOSIS — I480 Paroxysmal atrial fibrillation: Secondary | ICD-10-CM

## 2022-10-11 LAB — HEPARIN LEVEL (UNFRACTIONATED)
Heparin Unfractionated: 0.5 IU/mL (ref 0.30–0.70)
Heparin Unfractionated: 0.55 IU/mL (ref 0.30–0.70)

## 2022-10-11 LAB — BASIC METABOLIC PANEL
Anion gap: 6 (ref 5–15)
BUN: 23 mg/dL (ref 8–23)
CO2: 23 mmol/L (ref 22–32)
Calcium: 8.2 mg/dL — ABNORMAL LOW (ref 8.9–10.3)
Chloride: 108 mmol/L (ref 98–111)
Creatinine, Ser: 1.07 mg/dL — ABNORMAL HIGH (ref 0.44–1.00)
GFR, Estimated: 57 mL/min — ABNORMAL LOW (ref >60)
Glucose, Bld: 109 mg/dL — ABNORMAL HIGH (ref 70–99)
Potassium: 4.3 mmol/L (ref 3.5–5.1)
Sodium: 137 mmol/L (ref 135–145)

## 2022-10-11 LAB — PROTIME-INR
INR: 1.5 — ABNORMAL HIGH (ref 0.8–1.2)
Prothrombin Time: 17.7 seconds — ABNORMAL HIGH (ref 11.4–15.2)

## 2022-10-11 LAB — CBC
HCT: 29.7 % — ABNORMAL LOW (ref 36.0–46.0)
Hemoglobin: 9.6 g/dL — ABNORMAL LOW (ref 12.0–15.0)
MCH: 28.2 pg (ref 26.0–34.0)
MCHC: 32.3 g/dL (ref 30.0–36.0)
MCV: 87.1 fL (ref 80.0–100.0)
Platelets: 171 10*3/uL (ref 150–400)
RBC: 3.41 MIL/uL — ABNORMAL LOW (ref 3.87–5.11)
RDW: 14.4 % (ref 11.5–15.5)
WBC: 4.3 10*3/uL (ref 4.0–10.5)
nRBC: 0 % (ref 0.0–0.2)

## 2022-10-11 LAB — MAGNESIUM: Magnesium: 1.7 mg/dL (ref 1.7–2.4)

## 2022-10-11 MED ORDER — PREDNISONE 20 MG PO TABS: 40.0000 mg | ORAL_TABLET | Freq: Every day | ORAL | 0 refills | Status: DC

## 2022-10-11 MED ORDER — OSELTAMIVIR PHOSPHATE 30 MG PO CAPS: 30.0000 mg | ORAL_CAPSULE | Freq: Two times a day (BID) | ORAL | 0 refills | Status: AC

## 2022-10-11 MED ORDER — PREDNISONE 20 MG PO TABS: 40.0000 mg | ORAL_TABLET | Freq: Every day | ORAL | Status: DC

## 2022-10-11 MED ORDER — WARFARIN SODIUM 5 MG PO TABS
10.0000 mg | ORAL_TABLET | Freq: Once | ORAL | Status: AC
  Administered 2022-10-11: 10 mg via ORAL
  Filled 2022-10-11: qty 2

## 2022-10-11 MED ORDER — OSELTAMIVIR PHOSPHATE 30 MG PO CAPS: 30.0000 mg | ORAL_CAPSULE | Freq: Two times a day (BID) | ORAL | 0 refills | Status: DC

## 2022-10-11 NOTE — Progress Notes (Signed)
ANTICOAGULATION CONSULT NOTE  Pharmacy Consult for Warfarin + heparin Indication: atrial fibrillation  Allergies  Allergen Reactions   Lisinopril Cough    Patient Measurements: Height: '5\' 7"'$  (170.2 cm) Weight: 99.8 kg (220 lb) IBW/kg (Calculated) : 61.6  Vital Signs: Temp: 98.3 F (36.8 C) (12/30 0544) Temp Source: Oral (12/30 0544) BP: 114/53 (12/30 0544) Pulse Rate: 73 (12/30 0544)  Labs: Recent Labs    10/09/22 0055 10/09/22 0350 10/09/22 2311 10/10/22 0723 10/10/22 1148 10/10/22 2026 10/11/22 0135  HGB 12.9 11.6*  --  10.7*  --   --  9.6*  HCT 38.0 37.3  --  32.9*  --   --  29.7*  PLT  --  200  --  172  --   --  171  LABPROT  --  20.8*  --  16.7*  --   --  17.7*  INR  --  1.8*  --  1.4*  --   --  1.5*  HEPARINUNFRC  --   --    < > >1.10* >1.10* 0.82* 0.50  CREATININE 0.90  --   --  1.67*  --   --  1.07*   < > = values in this interval not displayed.     Estimated Creatinine Clearance: 62.8 mL/min (A) (by C-G formula based on SCr of 1.07 mg/dL (H)).   Medical History: Past Medical History:  Diagnosis Date   Anemia    Arthritis    Asthma    Diabetes ([place])    Essential hypertension    Morbid obesity ([place])    Persistent atrial fibrillation ([place])    Stroke ([place]) 2018   TIA     Assessment: 66 y/o F in the ED s/p fall, on warfarin PTA for afib, CT imaging is negative for any bleeding. Warfarin PTA dosing: 10 mg on Sunday and Thursday, 5 mg all other days  Heparin level therapeutic at 0.55.  No issues with infusion or bleeding per RN.  Goal of Therapy:  INR 2-3 Heparin Level 0.3-0.7 units/ml Monitor platelets by anticoagulation protocol: Yes   Plan:  -Continue heparin drip at 750 units/hr -Warfarin 10 mg x1 tonight  -Daily INR, heparin level, CBC  Francena Hanly, PharmD Pharmacy Resident  10/11/2022 7:39 AM

## 2022-10-11 NOTE — Telephone Encounter (Addendum)
To office staff:  1) Monitor team: Dr. Marlou Porch requests to mail this patient at 14 day non live Zio monitor for PAF, pauses, Dr. Acie Fredrickson to read. Order entered. Patient made aware via AVS to expect monitor mailed to her home.  2) Anticoagulation team: please arrange early follow-up with this patient for Coumadin recheck early next week per Dr. Marlou Porch given subtherapeutic INR. He considered Lovenox injections but he ultimately recommended not to pursue, just needs early recheck.   Otherwise pt has f/u 1/9 with afib clinic that Dr. Marlou Porch suggests to keep.  Thank you!

## 2022-10-11 NOTE — Discharge Summary (Signed)
Physician Discharge Summary  Stacy Chapman RAQ:[id number] DOB: [birth year] DOA: 10/09/2022  PCP: [name], NP  Admit date: 10/09/2022 Discharge date: 10/11/2022  Time spent: 40 minutes  Recommendations for Outpatient Follow-up:  Follow outpatient CBC/CMP  Follow with pulm outpatient for concern for sarcoid, abnormal chest CT (see imaging findings) Follow INR outpatient with coumadin clinic, continue warfarin as prescribed Follow ziopatch results outpatient  Follow HR outpatient Follow HF, reduced EF outpatient   Discharge Diagnoses:  Principal Problem:   Atrial fibrillation with RVR ([place]) Active Problems:   Diarrhea of presumed infectious origin   DM (diabetes mellitus) ([place])   Hyperlipidemia   Abnormal CT scan of lung   Discharge Condition: stable  Diet recommendation: heart healthy, diabetic  Filed Weights   10/09/22 0049  Weight: 99.8 kg    History of present illness:  66 year old with history of Stacy Chapman-fib, prior TIA on Coumadin admitted to the hospital for syncopal episode and severe diarrhea.  Patient was found to be in atrial fibrillation with RVR, CT of the chest abdomen pelvis showed possible early interstitial lung disease otherwise no other acute intra-abdominal pathology.  Lab work was overall unremarkable.  Patient was started on Cardizem drip along with home p.o. medication.  Patient's flu swab came back positive therefore started on Tamiflu.  Cardiology team consulted for difficult to manage atrial fibrillation. She's improved with treatment.  Pulm referral placed for abnormal CT scan of chest.  See below for additional details  Hospital Course:  Assessment and Plan: Syncope In setting of influenza infection and dehydration Cards planning for outpatient monitor  Atrial fibrillation with RVR [facility]) [place] cardiology, recommending continue flecainide, diltiazem Follow with cardiology outpatient Continue warfarin, no need for bridge per  cards   Congestive heart failure with reduced ejection fraction, 45% Echocardiogram in 2020 showed EF 60%, this admission it is 45% with global hypokinesia.  Cardiology following Needs outpatient cardiology follow up   Diarrhea of presumed infectious origin Likely viral in nature.  Influenza positive.  GI panel ordered, but not collected.  Seems to have improved.   Influenza Stacy Chapman infection On Tamiflu Some mild wheezing noted (also small airway inflammation on CT), discharged with steroids   Hypocalcemia Follow outpatient   Abnormal CT scan of lung  Concern for Sarcoidosis  Concern for Interstitial Lung Disease CT with calcified hilar and mediastinal nodes which are enlarged, ? Early interstitial lung disease, mild small airway infection/inflammation  Also, 2.4x2.1 cm soft tissue nodule along posterior R trachea (consider MRI with/without contrast) 1 cm nodule in R upper lobe - needs CT within 3 months (or PET CT or tissue sampling) Pulm referral placed, requested they help arrange follow up    Hyperlipidemia Statin   DM type II (diabetes mellitus) ([place]) A1c 5.9 06/2022, not on home meds  Obesity Body mass index is 34.46 kg/m.  She denied needing any refills      Procedures:  Echo IMPRESSIONS     1. Left ventricular ejection fraction, by estimation, is 40 to 45%. The  left ventricle has mildly decreased function. The left ventricle  demonstrates global hypokinesis. There is mild concentric left ventricular  hypertrophy. Left ventricular diastolic  parameters are indeterminate.   2. Right ventricular systolic function is normal. The right ventricular  size is normal. There is normal pulmonary artery systolic pressure.   3. The mitral valve is normal in structure. Mild mitral valve  regurgitation. No evidence of mitral stenosis.   4. The aortic valve is normal in  structure. Aortic valve regurgitation is  trivial. No aortic stenosis is present.   5. The inferior vena  cava is normal in size with greater than 50%  respiratory variability, suggesting right atrial pressure of 3 mmHg.   Consultations: cardiology  Discharge Exam: Vitals:   10/11/22 0544 10/11/22 0934  BP: (!) 114/53 (!) 120/57  Pulse: 73   Resp: 18   Temp: 98.3 F (36.8 C)   SpO2: 99%    No new complaints, notes some SOB with exertion Discussed d/c recs   General: No acute distress. Cardiovascular: RRR Lungs: scattered rhonchi, faint wheezing Abdomen: Soft, nontender, nondistended Neurological: Alert and oriented 3. Moves all extremities 4. Cranial nerves II through XII grossly intact. Extremities: No clubbing or cyanosis. No edema.  Discharge Instructions   Discharge Instructions     Ambulatory referral to Pulmonology   Complete by: As directed    Abnormal CT scan   Reason for referral: Other   Call MD for:  difficulty breathing, headache or visual disturbances   Complete by: As directed    Call MD for:  extreme fatigue   Complete by: As directed    Call MD for:  hives   Complete by: As directed    Call MD for:  persistant dizziness or light-headedness   Complete by: As directed    Call MD for:  persistant nausea and vomiting   Complete by: As directed    Call MD for:  redness, tenderness, or signs of infection (pain, swelling, redness, odor or green/yellow discharge around incision site)   Complete by: As directed    Call MD for:  severe uncontrolled pain   Complete by: As directed    Call MD for:  temperature >100.4   Complete by: As directed    Diet - low sodium heart healthy   Complete by: As directed    Discharge instructions   Complete by: As directed    You were seen for syncope (fainting) in the setting of an influenza infection and atrial fibrillation with fast heart rate.  You've improved with treatment.  We'll send you out on tamiflu for your influenza infection.  For your atrial fibrillation, your INR is subtherapeutic.  Continue your warfarin  as prescribed and follow up with the Coumadin Clinic for an INR check next week.  Continue your diltiazem and flecanide.  Your echo (ultrasound of your heart) showed mildly decreased squeeze or pump (an ejection fraction of 40-45%).  Follow this up with cardiology as an outpatient.  Cardiology is arranging Envi Eagleson monitor to watch your heart rhythm outpatient given your fainting episode.  Your CT scan of your chest was abnormal.  I suspect you have sarcoidosis based on this CT scan and Davi Kroon prior CT scan from 2003.  You should follow up with pulmonology outpatient regarding this possible diagnosis and the abnormal CT scan findings.  You should have Shakiyla Kook repeat CT scan within 3 months for Stephenie Navejas pulmonary nodule in your right upper lobe (vs Novalyn Lajara PET scan or tissue sampling).  Radiology mentioned considering an MRI for Blessed Cotham 2.4x2.1 cm soft tissue nodule in your posterior right trachea.  You can follow these findings up with your PCP or pulmonology.  You had Zineb Glade possibility of interstitial lung disease and possible early honeycombing, you'll follow that up with pulmonology regarding this.  Return for new, recurrent, or worsening symptoms.  Please ask your PCP to request records from this hospitalization so they know what was done and what the next steps  will be.   Increase activity slowly   Complete by: As directed       Allergies as of 10/11/2022       Reactions   Lisinopril Cough        Medication List     TAKE these medications    acetaminophen 500 MG tablet Commonly known as: TYLENOL Take 1,000 mg by mouth as needed for moderate pain.   albuterol 108 (90 Base) MCG/ACT inhaler Commonly known as: VENTOLIN HFA Need office visit for addiitonal refills   azelastine 0.1 % nasal spray Commonly known as: ASTELIN Place 1 spray into both nostrils 2 (two) times daily. Use in each nostril as directed   Biotin 2.5 MG Tabs Take 1 tablet by mouth daily.   diltiazem 120 MG 24 hr capsule Commonly known as:  CARDIZEM CD TAKE 1 CAPSULE BY MOUTH DAILY What changed:  how much to take how to take this when to take this   ferrous gluconate 324 MG tablet Commonly known as: FERGON Take 324 mg by mouth daily with breakfast.   flecainide 50 MG tablet Commonly known as: TAMBOCOR Take 1.5 tablets (75 mg total) by mouth 2 (two) times daily.   fluticasone 50 MCG/ACT nasal spray Commonly known as: FLONASE SHAKE LIQUID AND USE 2 SPRAYS IN EACH NOSTRIL DAILY   furosemide 20 MG tablet Commonly known as: LASIX TAKE 1 TABLET BY MOUTH DAILY AS NEEDED FOR SWELLING OR WEIGHT GAIN What changed:  how much to take how to take this when to take this reasons to take this additional instructions   montelukast 10 MG tablet Commonly known as: SINGULAIR TAKE 1 TABLET(10 MG) BY MOUTH AT BEDTIME What changed: See the new instructions.   multivitamin capsule Take 1 capsule by mouth daily.   oseltamivir 30 MG capsule Commonly known as: TAMIFLU Take 1 capsule (30 mg total) by mouth 2 (two) times daily for 5 doses. Take your next dose tonight.   predniSONE 20 MG tablet Commonly known as: DELTASONE Take 2 tablets (40 mg total) by mouth daily with breakfast for 4 days. Start taking on: October 12, 2022   rosuvastatin 10 MG tablet Commonly known as: Crestor Take 1 tablet (10 mg total) by mouth daily.   warfarin 5 MG tablet Commonly known as: COUMADIN Take as directed. If you are unsure how to take this medication, talk to your nurse or doctor. Original instructions: TAKE 1 TABLET TO 2 TABLETS BY MOUTH DAILY AS DIRECTED BY COUMADIN CLINIC What changed:  how much to take how to take this when to take this additional instructions       Allergies  Allergen Reactions   Lisinopril Cough    Follow-up Information     [facility] Follow up.   Specialty: Cardiology Why: Afib Clinic follow-up is scheduled for Tuesday Oct 21, 2022 at 10:00 AM. Please arrive 15 minutes prior  to appointment to check in. Contact information: [address] [id number] [place] [facility] [place]. [facility] Follow up.   Specialty: Cardiology Why: [place] office will be mailing you Iliza Blankenbeckler 2 week monitor to wear for your atrial fibrillation. It will come with instructions for use as well as Randon Somera phone number to call the [facility] for any questions. You can also call our office if you have any questions. The office will also contact you to set up Asra Gambrel  Coumadin/INR check early next week. Contact information: [place] [id number] [address] [place] [facility] Follow up.   Specialty: Pulmonology Why: you should receive Kaye Mitro call to follow up with regards to your abnormal CT scan and concern for sarcoid - if you don't get Kaleel Schmieder phone call within 1-2 weeks, please call for an appointment Contact information: [place] [phone]                 The results of significant diagnostics from this hospitalization (including imaging, microbiology, ancillary and laboratory) are listed below for reference.    Significant Diagnostic Studies: CT CHEST ABDOMEN PELVIS W CONTRAST  Addendum Date: 10/10/2022   ADDENDUM REPORT: 10/10/2022 04:12 ADDENDUM: Addendum to add additional findings upon further review. 2.4 x 2.1 cm soft tissue nodule along the posterior right trachea (3/10) is indeterminate and may be related to the mediastinal adenopathy however further evaluation with MRI with and without contrast could be considered. 1.0 cm nodule in the right upper lobe (measured on coronal image). Consider one of the following in 3 months for both low-risk and high-risk individuals: (Liban Guedes) repeat chest CT, (b) follow-up PET-CT, or (c) tissue sampling. This recommendation follows the consensus  statement: Guidelines for Management of Incidental Pulmonary Nodules Detected on CT Images: From the Fleischner Society 2017; Radiology 2017; 284:228-243. Addended results will be called to the ordering clinician or representative by the Radiologist Assistant, and communication documented in the PACS or [employer]. Electronically Signed   By: Placido Sou M.D.   On: 10/10/2022 04:12   Result Date: 10/10/2022 CLINICAL DATA:  Weakness.  Lung mass EXAM: CT CHEST, ABDOMEN, AND PELVIS WITH CONTRAST TECHNIQUE: Multidetector CT imaging of the chest, abdomen and pelvis was performed following the standard protocol during bolus administration of intravenous contrast. RADIATION DOSE REDUCTION: This exam was performed according to the departmental dose-optimization program which includes automated exposure control, adjustment of the mA and/or kV according to patient size and/or use of iterative reconstruction technique. CONTRAST:  64m OMNIPAQUE IOHEXOL 350 MG/ML SOLN COMPARISON:  Radiograph 10/09/2022 FINDINGS: CT CHEST FINDINGS Cardiovascular: Normal heart size. No pericardial effusion. The main pulmonary artery is enlarged measuring 3.5 cm. No central pulmonary embolus. Mediastinum/Nodes: Calcified hilar and mediastinal nodes. The mediastinal nodes are enlarged. For example 1.9 cm subcarinal node. Unremarkable esophagus. Lungs/Pleura: Scarring in the lower lungs. Subpleural reticular opacities and bronchiolectasis with question of early honeycombing. There are Jaira Canady few scattered centrilobular ground-glass micro nodules compatible with small airway infection/inflammation. No pleural effusion or pneumothorax. Musculoskeletal: No acute abnormality. CT ABDOMEN PELVIS FINDINGS Hepatobiliary: No focal hepatic lesion. Unremarkable gallbladder. No biliary dilation. Pancreas: Unremarkable. No pancreatic ductal dilatation or surrounding inflammatory changes. Spleen: Normal in size without focal abnormality. Adrenals/Urinary  Tract: Unremarkable adrenal glands. Low-attenuation lesions in the kidneys are statistically likely to represent cysts. No follow-up is required. No urinary calculi or hydronephrosis. Unremarkable bladder. Stomach/Bowel: Normal caliber large and small bowel. No bowel wall thickening. Appendix is unremarkable. Stomach is decompressed. Vascular/Lymphatic: Mild aorto bi-iliac atherosclerotic calcification. No suspicious lymphadenopathy. Reproductive: Uterus and bilateral adnexa are unremarkable. Other: No free intraperitoneal fluid or air. Musculoskeletal: No acute fracture. IMPRESSION: 1. Calcified hilar and mediastinal nodes which are enlarged. This is indeterminate and may be due to prior granulomatous infection/inflammation or treated lymphoma. 2. Question early interstitial lung disease given subpleural reticular opacities, blunt bronchiolectasis and possible early honeycombing in the  lower lungs. 3. Mild small airway infection/inflammation. 4. No acute abnormality in the abdomen or pelvis. Electronically Signed: By: Placido Sou M.D. On: 10/09/2022 04:04   ECHOCARDIOGRAM COMPLETE  Result Date: 10/09/2022    ECHOCARDIOGRAM REPORT   Patient Name:   GERALDIN HABERMEHL Date of Exam: 10/09/2022 Medical Rec #:  [id number]            Height:       67.0 in Accession #:    [id number]           Weight:       220.0 lb Date of Birth:  07/30/1956            BSA:          2.106 m Patient Age:    66 years             BP:           128/72 mmHg Patient Gender: F                    HR:           75 bpm. Exam Location:  Inpatient Procedure: 2D Echo and Intracardiac Opacification Agent Indications:    syncope  History:        Patient has prior history of Echocardiogram examinations, most                 recent 09/12/2019. Arrythmias:Atrial Fibrillation; Risk                 Factors:Hypertension, Diabetes and Dyslipidemia.  Sonographer:    Harvie Junior Referring Phys: [id number] [place]  1. Left  ventricular ejection fraction, by estimation, is 40 to 45%. The left ventricle has mildly decreased function. The left ventricle demonstrates global hypokinesis. There is mild concentric left ventricular hypertrophy. Left ventricular diastolic parameters are indeterminate.  2. Right ventricular systolic function is normal. The right ventricular size is normal. There is normal pulmonary artery systolic pressure.  3. The mitral valve is normal in structure. Mild mitral valve regurgitation. No evidence of mitral stenosis.  4. The aortic valve is normal in structure. Aortic valve regurgitation is trivial. No aortic stenosis is present.  5. The inferior vena cava is normal in size with greater than 50% respiratory variability, suggesting right atrial pressure of 3 mmHg. FINDINGS  Left Ventricle: Left ventricular ejection fraction, by estimation, is 40 to 45%. The left ventricle has mildly decreased function. The left ventricle demonstrates global hypokinesis. Definity contrast agent was given IV to delineate the left ventricular  endocardial borders. The left ventricular internal cavity size was normal in size. There is mild concentric left ventricular hypertrophy. Left ventricular diastolic parameters are indeterminate. Right Ventricle: The right ventricular size is normal. No increase in right ventricular wall thickness. Right ventricular systolic function is normal. There is normal pulmonary artery systolic pressure. The tricuspid regurgitant velocity is 1.95 m/s, and  with an assumed right atrial pressure of 3 mmHg, the estimated right ventricular systolic pressure is 01.0 mmHg. Left Atrium: Left atrial size was normal in size. Right Atrium: Right atrial size was normal in size. Pericardium: There is no evidence of pericardial effusion. Mitral Valve: The mitral valve is normal in structure. Mild mitral valve regurgitation. No evidence of mitral valve stenosis. Tricuspid Valve: The tricuspid valve is normal in  structure. Tricuspid valve regurgitation is not demonstrated. No evidence of tricuspid stenosis. Aortic Valve: The aortic valve is normal in structure. Aortic  valve regurgitation is trivial. Aortic regurgitation PHT measures 805 msec. No aortic stenosis is present. Aortic valve mean gradient measures 5.0 mmHg. Aortic valve peak gradient measures 8.3  mmHg. Aortic valve area, by VTI measures 2.15 cm. Pulmonic Valve: The pulmonic valve was normal in structure. Pulmonic valve regurgitation is not visualized. No evidence of pulmonic stenosis. Aorta: The aortic root is normal in size and structure. Venous: The inferior vena cava is normal in size with greater than 50% respiratory variability, suggesting right atrial pressure of 3 mmHg. IAS/Shunts: No atrial level shunt detected by color flow Doppler.  LEFT VENTRICLE PLAX 2D LVIDd:         4.65 cm     Diastology LVIDs:         3.50 cm     LV e' medial:    11.30 cm/s LV PW:         1.10 cm     LV E/e' medial:  9.7 LV IVS:        0.95 cm     LV e' lateral:   10.60 cm/s LVOT diam:     1.90 cm     LV E/e' lateral: 10.4 LV SV:         45 LV SV Index:   21 LVOT Area:     2.84 cm  LV Volumes (MOD) LV vol d, MOD A2C: 90.6 ml LV vol d, MOD A4C: 85.3 ml LV vol s, MOD A2C: 47.8 ml LV vol s, MOD A4C: 47.6 ml LV SV MOD A2C:     42.8 ml LV SV MOD A4C:     85.3 ml LV SV MOD BP:      40.0 ml RIGHT VENTRICLE RV Basal diam:  3.40 cm RV Mid diam:    2.50 cm RV S prime:     10.20 cm/s TAPSE (M-mode): 1.3 cm LEFT ATRIUM           Index        RIGHT ATRIUM           Index LA diam:      3.60 cm 1.71 cm/m   RA Area:     11.00 cm LA Vol (A4C): 35.0 ml 16.62 ml/m  RA Volume:   23.60 ml  11.21 ml/m  AORTIC VALVE                     PULMONIC VALVE AV Area (Vmax):    1.94 cm      PV Vmax:       1.15 m/s AV Area (Vmean):   1.91 cm      PV Peak grad:  5.3 mmHg AV Area (VTI):     2.15 cm AV Vmax:           144.00 cm/s AV Vmean:          109.000 cm/s AV VTI:            0.210 m AV Peak Grad:       8.3 mmHg AV Mean Grad:      5.0 mmHg LVOT Vmax:         98.30 cm/s LVOT Vmean:        73.300 cm/s LVOT VTI:          0.159 m LVOT/AV VTI ratio: 0.76 AI PHT:            805 msec  AORTA Ao Root diam: 2.80 cm Ao Asc diam:  2.60 cm MITRAL VALVE  TRICUSPID VALVE MV Area (PHT): 4.29 cm     TR Peak grad:   15.2 mmHg MV Decel Time: 177 msec     TR Vmax:        195.00 cm/s MR Peak grad: 84.6 mmHg MR Vmax:      460.00 cm/s   SHUNTS MV E velocity: 110.00 cm/s  Systemic VTI:  0.16 m MV Anberlin Diez velocity: 47.60 cm/s   Systemic Diam: 1.90 cm MV E/Echo [place] ratio:  2.31 Kardie Tobb DO Electronically signed by Berniece Salines DO Signature Date/Time: 10/09/2022/5:10:12 PM    Final    CT HEAD WO CONTRAST  Result Date: 10/09/2022 CLINICAL DATA:  Trauma. EXAM: CT HEAD WITHOUT CONTRAST CT CERVICAL SPINE WITHOUT CONTRAST TECHNIQUE: Multidetector CT imaging of the head and cervical spine was performed following the standard protocol without intravenous contrast. Multiplanar CT image reconstructions of the cervical spine were also generated. RADIATION DOSE REDUCTION: This exam was performed according to the departmental dose-optimization program which includes automated exposure control, adjustment of the mA and/or kV according to patient size and/or use of iterative reconstruction technique. COMPARISON:  None Available. FINDINGS: CT HEAD FINDINGS Brain: The ventricles and sulci appropriate size for patient's age. Mild periventricular and deep white matter chronic microvascular ischemic changes noted. There is no acute intracranial hemorrhage. No mass effect or midline shift. No extra-axial fluid collection. Vascular: None Skull: Normal. Negative for fracture or focal lesion. Sinuses/Orbits: Diffuse mucoperiosteal thickening of paranasal sinuses. No air-fluid level. The mastoid air cells are clear. Other: None CT CERVICAL SPINE FINDINGS Alignment: No acute subluxation. Skull base and vertebrae: No acute fracture. Soft tissues and spinal  canal: No prevertebral fluid or swelling. No visible canal hematoma. Disc levels:  No acute findings.  Degenerative changes. Upper chest: Crystallee Werden 6 mm right apical nodule or Robbye Dede vascular confluence (93/4 and 17/9). Dedicated chest CT recommended for better evaluation of the lungs on Jovanni Rash nonemergent/outpatient basis. Other: Bilateral carotid bulb calcified plaques. There is Kimila Papaleo 3.0 x 2.5 cm soft tissue density in the medial apical pleural surface. Although this may represent vascular confluence Erionna Strum mass is not excluded. IMPRESSION: 1. No acute intracranial pathology. 2. Mild chronic microvascular ischemic changes. 3. No acute/traumatic cervical spine pathology. 4. Huxton Glaus 6 mm right apical nodule or Brittaney Beaulieu vascular confluence. Additionally there is Inez Rosato right apical soft tissue mass versus confluence of vessels. Dedicated chest CT with IV contrast is recommended for better evaluation of the lungs. This can be performed on Zaylie Gisler nonemergent/outpatient basis. Electronically Signed   By: Anner Crete M.D.   On: 10/09/2022 02:13   CT CERVICAL SPINE WO CONTRAST  Result Date: 10/09/2022 CLINICAL DATA:  Trauma. EXAM: CT HEAD WITHOUT CONTRAST CT CERVICAL SPINE WITHOUT CONTRAST TECHNIQUE: Multidetector CT imaging of the head and cervical spine was performed following the standard protocol without intravenous contrast. Multiplanar CT image reconstructions of the cervical spine were also generated. RADIATION DOSE REDUCTION: This exam was performed according to the departmental dose-optimization program which includes automated exposure control, adjustment of the mA and/or kV according to patient size and/or use of iterative reconstruction technique. COMPARISON:  None Available. FINDINGS: CT HEAD FINDINGS Brain: The ventricles and sulci appropriate size for patient's age. Mild periventricular and deep white matter chronic microvascular ischemic changes noted. There is no acute intracranial hemorrhage. No mass effect or midline shift. No extra-axial  fluid collection. Vascular: None Skull: Normal. Negative for fracture or focal lesion. Sinuses/Orbits: Diffuse mucoperiosteal thickening of paranasal sinuses. No air-fluid level. The mastoid air cells are  clear. Other: None CT CERVICAL SPINE FINDINGS Alignment: No acute subluxation. Skull base and vertebrae: No acute fracture. Soft tissues and spinal canal: No prevertebral fluid or swelling. No visible canal hematoma. Disc levels:  No acute findings.  Degenerative changes. Upper chest: Braxon Suder 6 mm right apical nodule or Maxie Slovacek vascular confluence (93/4 and 17/9). Dedicated chest CT recommended for better evaluation of the lungs on Jaegar Croft nonemergent/outpatient basis. Other: Bilateral carotid bulb calcified plaques. There is Jaia Alonge 3.0 x 2.5 cm soft tissue density in the medial apical pleural surface. Although this may represent vascular confluence Misty Rago mass is not excluded. IMPRESSION: 1. No acute intracranial pathology. 2. Mild chronic microvascular ischemic changes. 3. No acute/traumatic cervical spine pathology. 4. Luanne Krzyzanowski 6 mm right apical nodule or Mayan Dolney vascular confluence. Additionally there is Betsie Peckman right apical soft tissue mass versus confluence of vessels. Dedicated chest CT with IV contrast is recommended for better evaluation of the lungs. This can be performed on Anguel Delapena nonemergent/outpatient basis. Electronically Signed   By: Anner Crete M.D.   On: 10/09/2022 02:13   DG Chest Port 1 View  Result Date: 10/09/2022 CLINICAL DATA:  Level 2 fall on blood thinners EXAM: PORTABLE CHEST 1 VIEW COMPARISON:  08/21/2019 FINDINGS: Stable cardiomegaly. No pneumothorax. No definite pleural effusion. No focal consolidation. No definite displaced rib fractures. Telemetry leads overlie the chest. IMPRESSION: No definite acute abnormality.  Cardiomegaly. Electronically Signed   By: Placido Sou M.D.   On: 10/09/2022 01:22    Microbiology: Recent Results (from the past 240 hour(s))  Resp panel by RT-PCR (RSV, Flu Clarivel Callaway&B, Covid) Anterior Nasal Swab      Status: Abnormal   Collection Time: 10/09/22  4:14 AM   Specimen: Anterior Nasal Swab  Result Value Ref Range Status   SARS Coronavirus 2 by RT PCR NEGATIVE NEGATIVE Final    Comment: (NOTE) SARS-CoV-2 target nucleic acids are NOT DETECTED.  The SARS-CoV-2 RNA is generally detectable in upper respiratory specimens during the acute phase of infection. The lowest concentration of SARS-CoV-2 viral copies this assay can detect is 138 copies/mL. Kemonte Ullman negative result does not preclude SARS-Cov-2 infection and should not be used as the sole basis for treatment or other patient management decisions. Joseth Weigel negative result may occur with  improper specimen collection/handling, submission of specimen other than nasopharyngeal swab, presence of viral mutation(s) within the areas targeted by this assay, and inadequate number of viral copies(<138 copies/mL). Breyon Blass negative result must be combined with clinical observations, patient history, and epidemiological information. The expected result is Negative.  Fact Sheet for Patients:  EntrepreneurPulse.com.au  Fact Sheet for Healthcare Providers:  IncredibleEmployment.be  This test is no t yet approved or cleared by the [place] FDA and  has been authorized for detection and/or diagnosis of SARS-CoV-2 by FDA under an Emergency Use Authorization (EUA). This EUA will remain  in effect (meaning this test can be used) for the duration of the COVID-19 declaration under Section 564(b)(1) of the Act, 21 U.S.C.section 360bbb-3(b)(1), unless the authorization is terminated  or revoked sooner.       Influenza Shadiamond Koska by PCR POSITIVE (Donta Fuster) NEGATIVE Final   Influenza B by PCR NEGATIVE NEGATIVE Final    Comment: (NOTE) The Xpert Xpress SARS-CoV-2/FLU/RSV plus assay is intended as an aid in the diagnosis of influenza from Nasopharyngeal swab specimens and should not be used as Lashannon Bresnan sole basis for treatment. Nasal washings  and aspirates are unacceptable for Xpert Xpress SARS-CoV-2/FLU/RSV testing.  Fact Sheet for Patients: EntrepreneurPulse.com.au  Fact  Sheet for Healthcare Providers: IncredibleEmployment.be  This test is not yet approved or cleared by the [place] and has been authorized for detection and/or diagnosis of SARS-CoV-2 by FDA under an Emergency Use Authorization (EUA). This EUA will remain in effect (meaning this test can be used) for the duration of the COVID-19 declaration under Section 564(b)(1) of the Act, 21 U.S.C. section 360bbb-3(b)(1), unless the authorization is terminated or revoked.     Resp Syncytial Virus by PCR NEGATIVE NEGATIVE Final    Comment: (NOTE) Fact Sheet for Patients: EntrepreneurPulse.com.au  Fact Sheet for Healthcare Providers: IncredibleEmployment.be  This test is not yet approved or cleared by the [place] FDA and has been authorized for detection and/or diagnosis of SARS-CoV-2 by FDA under an Emergency Use Authorization (EUA). This EUA will remain in effect (meaning this test can be used) for the duration of the COVID-19 declaration under Section 564(b)(1) of the Act, 21 U.S.C. section 360bbb-3(b)(1), unless the authorization is terminated or revoked.  Performed at [facility], [place] [address] [place], [place] [address]      Labs: Basic Metabolic Panel: Recent Labs  Lab 10/09/22 0040 10/09/22 0055 10/10/22 0723 10/11/22 0135  NA 133* 135 134* 137  K 4.6 4.7 4.1 4.3  CL 106 106 107 108  CO2 23  --  21* 23  GLUCOSE 89 90 98 109*  BUN '18 23 19 23  '$ CREATININE 1.04* 0.90 1.67* 1.07*  CALCIUM 8.8*  --  8.1* 8.2*  MG  --   --  1.7 1.7   Liver Function Tests: Recent Labs  Lab 10/09/22 0040  AST 27  ALT 18  ALKPHOS 37*  BILITOT 0.8  PROT 7.3  ALBUMIN 3.1*   No results for input(s): "LIPASE", "AMYLASE" in the last 168 hours. No results for  input(s): "AMMONIA" in the last 168 hours. CBC: Recent Labs  Lab 10/09/22 0055 10/09/22 0350 10/10/22 0723 10/11/22 0135  WBC  --  5.0 4.6 4.3  HGB 12.9 11.6* 10.7* 9.6*  HCT 38.0 37.3 32.9* 29.7*  MCV  --  90.1 88.0 87.1  PLT  --  200 172 171   Cardiac Enzymes: No results for input(s): "CKTOTAL", "CKMB", "CKMBINDEX", "TROPONINI" in the last 168 hours. BNP: BNP (last 3 results) Recent Labs    10/09/22 0350  BNP 512.3*    ProBNP (last 3 results) No results for input(s): "PROBNP" in the last 8760 hours.  CBG: No results for input(s): "GLUCAP" in the last 168 hours.     Signed:  Fayrene Helper MD.  [facility] 10/11/2022, 2:56 PM

## 2022-10-11 NOTE — Progress Notes (Signed)
Rounding Note    Patient Name: Stacy Chapman Date of Encounter: 10/11/2022  [facility] Cardiologist: Mertie Moores, MD   Subjective   Laying comfortably in bed, asymptomatic.  Inpatient Medications    Scheduled Meds:  diltiazem  120 mg Oral Daily   flecainide  75 mg Oral BID   oseltamivir  30 mg Oral BID   warfarin  10 mg Oral ONCE-1600   Warfarin - Pharmacist Dosing Inpatient   Does not apply q1600   Continuous Infusions:  heparin 750 Units/hr (10/10/22 2139)   PRN Meds: acetaminophen, guaiFENesin, hydrALAZINE, ipratropium-albuterol, metoprolol tartrate, ondansetron (ZOFRAN) IV, oxyCODONE, senna-docusate, traZODone   Vital Signs    Vitals:   10/10/22 1227 10/10/22 1646 10/10/22 2115 10/11/22 0544  BP: 101/63 (!) 124/59 131/62 (!) 114/53  Pulse: 64 65 73 73  Resp: '16 17 18 18  '$ Temp: 97.9 F (36.6 C) 98.5 F (36.9 C) 98 F (36.7 C) 98.3 F (36.8 C)  TempSrc: Oral Oral Oral Oral  SpO2: 100% 100% 100% 99%  Weight:      Height:        Intake/Output Summary (Last 24 hours) at 10/11/2022 0832 Last data filed at 10/10/2022 2118 Gross per 24 hour  Intake 495.79 ml  Output --  Net 495.79 ml      10/09/2022   12:49 AM 07/11/2022   10:39 AM 05/01/2022   10:33 AM  Last 3 Weights  Weight (lbs) 220 lb 225 lb 6.4 oz 219 lb 12.8 oz  Weight (kg) 99.791 kg 102.241 kg 99.701 kg      Telemetry    Normal sinus rhythm in the 60s- Personally Reviewed  ECG    Prior EKG showed atrial fibrillation 125- Personally Reviewed  Physical Exam   GEN: No acute distress.   Neck: No JVD Cardiac: RRR, no murmurs, rubs, or gallops.  Respiratory: Clear to auscultation bilaterally. GI: Soft, nontender, non-distended  MS: No edema; No deformity. Neuro:  Nonfocal  Psych: Normal affect   Labs    High Sensitivity Troponin:  No results for input(s): "TROPONINIHS" in the last 720 hours.   Chemistry Recent Labs  Lab 10/09/22 0040 10/09/22 0055  10/10/22 0723 10/11/22 0135  NA 133* 135 134* 137  K 4.6 4.7 4.1 4.3  CL 106 106 107 108  CO2 23  --  21* 23  GLUCOSE 89 90 98 109*  BUN '18 23 19 23  '$ CREATININE 1.04* 0.90 1.67* 1.07*  CALCIUM 8.8*  --  8.1* 8.2*  MG  --   --  1.7 1.7  PROT 7.3  --   --   --   ALBUMIN 3.1*  --   --   --   AST 27  --   --   --   ALT 18  --   --   --   ALKPHOS 37*  --   --   --   BILITOT 0.8  --   --   --   GFRNONAA 59*  --  34* 57*  ANIONGAP 4*  --  6 6    Lipids No results for input(s): "CHOL", "TRIG", "HDL", "LABVLDL", "LDLCALC", "CHOLHDL" in the last 168 hours.  Hematology Recent Labs  Lab 10/09/22 0350 10/10/22 0723 10/11/22 0135  WBC 5.0 4.6 4.3  RBC 4.14 3.74* 3.41*  HGB 11.6* 10.7* 9.6*  HCT 37.3 32.9* 29.7*  MCV 90.1 88.0 87.1  MCH 28.0 28.6 28.2  MCHC 31.1 32.5 32.3  RDW 14.3 14.5 14.4  PLT 200 172 171   Thyroid No results for input(s): "TSH", "FREET4" in the last 168 hours.  BNP Recent Labs  Lab 10/09/22 0350  BNP 512.3*    DDimer No results for input(s): "DDIMER" in the last 168 hours.   Radiology    ECHOCARDIOGRAM COMPLETE  Result Date: 10/09/2022    ECHOCARDIOGRAM REPORT   Patient Name:   Stacy Chapman Date of Exam: 10/09/2022 Medical Rec #:  [id number]            Height:       67.0 in Accession #:    [id number]           Weight:       220.0 lb Date of Birth:  02/03/1956            BSA:          2.106 m Patient Age:    66 years             BP:           128/72 mmHg Patient Gender: F                    HR:           75 bpm. Exam Location:  Inpatient Procedure: 2D Echo and Intracardiac Opacification Agent Indications:    syncope  History:        Patient has prior history of Echocardiogram examinations, most                 recent 09/12/2019. Arrythmias:Atrial Fibrillation; Risk                 Factors:Hypertension, Diabetes and Dyslipidemia.  Sonographer:    Harvie Junior Referring Phys: [id number] [place]  1. Left ventricular ejection fraction,  by estimation, is 40 to 45%. The left ventricle has mildly decreased function. The left ventricle demonstrates global hypokinesis. There is mild concentric left ventricular hypertrophy. Left ventricular diastolic parameters are indeterminate.  2. Right ventricular systolic function is normal. The right ventricular size is normal. There is normal pulmonary artery systolic pressure.  3. The mitral valve is normal in structure. Mild mitral valve regurgitation. No evidence of mitral stenosis.  4. The aortic valve is normal in structure. Aortic valve regurgitation is trivial. No aortic stenosis is present.  5. The inferior vena cava is normal in size with greater than 50% respiratory variability, suggesting right atrial pressure of 3 mmHg. FINDINGS  Left Ventricle: Left ventricular ejection fraction, by estimation, is 40 to 45%. The left ventricle has mildly decreased function. The left ventricle demonstrates global hypokinesis. Definity contrast agent was given IV to delineate the left ventricular  endocardial borders. The left ventricular internal cavity size was normal in size. There is mild concentric left ventricular hypertrophy. Left ventricular diastolic parameters are indeterminate. Right Ventricle: The right ventricular size is normal. No increase in right ventricular wall thickness. Right ventricular systolic function is normal. There is normal pulmonary artery systolic pressure. The tricuspid regurgitant velocity is 1.95 m/s, and  with an assumed right atrial pressure of 3 mmHg, the estimated right ventricular systolic pressure is 69.4 mmHg. Left Atrium: Left atrial size was normal in size. Right Atrium: Right atrial size was normal in size. Pericardium: There is no evidence of pericardial effusion. Mitral Valve: The mitral valve is normal in structure. Mild mitral valve regurgitation. No evidence of mitral valve stenosis. Tricuspid Valve: The tricuspid valve is normal in structure. Tricuspid  valve  regurgitation is not demonstrated. No evidence of tricuspid stenosis. Aortic Valve: The aortic valve is normal in structure. Aortic valve regurgitation is trivial. Aortic regurgitation PHT measures 805 msec. No aortic stenosis is present. Aortic valve mean gradient measures 5.0 mmHg. Aortic valve peak gradient measures 8.3  mmHg. Aortic valve area, by VTI measures 2.15 cm. Pulmonic Valve: The pulmonic valve was normal in structure. Pulmonic valve regurgitation is not visualized. No evidence of pulmonic stenosis. Aorta: The aortic root is normal in size and structure. Venous: The inferior vena cava is normal in size with greater than 50% respiratory variability, suggesting right atrial pressure of 3 mmHg. IAS/Shunts: No atrial level shunt detected by color flow Doppler.  LEFT VENTRICLE PLAX 2D LVIDd:         4.65 cm     Diastology LVIDs:         3.50 cm     LV e' medial:    11.30 cm/s LV PW:         1.10 cm     LV E/e' medial:  9.7 LV IVS:        0.95 cm     LV e' lateral:   10.60 cm/s LVOT diam:     1.90 cm     LV E/e' lateral: 10.4 LV SV:         45 LV SV Index:   21 LVOT Area:     2.84 cm  LV Volumes (MOD) LV vol d, MOD A2C: 90.6 ml LV vol d, MOD A4C: 85.3 ml LV vol s, MOD A2C: 47.8 ml LV vol s, MOD A4C: 47.6 ml LV SV MOD A2C:     42.8 ml LV SV MOD A4C:     85.3 ml LV SV MOD BP:      40.0 ml RIGHT VENTRICLE RV Basal diam:  3.40 cm RV Mid diam:    2.50 cm RV S prime:     10.20 cm/s TAPSE (M-mode): 1.3 cm LEFT ATRIUM           Index        RIGHT ATRIUM           Index LA diam:      3.60 cm 1.71 cm/m   RA Area:     11.00 cm LA Vol (A4C): 35.0 ml 16.62 ml/m  RA Volume:   23.60 ml  11.21 ml/m  AORTIC VALVE                     PULMONIC VALVE AV Area (Vmax):    1.94 cm      PV Vmax:       1.15 m/s AV Area (Vmean):   1.91 cm      PV Peak grad:  5.3 mmHg AV Area (VTI):     2.15 cm AV Vmax:           144.00 cm/s AV Vmean:          109.000 cm/s AV VTI:            0.210 m AV Peak Grad:      8.3 mmHg AV Mean Grad:       5.0 mmHg LVOT Vmax:         98.30 cm/s LVOT Vmean:        73.300 cm/s LVOT VTI:          0.159 m LVOT/AV VTI ratio: 0.76 AI PHT:  805 msec  AORTA Ao Root diam: 2.80 cm Ao Asc diam:  2.60 cm MITRAL VALVE                TRICUSPID VALVE MV Area (PHT): 4.29 cm     TR Peak grad:   15.2 mmHg MV Decel Time: 177 msec     TR Vmax:        195.00 cm/s MR Peak grad: 84.6 mmHg MR Vmax:      460.00 cm/s   SHUNTS MV E velocity: 110.00 cm/s  Systemic VTI:  0.16 m MV A velocity: 47.60 cm/s   Systemic Diam: 1.90 cm MV E/A ratio:  2.31 Kardie Tobb DO Electronically signed by Berniece Salines DO Signature Date/Time: 10/09/2022/5:10:12 PM    Final     Cardiac Studies   ECHO 10/09/22  1. Left ventricular ejection fraction, by estimation, is 40 to 45%. The  left ventricle has mildly decreased function. The left ventricle  demonstrates global hypokinesis. There is mild concentric left ventricular  hypertrophy. Left ventricular diastolic  parameters are indeterminate.   2. Right ventricular systolic function is normal. The right ventricular  size is normal. There is normal pulmonary artery systolic pressure.   3. The mitral valve is normal in structure. Mild mitral valve  regurgitation. No evidence of mitral stenosis.   4. The aortic valve is normal in structure. Aortic valve regurgitation is  trivial. No aortic stenosis is present.   5. The inferior vena cava is normal in size with greater than 50%  respiratory variability, suggesting right atrial pressure of 3 mmHg.   Patient Profile     66 y.o. female with paroxysmal atrial fibrillation, ejection fraction now 40 to 45% from normal in 2020 with flu.  Assessment & Plan    Paroxysmal atrial fibrillation - We will go ahead and continue the flecainide despite her mild reduction in ejection fraction.  Hopefully as fluid improves and atrial fibrillation heart rate improves, ejection fraction will improve as well.  Reduction in ejection fraction may have been  from transient tachycardia with atrial fibrillation.  She is not showing any signs of adverse arrhythmias on telemetry.  Plan to repeat echocardiogram in the next month or 2. -Continue with diltiazem 120 mg daily.  States that previously she was only taking her diltiazem once daily -Had postconversion pauses of up to 4 seconds.  Try to maintain sinus rhythm with flecainide.  No further significant pauses.  No adverse arrhythmias, no ventricular tachycardia on telemetry.  Rare PVC.  Chronic anticoagulation - On Coumadin.  Goal INR between 2 and 3.  Prior history of TIA hypertension diabetes.  CHADSVASc score 6.  Unable to afford Xarelto.  Her most recent outpatient INR was 3.0 on 12/7.  On admission here she was 1.8.  Currently 1.5.  I am fine with her being discharged and resuming close follow-up with our Coumadin clinic on [facility] line.  I do not feel strongly that she needs a Lovenox bridge.  Flu/syncope - Likely dehydrated.  Creatinine improved after acute kidney injury injury. -Would not be unreasonable to place a ZIO monitor at home.  We will go ahead and send her out a 2-week nonlive monitor.  Thankfully she has upcoming appointment at atrial fibrillation clinic on January 9.  Okay with discharge from a cardiology perspective.    For questions or updates, please contact [place] Please consult [email or web address] for contact info under        Signed, Candee Furbish, MD  10/11/2022, 8:32 AM

## 2022-10-14 ENCOUNTER — Other Ambulatory Visit: Payer: Self-pay | Admitting: Nurse Practitioner

## 2022-10-14 ENCOUNTER — Ambulatory Visit: Payer: BC Managed Care – PPO | Attending: Cardiology

## 2022-10-14 ENCOUNTER — Ambulatory Visit: Payer: BC Managed Care – PPO

## 2022-10-14 ENCOUNTER — Other Ambulatory Visit: Payer: Self-pay | Admitting: Internal Medicine

## 2022-10-14 ENCOUNTER — Telehealth: Payer: Self-pay

## 2022-10-14 DIAGNOSIS — J4521 Mild intermittent asthma with (acute) exacerbation: Secondary | ICD-10-CM

## 2022-10-14 DIAGNOSIS — I480 Paroxysmal atrial fibrillation: Secondary | ICD-10-CM

## 2022-10-14 NOTE — Progress Notes (Unsigned)
Enrolled patient for a 14 Day Zio XT monitor to be mailed to patiens home   Dr Acie Fredrickson to read

## 2022-10-14 NOTE — Telephone Encounter (Signed)
Transition Care Management Follow-up Telephone Call Date of discharge and from where: 10/11/22 [facility]. Dx: A. Fib How have you been since you were rel/eased from the hospital? I'm feeling better. Any questions or concerns? No  Items Reviewed: Did the pt receive and understand the discharge instructions provided? Yes  Medications obtained and verified? Yes  Other? No  Any new allergies since your discharge? No  Dietary orders reviewed? No Do you have support at home? Yes   Home Care and Equipment/Supplies: Were home health services ordered? not applicable If so, what is the name of the agency? N/a  Has the agency set up a time to come to the patient's home? N/a Were any new equipment or medical supplies ordered?  Yes: heart monitor What is the name of the medical supply agency? unknown Were you able to get the supplies/equipment? yes Do you have any questions related to the use of the equipment or supplies? No  Functional Questionnaire: (I = Independent and D = Dependent) ADLs: I  Bathing/Dressing- I/  Meal Prep- I  Eating- I  Maintaining continence- I  Transferring/Ambulation- I  Managing Meds- I  Follow up appointments reviewed:  PCP Hospital f/u appt confirmed? Yes  Scheduled to see  Wilfred Lacy on 10/16/22 @ 9:00am. [facility] f/u appt confirmed? Yes Scheduled to see  Dr. Marlene Lard on 10/21/22 @ 10:00am. Are transportation arrangements needed? No  If their condition worsens, is the pt aware to call PCP or go to the Emergency Dept.? Yes Was the patient provided with contact information for the PCP's office or ED? Yes Was to pt encouraged to call back with questions or concerns? Yes  Angeline Slim, RN, BSN RN Clinical Supervisor [facility] [employer]

## 2022-10-14 NOTE — Telephone Encounter (Signed)
Called and spoke to pt who stated that she would be able to come in today to have INR checked. Appt made.

## 2022-10-15 NOTE — Telephone Encounter (Signed)
Patient is scheduled with Dr. Lake Bells on 11/14/22 at 1030 for pulmonary consult.

## 2022-10-16 ENCOUNTER — Encounter: Payer: Self-pay | Admitting: Nurse Practitioner

## 2022-10-16 ENCOUNTER — Ambulatory Visit (INDEPENDENT_AMBULATORY_CARE_PROVIDER_SITE_OTHER): Payer: BC Managed Care – PPO | Admitting: Nurse Practitioner

## 2022-10-16 VITALS — BP 144/74 | HR 74 | Temp 97.9°F | Ht 67.0 in | Wt 229.4 lb

## 2022-10-16 DIAGNOSIS — E1169 Type 2 diabetes mellitus with other specified complication: Secondary | ICD-10-CM | POA: Diagnosis not present

## 2022-10-16 DIAGNOSIS — I4891 Unspecified atrial fibrillation: Secondary | ICD-10-CM

## 2022-10-16 DIAGNOSIS — N289 Disorder of kidney and ureter, unspecified: Secondary | ICD-10-CM | POA: Diagnosis not present

## 2022-10-16 DIAGNOSIS — I480 Paroxysmal atrial fibrillation: Secondary | ICD-10-CM

## 2022-10-16 DIAGNOSIS — D649 Anemia, unspecified: Secondary | ICD-10-CM | POA: Diagnosis not present

## 2022-10-16 DIAGNOSIS — R918 Other nonspecific abnormal finding of lung field: Secondary | ICD-10-CM

## 2022-10-16 DIAGNOSIS — R197 Diarrhea, unspecified: Secondary | ICD-10-CM | POA: Diagnosis not present

## 2022-10-16 LAB — HEMOGLOBIN A1C: Hgb A1c MFr Bld: 6.5 % (ref 4.6–6.5)

## 2022-10-16 LAB — COMPREHENSIVE METABOLIC PANEL
ALT: 22 U/L (ref 0–35)
AST: 17 U/L (ref 0–37)
Albumin: 3.5 g/dL (ref 3.5–5.2)
Alkaline Phosphatase: 36 U/L — ABNORMAL LOW (ref 39–117)
BUN: 20 mg/dL (ref 6–23)
CO2: 29 mEq/L (ref 19–32)
Calcium: 9.2 mg/dL (ref 8.4–10.5)
Chloride: 103 mEq/L (ref 96–112)
Creatinine, Ser: 0.87 mg/dL (ref 0.40–1.20)
GFR: 69.59 mL/min (ref >60.00)
Glucose, Bld: 78 mg/dL (ref 70–99)
Potassium: 3.8 mEq/L (ref 3.5–5.1)
Sodium: 138 mEq/L (ref 135–145)
Total Bilirubin: 0.4 mg/dL (ref 0.2–1.2)
Total Protein: 7.3 g/dL (ref 6.0–8.3)

## 2022-10-16 LAB — CBC WITH DIFFERENTIAL/PLATELET
Basophils Absolute: 0 10*3/uL (ref 0.0–0.1)
Basophils Relative: 0.1 % (ref 0.0–3.0)
Eosinophils Absolute: 0 10*3/uL (ref 0.0–0.7)
Eosinophils Relative: 0.1 % (ref 0.0–5.0)
HCT: 36.6 % (ref 36.0–46.0)
Hemoglobin: 11.7 g/dL — ABNORMAL LOW (ref 12.0–15.0)
Lymphocytes Relative: 20.9 % (ref 12.0–46.0)
Lymphs Abs: 1.8 10*3/uL (ref 0.7–4.0)
MCHC: 31.9 g/dL (ref 30.0–36.0)
MCV: 87 fl (ref 78.0–100.0)
Monocytes Absolute: 0.9 10*3/uL (ref 0.1–1.0)
Monocytes Relative: 10 % (ref 3.0–12.0)
Neutro Abs: 6 10*3/uL (ref 1.4–7.7)
Neutrophils Relative %: 68.9 % (ref 43.0–77.0)
Platelets: 277 10*3/uL (ref 150.0–400.0)
RBC: 4.2 Mil/uL (ref 3.87–5.11)
RDW: 14.3 % (ref 11.5–15.5)
WBC: 8.7 10*3/uL (ref 4.0–10.5)

## 2022-10-16 NOTE — Progress Notes (Signed)
Established Patient Visit  Patient: Stacy Chapman   DOB: 1955-10-26   67 y.o. Female  MRN: [id number] Visit Date: 10/16/2022  Subjective:    Chief Complaint  Patient presents with   [facility] f/u, says things have been getting better but still gets winded  Needs work note  Requesting records for eye exam    HPI Accompanied by daughter today Hospitalization: 12/28-12/30 TCM call completed 10/14/2022 She has the following upcoming appts: pulmonology 11/16/2022 due to abnormal CT chest-pulmonary nodules and coumadin clinic 10/17/2022- last INR at 1.5. Today she reports persistent fatigue, SOB with exertion, and intermittent diarrhea. No fever, no chest pain, no palpitations, no syncope. She has complete oral prednisone x4days and tamiflu x5days. She needs work note extended for another week. Diarrhea of presumed infectious origin 1loose stool daily Ordered GI pathogen today  Type 2 diabetes mellitus with other specified complication, without long-term current use of insulin (HCC) Repeat hgbA1c: 6.5% Advised about need for low carb/low sugar diet Repeat in 33month  Atrial fibrillation with RVR (HCC) Rate controlled with cardizem Coumadin not therapeutic. Appt with coumadin clinic tomorrow  Pulmonary nodules Daily SOB with exertion, improves with use of albuterol inhaler. Clears lung sounds today. Declined MRI at this time. She will wait for appt with pulmonology 11/16/2021   Reviewed medical, surgical, and social history today  Medications: Outpatient Medications Prior to Visit  Medication Sig   acetaminophen (TYLENOL) 500 MG tablet Take 1,000 mg by mouth as needed for moderate pain.   albuterol (VENTOLIN HFA) 108 (90 Base) MCG/ACT inhaler Need office visit for addiitonal refills   azelastine (ASTELIN) 0.1 % nasal spray Place 1 spray into both nostrils 2 (two) times daily. Use in each nostril as directed   Biotin 2.5 MG TABS Take 1 tablet by  mouth daily.   diltiazem (CARDIZEM CD) 120 MG 24 hr capsule TAKE 1 CAPSULE BY MOUTH DAILY (Patient taking differently: Take 120 mg by mouth daily. TAKE 1 CAPSULE BY MOUTH DAILY)   ferrous gluconate (FERGON) 324 MG tablet Take 324 mg by mouth daily with breakfast.   flecainide (TAMBOCOR) 50 MG tablet Take 1.5 tablets (75 mg total) by mouth 2 (two) times daily.   montelukast (SINGULAIR) 10 MG tablet TAKE 1 TABLET(10 MG) BY MOUTH AT BEDTIME (Patient taking differently: Take 10 mg by mouth at bedtime.)   Multiple Vitamin (MULTIVITAMIN) capsule Take 1 capsule by mouth daily.   rosuvastatin (CRESTOR) 10 MG tablet Take 1 tablet (10 mg total) by mouth daily.   warfarin (COUMADIN) 5 MG tablet TAKE 1 TABLET TO 2 TABLETS BY MOUTH DAILY AS DIRECTED BY COUMADIN CLINIC (Patient taking differently: Take 5-10 mg by mouth See admin instructions. Taking 2 tabs ( 10 mg) on Sunday & Thursday. All other days taking 5 mg daily)   [DISCONTINUED] predniSONE (DELTASONE) 20 MG tablet Take 2 tablets (40 mg total) by mouth daily with breakfast for 4 days.   fluticasone (FLONASE) 50 MCG/ACT nasal spray SHAKE LIQUID AND USE 2 SPRAYS IN EACH NOSTRIL DAILY (Patient not taking: Reported on 10/10/2022)   furosemide (LASIX) 20 MG tablet TAKE 1 TABLET BY MOUTH DAILY AS NEEDED FOR SWELLING OR WEIGHT GAIN (Patient not taking: Reported on 10/16/2022)   No facility-administered medications prior to visit.   Reviewed past medical and social history.   ROS per HPI above  Last CBC Lab Results  Component Value Date  WBC 8.7 10/16/2022   HGB 11.7 (L) 10/16/2022   HCT 36.6 10/16/2022   MCV 87.0 10/16/2022   MCH 28.2 10/11/2022   RDW 14.3 10/16/2022   PLT 277.0 10/21/3233   Last metabolic panel Lab Results  Component Value Date   GLUCOSE 78 10/16/2022   NA 138 10/16/2022   K 3.8 10/16/2022   CL 103 10/16/2022   CO2 29 10/16/2022   BUN 20 10/16/2022   CREATININE 0.87 10/16/2022   GFRNONAA 57 (L) 10/11/2022   CALCIUM 9.2  10/16/2022   PROT 7.3 10/16/2022   ALBUMIN 3.5 10/16/2022   BILITOT 0.4 10/16/2022   ALKPHOS 36 (L) 10/16/2022   AST 17 10/16/2022   ALT 22 10/16/2022   ANIONGAP 6 10/11/2022   Last hemoglobin A1c Lab Results  Component Value Date   HGBA1C 6.5 10/16/2022   Last thyroid functions Lab Results  Component Value Date   TSH 1.27 07/18/2021      Objective:  BP (!) 144/74 (BP Location: Left Arm, Patient Position: Sitting, Cuff Size: Normal)   Pulse 74   Temp 97.9 F (36.6 C) (Temporal)   Ht '5\' 7"'$  (1.702 m)   Wt 229 lb 6.4 oz (104.1 kg)   SpO2 99%   BMI 35.93 kg/m      Physical Exam Vitals and nursing note reviewed.  Constitutional:      General: She is not in acute distress. Cardiovascular:     Rate and Rhythm: Normal rate. Rhythm irregular.     Pulses: Normal pulses.     Heart sounds: Normal heart sounds.  Pulmonary:     Effort: Pulmonary effort is normal.     Breath sounds: Normal breath sounds.  Musculoskeletal:     Right lower leg: No edema.     Left lower leg: No edema.  Neurological:     Mental Status: She is alert and oriented to person, place, and time.  Psychiatric:        Mood and Affect: Mood normal.        Behavior: Behavior normal.        Thought Content: Thought content normal.     Results for orders placed or performed in visit on 10/16/22  CBC with Differential/Platelet  Result Value Ref Range   WBC 8.7 4.0 - 10.5 K/uL   RBC 4.20 3.87 - 5.11 Mil/uL   Hemoglobin 11.7 (L) 12.0 - 15.0 g/dL   HCT 36.6 36.0 - 46.0 %   MCV 87.0 78.0 - 100.0 fl   MCHC 31.9 30.0 - 36.0 g/dL   RDW 14.3 11.5 - 15.5 %   Platelets 277.0 150.0 - 400.0 K/uL   Neutrophils Relative % 68.9 43.0 - 77.0 %   Lymphocytes Relative 20.9 12.0 - 46.0 %   Monocytes Relative 10.0 3.0 - 12.0 %   Eosinophils Relative 0.1 0.0 - 5.0 %   Basophils Relative 0.1 0.0 - 3.0 %   Neutro Abs 6.0 1.4 - 7.7 K/uL   Lymphs Abs 1.8 0.7 - 4.0 K/uL   Monocytes Absolute 0.9 0.1 - 1.0 K/uL    Eosinophils Absolute 0.0 0.0 - 0.7 K/uL   Basophils Absolute 0.0 0.0 - 0.1 K/uL  Comprehensive metabolic panel  Result Value Ref Range   Sodium 138 135 - 145 mEq/L   Potassium 3.8 3.5 - 5.1 mEq/L   Chloride 103 96 - 112 mEq/L   CO2 29 19 - 32 mEq/L   Glucose, Bld 78 70 - 99 mg/dL   BUN 20 6 -  23 mg/dL   Creatinine, Ser 0.87 0.40 - 1.20 mg/dL   Total Bilirubin 0.4 0.2 - 1.2 mg/dL   Alkaline Phosphatase 36 (L) 39 - 117 U/L   AST 17 0 - 37 U/L   ALT 22 0 - 35 U/L   Total Protein 7.3 6.0 - 8.3 g/dL   Albumin 3.5 3.5 - 5.2 g/dL   GFR 69.59 >60.00 mL/min   Calcium 9.2 8.4 - 10.5 mg/dL  Hemoglobin A1c  Result Value Ref Range   Hgb A1c MFr Bld 6.5 4.6 - 6.5 %  HM DIABETES EYE EXAM  Result Value Ref Range   HM Diabetic Eye Exam No Retinopathy No Retinopathy      Assessment & Plan:    Problem List Items Addressed This Visit       Cardiovascular and Mediastinum   Atrial fibrillation with RVR ([place])    Rate controlled with cardizem Coumadin not therapeutic. Appt with coumadin clinic tomorrow        Respiratory   Pulmonary nodules    Daily SOB with exertion, improves with use of albuterol inhaler. Clears lung sounds today. Declined MRI at this time. She will wait for appt with pulmonology 11/16/2021        Digestive   Diarrhea of presumed infectious origin - Primary    1loose stool daily Ordered GI pathogen today      Relevant Orders   GI pathogen panel by PCR, stool     Endocrine   Type 2 diabetes mellitus with other specified complication, without long-term current use of insulin (HCC)    Repeat hgbA1c: 6.5% Advised about need for low carb/low sugar diet Repeat in 61month      Relevant Orders   Hemoglobin A1c (Completed)   Other Visit Diagnoses     Renal insufficiency       Relevant Orders   Comprehensive metabolic panel (Completed)   Anemia, unspecified type       Relevant Orders   CBC with Differential/Platelet (Completed)      Return in about 3  months (around 01/15/2023) for DM, HTN, hyperlipidemia (fasting).     CWilfred Lacy NP

## 2022-10-16 NOTE — Assessment & Plan Note (Signed)
>>  ASSESSMENT AND PLAN FOR ATRIAL FIBRILLATION WITH RVR (HCC) WRITTEN ON 10/16/2022  3:23 PM BY NCHE, CHARLOTTE LUM, NP  Rate controlled with cardizem  Coumadin  not therapeutic. Appt with coumadin  clinic tomorrow

## 2022-10-16 NOTE — Patient Instructions (Signed)
Go to lab for blood draw and stool kit. Maintain appt with coumadin clinic and pulmonology. 

## 2022-10-16 NOTE — Assessment & Plan Note (Signed)
Rate controlled with cardizem Coumadin not therapeutic. Appt with coumadin clinic tomorrow

## 2022-10-16 NOTE — Assessment & Plan Note (Signed)
Daily SOB with exertion, improves with use of albuterol inhaler. Clears lung sounds today. Declined MRI at this time. She will wait for appt with pulmonology 11/16/2021

## 2022-10-16 NOTE — Assessment & Plan Note (Addendum)
Repeat hgbA1c: 6.5% Advised about need for low carb/low sugar diet Repeat in 60month

## 2022-10-16 NOTE — Assessment & Plan Note (Signed)
1loose stool daily Ordered GI pathogen today

## 2022-10-17 ENCOUNTER — Ambulatory Visit: Payer: BC Managed Care – PPO | Attending: Internal Medicine | Admitting: *Deleted

## 2022-10-17 DIAGNOSIS — I480 Paroxysmal atrial fibrillation: Secondary | ICD-10-CM | POA: Diagnosis not present

## 2022-10-17 DIAGNOSIS — Z5181 Encounter for therapeutic drug level monitoring: Secondary | ICD-10-CM

## 2022-10-17 DIAGNOSIS — I4891 Unspecified atrial fibrillation: Secondary | ICD-10-CM | POA: Diagnosis not present

## 2022-10-17 LAB — POCT INR: INR: 4.8 — AB (ref 2.0–3.0)

## 2022-10-17 NOTE — Patient Instructions (Signed)
Description   Do not take any warfarin tomorrow (already taken today's dose) and no warfarin Sunday then continue taking 1 tablet daily except 2 tablets only on Sundays and Thursdays. Recheck INR in 1 week.  Resume normal green intake then be consistent with your leafy veggies and fruit smoothies (2-3 servings per week).  Coumadin Clinic [phone]

## 2022-10-18 LAB — GASTROINTESTINAL PATHOGEN PNL
CampyloBacter Group: NOT DETECTED
Norovirus GI/GII: NOT DETECTED
Rotavirus A: NOT DETECTED
Salmonella species: NOT DETECTED
Shiga Toxin 1: NOT DETECTED
Shiga Toxin 2: NOT DETECTED
Shigella Species: NOT DETECTED
Vibrio Group: NOT DETECTED
Yersinia enterocolitica: NOT DETECTED

## 2022-10-21 ENCOUNTER — Ambulatory Visit (HOSPITAL_COMMUNITY): Payer: BC Managed Care – PPO | Admitting: Physician Assistant

## 2022-10-23 ENCOUNTER — Ambulatory Visit: Payer: BC Managed Care – PPO | Attending: Internal Medicine | Admitting: *Deleted

## 2022-10-23 DIAGNOSIS — I480 Paroxysmal atrial fibrillation: Secondary | ICD-10-CM | POA: Diagnosis not present

## 2022-10-23 DIAGNOSIS — I4891 Unspecified atrial fibrillation: Secondary | ICD-10-CM | POA: Diagnosis not present

## 2022-10-23 DIAGNOSIS — Z5181 Encounter for therapeutic drug level monitoring: Secondary | ICD-10-CM

## 2022-10-23 LAB — POCT INR: INR: 1.3 — AB (ref 2.0–3.0)

## 2022-10-23 NOTE — Patient Instructions (Signed)
Description   Today take 2.5 tablets of warfarin and tomorrow take 1.5 tablets of warfarin then continue taking 1 tablet daily except 2 tablets only on Sundays and Thursdays. Recheck INR in 1 week.  Resume normal green intake then be consistent with your leafy veggies and fruit smoothies (2-3 servings per week).  Coumadin Clinic [phone]

## 2022-10-30 ENCOUNTER — Ambulatory Visit: Payer: BC Managed Care – PPO | Attending: Internal Medicine

## 2022-10-30 DIAGNOSIS — Z5181 Encounter for therapeutic drug level monitoring: Secondary | ICD-10-CM

## 2022-10-30 DIAGNOSIS — I480 Paroxysmal atrial fibrillation: Secondary | ICD-10-CM | POA: Diagnosis not present

## 2022-10-30 LAB — POCT INR: INR: 2.2 (ref 2.0–3.0)

## 2022-10-30 NOTE — Patient Instructions (Signed)
Description   Continue taking 1 tablet daily except 2 tablets only on Sundays and Thursdays.  Recheck INR in 2 weeks.   Resume normal green intake then be consistent with your leafy veggies and fruit smoothies (2-3 servings per week).  Coumadin Clinic [phone]

## 2022-11-03 ENCOUNTER — Encounter (HOSPITAL_COMMUNITY): Payer: Self-pay | Admitting: Physician Assistant

## 2022-11-03 ENCOUNTER — Ambulatory Visit (HOSPITAL_COMMUNITY)
Admission: RE | Admit: 2022-11-03 | Discharge: 2022-11-03 | Disposition: A | Discharge status: Home / Self Care | Payer: BC Managed Care – PPO | Source: Ambulatory Visit | Attending: Physician Assistant | Admitting: Physician Assistant

## 2022-11-03 VITALS — BP 134/72 | HR 83 | Ht 67.0 in | Wt 230.8 lb

## 2022-11-03 DIAGNOSIS — Z79899 Other long term (current) drug therapy: Secondary | ICD-10-CM | POA: Diagnosis not present

## 2022-11-03 DIAGNOSIS — I5022 Chronic systolic (congestive) heart failure: Secondary | ICD-10-CM | POA: Insufficient documentation

## 2022-11-03 DIAGNOSIS — Z7901 Long term (current) use of anticoagulants: Secondary | ICD-10-CM | POA: Diagnosis not present

## 2022-11-03 DIAGNOSIS — I4819 Other persistent atrial fibrillation: Secondary | ICD-10-CM | POA: Insufficient documentation

## 2022-11-03 DIAGNOSIS — Z8673 Personal history of transient ischemic attack (TIA), and cerebral infarction without residual deficits: Secondary | ICD-10-CM | POA: Insufficient documentation

## 2022-11-03 DIAGNOSIS — D6869 Other thrombophilia: Secondary | ICD-10-CM | POA: Diagnosis not present

## 2022-11-03 DIAGNOSIS — E669 Obesity, unspecified: Secondary | ICD-10-CM | POA: Diagnosis not present

## 2022-11-03 DIAGNOSIS — Z6836 Body mass index (BMI) 36.0-36.9, adult: Secondary | ICD-10-CM | POA: Insufficient documentation

## 2022-11-03 DIAGNOSIS — E118 Type 2 diabetes mellitus with unspecified complications: Secondary | ICD-10-CM | POA: Insufficient documentation

## 2022-11-03 DIAGNOSIS — I110 Hypertensive heart disease with heart failure: Secondary | ICD-10-CM | POA: Insufficient documentation

## 2022-11-03 NOTE — Progress Notes (Signed)
Primary Care Physician: [name], NP Primary Cardiologist: none Primary Electrophysiologist: Dr Rayann Heman Referring Physician: [facility]   Stacy Chapman is a 67 y.o. female with a history of HTN, DM, CVA, asthma, and paroxysmal atrial fibrillation who presents for follow up in the [facility].  The patient was initially diagnosed with atrial fibrillation remotely and had previously been on Xarelto per ER report. More recently, patient was seen at the ER 08/21/19 with symptoms of SOB and generalized weakness and was found by EMS to be in afib. She did not undergo DCCV as onset of afib was unclear and she was not on anticoagulation. She was discharged on Xarelto for a CHADS2VASC score of 5. Patient reports that she had two close family members pass away the day before her symptom onset. She does admit to alcohol use. She denies significant snoring. Patient wore a Zio patch which showed 100% afib burden which was rate controlled overall. She has been maintained on flecainide.   Patient was admitted 09/2022 after a syncopal event. She was found to be going in and out of afib, having post termination pauses up to 4 seconds. This was in the setting of acute influenza infection. Patient also admitted that she had been out of diltiazem for several days and had only been taking the flecainide once daily. Her medication was resumed and prescribed doses at discharge. Echo showed mildly reduced EF 40-45%. She was also given a two week Zio monitor to evaluate her arrhythmia burden and for pauses. Syncope was felt to be due to dehydration/acute illness.   On follow up today, patient reports that she is feeling much better. She just completed the monitor and mailed it back last week. He is in SR today. No bleeding issues on anticoagulation.   Today, she denies symptoms of palpitations, chest pain, orthopnea, PND, lower extremity edema, dizziness, presyncope, syncope,  snoring, daytime somnolence, bleeding, or neurologic sequela. The patient is tolerating medications without difficulties and is otherwise without complaint today.    Atrial Fibrillation Risk Factors:  she does not have symptoms or diagnosis of sleep apnea. she does not have a history of rheumatic fever. she does have a history of alcohol use. The patient does not have a history of early familial atrial fibrillation or other arrhythmias.  she has a BMI of Body mass index is 36.15 kg/m.Marland Kitchen Filed Weights   11/03/22 1102  Weight: 104.7 kg    Family History  Problem Relation Age of Onset   Stroke Father 24       death   Hypertension Father    Dementia Sister    Diabetes Sister    Eczema Sister    Hypertension Brother    Stroke Brother 73       death   Cancer Brother        lung   Cancer Paternal Uncle        unknown   Dementia Sister    Colon cancer Neg Hx    Colon polyps Neg Hx    Stomach cancer Neg Hx    Rectal cancer Neg Hx    Esophageal cancer Neg Hx      Atrial Fibrillation Management history:  Previous antiarrhythmic drugs: flecainide Previous cardioversions: none Previous ablations: none CHADS2VASC score: 5 Anticoagulation history: Xarelto, warfarin    Past Medical History:  Diagnosis Date   Anemia    Arthritis    Asthma    Diabetes ([place])  Essential hypertension    Morbid obesity (HCC)    Persistent atrial fibrillation ([place])    Stroke ([place]) 2018   TIA   Past Surgical History:  Procedure Laterality Date   KNEE ARTHROSCOPY     TONSILLECTOMY     TUBAL LIGATION  1991    Current Outpatient Medications  Medication Sig Dispense Refill   acetaminophen (TYLENOL) 500 MG tablet Take 1,000 mg by mouth as needed for moderate pain.     albuterol (VENTOLIN HFA) 108 (90 Base) MCG/ACT inhaler Need office visit for addiitonal refills 6.7 g 0   azelastine (ASTELIN) 0.1 % nasal spray Place 1 spray into both nostrils 2 (two) times daily. Use in each nostril as  directed 90 mL 0   Biotin 2.5 MG TABS Take 1 tablet by mouth daily.     diltiazem (CARDIZEM CD) 120 MG 24 hr capsule TAKE 1 CAPSULE BY MOUTH DAILY 90 capsule 2   ferrous gluconate (FERGON) 324 MG tablet Take 324 mg by mouth daily with breakfast.     flecainide (TAMBOCOR) 50 MG tablet Take 1.5 tablets (75 mg total) by mouth 2 (two) times daily. 270 tablet 2   fluticasone (FLONASE) 50 MCG/ACT nasal spray SHAKE LIQUID AND USE 2 SPRAYS IN EACH NOSTRIL DAILY 16 g 1   furosemide (LASIX) 20 MG tablet TAKE 1 TABLET BY MOUTH DAILY AS NEEDED FOR SWELLING OR WEIGHT GAIN 30 tablet 5   montelukast (SINGULAIR) 10 MG tablet TAKE 1 TABLET(10 MG) BY MOUTH AT BEDTIME 90 tablet 0   Multiple Vitamin (MULTIVITAMIN) capsule Take 1 capsule by mouth daily.     rosuvastatin (CRESTOR) 10 MG tablet Take 1 tablet (10 mg total) by mouth daily. 90 tablet 3   warfarin (COUMADIN) 5 MG tablet TAKE 1 TABLET TO 2 TABLETS BY MOUTH DAILY AS DIRECTED BY COUMADIN CLINIC (Patient taking differently: Take 5-10 mg by mouth See admin instructions. Taking 2 tabs ( 10 mg) on Sunday & Thursday. All other days taking 5 mg daily) 120 tablet 0   No current facility-administered medications for this encounter.    Allergies  Allergen Reactions   Lisinopril Cough    Social History   Socioeconomic History   Marital status: Divorced    Spouse name: Not on file   Number of children: Not on file   Years of education: Not on file   Highest education level: Not on file  Occupational History   Not on file  Tobacco Use   Smoking status: Never   Smokeless tobacco: Never   Tobacco comments:    Never smoke 05/01/22  Vaping Use   Vaping Use: Never used  Substance and Sexual Activity   Alcohol use: Not Currently    Alcohol/week: 6.0 standard drinks of alcohol    Types: 1 Glasses of wine, 5 Standard drinks or equivalent per week    Comment: Drinks multiple times a week   Drug use: No   Sexual activity: Yes    Birth control/protection:  Post-menopausal, Surgical    Comment: s/p tubal ligation  Other Topics Concern   Not on file  Social History Narrative   Lives in [place] and works in a [employer]   Social Determinants of Health   Financial Resource Strain: Not on file  Food Insecurity: Unknown (10/09/2022)   Hunger Vital Sign    Worried About Running Out of Food in the Last Year: Patient refused    Ran Out of Food in the Last Year: Patient refused  Transportation Needs:  No Transportation Needs (10/09/2022)   PRAPARE - Hydrologist (Medical): No    Lack of Transportation (Non-Medical): No  Physical Activity: Not on file  Stress: Not on file  Social Connections: Not on file  Intimate Partner Violence: Not At Risk (10/09/2022)   Humiliation, Afraid, Rape, and Kick questionnaire    Fear of Current or Ex-Partner: No    Emotionally Abused: No    Physically Abused: No    Sexually Abused: No     ROS- All systems are reviewed and negative except as per the HPI above.  Physical Exam: Vitals:   11/03/22 1102  BP: 134/72  Pulse: 83  Weight: 104.7 kg  Height: '5\' 7"'$  (1.702 m)    GEN- The patient is a well appearing obese female, alert and oriented x 3 today.   HEENT-head normocephalic, atraumatic, sclera clear, conjunctiva pink, hearing intact, trachea midline. Lungs- Clear to ausculation bilaterally, normal work of breathing Heart- Regular rate and rhythm, no murmurs, rubs or gallops  GI- soft, NT, ND, + BS Extremities- no clubbing, cyanosis, or edema MS- no significant deformity or atrophy Skin- no rash or lesion Psych- euthymic mood, full affect Neuro- strength and sensation are intact   Wt Readings from Last 3 Encounters:  11/03/22 104.7 kg  10/16/22 104.1 kg  10/09/22 99.8 kg    EKG today demonstrates  SR Vent. rate 83 BPM PR interval 150 ms QRS duration 80 ms QT/QTcB 364/427 ms  Echo 10/09/22  1. Left ventricular ejection fraction, by estimation, is 40 to 45%.  The  left ventricle has mildly decreased function. The left ventricle  demonstrates global hypokinesis. There is mild concentric left ventricular  hypertrophy. Left ventricular diastolic  parameters are indeterminate.   2. Right ventricular systolic function is normal. The right ventricular  size is normal. There is normal pulmonary artery systolic pressure.   3. The mitral valve is normal in structure. Mild mitral valve  regurgitation. No evidence of mitral stenosis.   4. The aortic valve is normal in structure. Aortic valve regurgitation is  trivial. No aortic stenosis is present.   5. The inferior vena cava is normal in size with greater than 50%  respiratory variability, suggesting right atrial pressure of 3 mmHg.    Epic records are reviewed at length today  CHA2DS2-VASc Score = 7  The patient's score is based upon: CHF History: 1 HTN History: 1 Diabetes History: 1 Stroke History: 2 Vascular Disease History: 0 Age Score: 1 Gender Score: 1        ASSESSMENT AND PLAN: 1. Persistent Atrial Fibrillation (ICD10:  I48.19) The patient's CHA2DS2-VASc score is 7, indicating a 11.2% annual risk of stroke.   Patient appears to be maintaining SR. Will see what monitor shows. Hopefully, by treating afib she will not have issues with post termination pauses.  Continue flecainide 75 mg BID Continue diltiazem 120 mg daily Mildly reduced EF felt to be related to afib, will recheck echo. If her EF remains decreased, will need to stop flecainide and consider alternate rhythm control. She would favor ablation over medication.  Continue warfarin   2. Secondary Hypercoagulable State (ICD10:  D68.69) The patient is at significant risk for stroke/thromboembolism based upon her CHA2DS2-VASc Score of 7.  Continue Warfarin (Coumadin).   3. Obesity Body mass index is 36.15 kg/m. Lifestyle modification was discussed and encouraged including regular physical activity and weight reduction.  4.  HTN Stable, no changes today.  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 Will repeat echo in one month.    Follow up in the AF clinic after her echo to discuss results and options.    [facility] [address] [place], [place] [address] [phone] 11/03/2022 1:10 PM

## 2022-11-05 ENCOUNTER — Other Ambulatory Visit: Payer: Self-pay | Admitting: Nurse Practitioner

## 2022-11-05 ENCOUNTER — Telehealth: Payer: Self-pay | Admitting: Cardiovascular Disease

## 2022-11-05 DIAGNOSIS — Z1231 Encounter for screening mammogram for malignant neoplasm of breast: Secondary | ICD-10-CM

## 2022-11-05 NOTE — Telephone Encounter (Signed)
End of Service Monitor call received from [place] at [place], [place] am on 11/05/2022;  Monitor was worn by Pt from 10/16/2022 to 10/30/2022.  Per Geni Bers,  360 Sinus pauses were recorded, 8.5 second pause being the longest.   One run of Ventricular Tachycardia was captured, and Pt had a 19% Atrial Fibrillation burden.    Report was obtained / printed by Markus Daft;  Dr. Marlou Porch ordered the [facility] monitor, but Dr. Mertie Moores is the patients primary cardiologist.  After reviewing data received, report taken to Dr. Acie Fredrickson for review / orders.  Per Dr. Acie Fredrickson, Pt will need a stat pacemaker; Dr. Acie Fredrickson stated he will contact EP to initiate this plan of care.

## 2022-11-05 NOTE — Telephone Encounter (Signed)
Abnormal cardiac results. Please advise

## 2022-11-06 ENCOUNTER — Ambulatory Visit
Admission: RE | Admit: 2022-11-06 | Discharge: 2022-11-06 | Disposition: A | Discharge status: Home / Self Care | Payer: BC Managed Care – PPO | Source: Ambulatory Visit | Attending: Nurse Practitioner | Admitting: Nurse Practitioner

## 2022-11-06 DIAGNOSIS — Z1231 Encounter for screening mammogram for malignant neoplasm of breast: Secondary | ICD-10-CM

## 2022-11-11 ENCOUNTER — Other Ambulatory Visit: Payer: Self-pay | Admitting: Nurse Practitioner

## 2022-11-11 DIAGNOSIS — J4521 Mild intermittent asthma with (acute) exacerbation: Secondary | ICD-10-CM

## 2022-11-12 MED ORDER — MONTELUKAST SODIUM 10 MG PO TABS: 10.0000 mg | ORAL_TABLET | Freq: Every day | ORAL | 0 refills | Status: DC

## 2022-11-12 NOTE — Telephone Encounter (Signed)
Chart supports Rx Last OV: 10/2022 Next OV: 01/2023

## 2022-11-13 ENCOUNTER — Ambulatory Visit: Payer: BC Managed Care – PPO

## 2022-11-14 ENCOUNTER — Encounter: Payer: Self-pay | Admitting: Pulmonary Disease

## 2022-11-14 ENCOUNTER — Ambulatory Visit (INDEPENDENT_AMBULATORY_CARE_PROVIDER_SITE_OTHER): Payer: BC Managed Care – PPO | Admitting: Pulmonary Disease

## 2022-11-14 VITALS — BP 130/72 | HR 75 | Ht 67.0 in | Wt 230.0 lb

## 2022-11-14 DIAGNOSIS — D869 Sarcoidosis, unspecified: Secondary | ICD-10-CM

## 2022-11-14 DIAGNOSIS — J849 Interstitial pulmonary disease, unspecified: Secondary | ICD-10-CM | POA: Diagnosis not present

## 2022-11-14 DIAGNOSIS — J398 Other specified diseases of upper respiratory tract: Secondary | ICD-10-CM

## 2022-11-14 DIAGNOSIS — R918 Other nonspecific abnormal finding of lung field: Secondary | ICD-10-CM | POA: Diagnosis not present

## 2022-11-14 DIAGNOSIS — D860 Sarcoidosis of lung: Secondary | ICD-10-CM | POA: Diagnosis not present

## 2022-11-14 DIAGNOSIS — R911 Solitary pulmonary nodule: Secondary | ICD-10-CM

## 2022-11-14 DIAGNOSIS — R590 Localized enlarged lymph nodes: Secondary | ICD-10-CM

## 2022-11-14 DIAGNOSIS — R222 Localized swelling, mass and lump, trunk: Secondary | ICD-10-CM

## 2022-11-14 NOTE — Patient Instructions (Signed)
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

## 2022-11-14 NOTE — Progress Notes (Signed)
Synopsis: Referred in 10/2022 for ILD seen on CT scanning of the chest performed when hospitalized for atrial fibrillation  Subjective:   PATIENT ID: Stacy Chapman GENDER: female DOB: 1956/04/18, MRN: [id number]   HPI  Chief Complaint  Patient presents with   Consult    Referred for abnormal CT back in December 2023     [name] was hospitalized for flu and atrial fibrillation and was found to have an abnormal CT chest.  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    No childhood asthma Never had pneumonia  She thinks her brother may have had lung cancer, otherwise no lung disease in family.    Smoked some cigarettes briefly in her 20's, never rally smoked.  Never purchased cigaretees    Record review: Hospitalized late Decewmber for atrial fibrillation, noted to have ILD on CT chest.   Past Medical History:  Diagnosis Date   Anemia    Arthritis    Asthma    Diabetes ([place])    Essential hypertension    Morbid obesity ([place])    Persistent atrial fibrillation ([place])    Stroke ([place]) 2018   TIA     Family History  Problem Relation Age of Onset   Stroke Father 42       death   Hypertension Father    Dementia Sister    Diabetes Sister    Eczema Sister    Dementia Sister    Cancer Paternal Uncle        unknown   Hypertension Brother    Stroke Brother 54       death   Cancer Brother        lung   Colon cancer Neg Hx    Colon polyps Neg Hx    Stomach cancer Neg Hx    Rectal cancer Neg Hx    Esophageal cancer Neg Hx    Breast cancer Neg Hx      Social History   Socioeconomic History   Marital status: Divorced     Spouse name: Not on file   Number of children: Not on file   Years of education: Not on file   Highest education level: Not on file  Occupational History   Not on file  Tobacco Use   Smoking status: Never   Smokeless tobacco: Never   Tobacco comments:    Never smoke 05/01/22  Vaping Use   Vaping Use: Never used  Substance and Sexual Activity   Alcohol use: Not Currently    Alcohol/week: 6.0 standard drinks of alcohol    Types: 1 Glasses of wine, 5 Standard drinks or equivalent per week    Comment: Drinks multiple times a week   Drug use: No   Sexual activity: Yes    Birth control/protection: Post-menopausal, Surgical    Comment: s/p tubal ligation  Other Topics Concern   Not on file  Social History Narrative   Lives in [place] and works in a Proofreader   Social Determinants of Health   Financial Resource Strain: Not on file  Food Insecurity: Unknown (10/09/2022)   Hunger  Vital Sign    Worried About Charity fundraiser in the Last Year: Patient refused    [place] in the Last Year: Patient refused  Transportation Needs: No Transportation Needs (10/09/2022)   PRAPARE - Hydrologist (Medical): No    Lack of Transportation (Non-Medical): No  Physical Activity: Not on file  Stress: Not on file  Social Connections: Not on file  Intimate Partner Violence: Not At Risk (10/09/2022)   Humiliation, Afraid, Rape, and Kick questionnaire    Fear of Current or Ex-Partner: No    Emotionally Abused: No    Physically Abused: No    Sexually Abused: No     Allergies  Allergen Reactions   Lisinopril Cough     Outpatient Medications Prior to Visit  Medication Sig Dispense Refill   acetaminophen (TYLENOL) 500 MG tablet Take 1,000 mg by mouth as needed for moderate pain.     albuterol (VENTOLIN HFA) 108 (90 Base) MCG/ACT inhaler Need office visit for addiitonal refills 6.7 g 0   azelastine (ASTELIN) 0.1 % nasal spray Place 1 spray into both  nostrils 2 (two) times daily. Use in each nostril as directed 90 mL 0   Biotin 2.5 MG TABS Take 1 tablet by mouth daily.     diltiazem (CARDIZEM CD) 120 MG 24 hr capsule TAKE 1 CAPSULE BY MOUTH DAILY 90 capsule 2   ferrous gluconate (FERGON) 324 MG tablet Take 324 mg by mouth daily with breakfast.     flecainide (TAMBOCOR) 50 MG tablet Take 1.5 tablets (75 mg total) by mouth 2 (two) times daily. 270 tablet 2   fluticasone (FLONASE) 50 MCG/ACT nasal spray SHAKE LIQUID AND USE 2 SPRAYS IN EACH NOSTRIL DAILY 16 g 1   furosemide (LASIX) 20 MG tablet TAKE 1 TABLET BY MOUTH DAILY AS NEEDED FOR SWELLING OR WEIGHT GAIN 30 tablet 5   montelukast (SINGULAIR) 10 MG tablet Take 1 tablet (10 mg total) by mouth at bedtime. 90 tablet 0   Multiple Vitamin (MULTIVITAMIN) capsule Take 1 capsule by mouth daily.     rosuvastatin (CRESTOR) 10 MG tablet Take 1 tablet (10 mg total) by mouth daily. 90 tablet 3   warfarin (COUMADIN) 5 MG tablet TAKE 1 TABLET TO 2 TABLETS BY MOUTH DAILY AS DIRECTED BY COUMADIN CLINIC (Patient taking differently: Take 5-10 mg by mouth See admin instructions. Taking 2 tabs ( 10 mg) on Sunday & Thursday. All other days taking 5 mg daily) 120 tablet 0   No facility-administered medications prior to visit.    Review of Systems  Constitutional:  Negative for chills, fever, malaise/fatigue and weight loss.  HENT:  Negative for congestion, nosebleeds, sinus pain and sore throat.   Eyes:  Negative for photophobia, pain and discharge.  Respiratory:  Positive for shortness of breath. Negative for cough, hemoptysis, sputum production and wheezing.   Cardiovascular:  Negative for chest pain, palpitations, orthopnea and leg swelling.  Gastrointestinal:  Negative for abdominal pain, constipation, diarrhea, nausea and vomiting.  Genitourinary:  Negative for dysuria, frequency, hematuria and urgency.  Musculoskeletal:  Negative for back pain, joint pain, myalgias and neck pain.  Skin:  Negative for  itching and rash.  Neurological:  Negative for tingling, tremors, sensory change, speech change, focal weakness, seizures, weakness and headaches.  Psychiatric/Behavioral:  Negative for memory loss, substance abuse and suicidal ideas. The patient is not nervous/anxious.       Objective:  Physical Exam   Vitals:  11/14/22 1040  BP: 130/72  Pulse: 75  SpO2: 100%  Weight: 230 lb (104.3 kg)  Height: '5\' 7"'$  (1.702 m)    Gen: well appearing, no acute distress HENT: NCAT, OP clear, neck supple without masses Eyes: PERRL, EOMi Lymph: no cervical lymphadenopathy PULM: Crackles bases B CV: RRR, no mgr, no JVD GI: BS+, soft, nontender, no hsm Derm: no rash or skin breakdown MSK: normal bulk and tone Neuro: A&Ox4, CN II-XII intact, MAEW Psyche: normal mood and affect   CBC    Component Value Date/Time   WBC 8.7 10/16/2022 0958   RBC 4.20 10/16/2022 0958   HGB 11.7 (L) 10/16/2022 0958   HCT 36.6 10/16/2022 0958   PLT 277.0 10/16/2022 0958   MCV 87.0 10/16/2022 0958   MCH 28.2 10/11/2022 0135   MCHC 31.9 10/16/2022 0958   RDW 14.3 10/16/2022 0958   LYMPHSABS 1.8 10/16/2022 0958   MONOABS 0.9 10/16/2022 0958   EOSABS 0.0 10/16/2022 0958   BASOSABS 0.0 10/16/2022 0958     Chest imaging: 2003 CT chest > IMPRESSION  EXTENSIVE MEDIASTINAL AND HILAR ADENOPATHY WITH SCATTERED PULMONARY NODULES, PREDOMINANTLY ALONG  THE PLEURAL SURFACES AND WITH NODULARITY ALSO IN THE PERIBRONCHOVASCULAR REGIONS.  THIS APPEARANCE  I S CONSISTENT WITH SARCOIDOSIS. I FEEL THAT LYMPHOMA OR TUBERCULOSIS ARE CONSIDERABLY LESS LIKELY.   METASTATIC NEOPLASM IS ALSO CONSIDERED UNLIKELY.  I RECOMMEND CORRELATION WITH AN ACE LEVEL, AND  CONSIDER BIOPSY / PULMONOLOGY CONSULTATION.   12/20223 CT chest personally reviewed> calcified bulky mediastinal adenopathy, non-specific interstitial changes in bases, no honeycombing, bronchiolectasis and faint tree in bud abnormalities in upper lobes, 1cm RUL nodule,  peritracheal mass  PFT:  Labs:  Path:  Echo:  Heart Catheterization:       Assessment & Plan:   Sarcoidosis - Plan: Pulmonary function test  Pulmonary nodules - Plan: CT Chest Wo Contrast  Mediastinal adenopathy  Peritracheal mass  ILD (interstitial lung disease) ([place])  Discussion: Kaveri has calcified mediastinal adenopathy, pulmonary nodule in the right upper lobe, and a peritracheal mass which were all seen on a recent CT scan of her chest in addition to some interstitial changes in the bases.  The most likely explanation is previously active sarcoidosis, currently inactive.  In 2003 she had a CT scan performed which showed marked adenopathy throughout her chest as well as a paratracheal mass.  She was noted to have multiple pulmonary nodules.  She does not recall ever having a biopsy or the reason why she had the CT scan performed.  Now in the presence of calcified adenopathy the most likely scenario is old sarcoid.  Differential diagnosis could include a prior granulomatous disease, prior atypical infection, or less likely something like hypersensitivity pneumonitis.  Given the fact that she does not have daily symptoms of shortness of breath I do not think there is a role for a biopsy right now.  I suspect her need for albuterol is due to effects on her lung from previously active sarcoid.  We should follow her to ensure this does not progress.  Plan: Pulmonary sarcoidosis: As discussed today the only way to make this diagnosis would be to perform a biopsy, however I do not think that is necessary right now We will follow with a repeat CT scan of your chest in 3 months Will get lung function testing in 3 months Keep using albuterol as needed for chest tightness wheezing or shortness of breath  Pulmonary nodule: Repeat CT chest 3 months  Paratracheal mass:  This was seen 20 years ago and has not really changed Will see how it appears on the repeat CT chest in 3  months but at this time I do not recommend further workup  Will see you back in 3 months or sooner if needed.  Immunizations: Immunization History  Administered Date(s) Administered   Fluad Quad(high Dose 65+) 07/12/2021, 07/11/2022   Influenza, High Dose Seasonal PF 07/15/2018   Influenza-Unspecified 07/14/2019   Pneumococcal Conjugate-13 01/10/2022   Pneumococcal Polysaccharide-23 07/15/2018   Tdap 07/15/2018   Zoster Recombinat (Shingrix) 07/12/2021, 01/10/2022     Current Outpatient Medications:    acetaminophen (TYLENOL) 500 MG tablet, Take 1,000 mg by mouth as needed for moderate pain., Disp: , Rfl:    albuterol (VENTOLIN HFA) 108 (90 Base) MCG/ACT inhaler, Need office visit for addiitonal refills, Disp: 6.7 g, Rfl: 0   azelastine (ASTELIN) 0.1 % nasal spray, Place 1 spray into both nostrils 2 (two) times daily. Use in each nostril as directed, Disp: 90 mL, Rfl: 0   Biotin 2.5 MG TABS, Take 1 tablet by mouth daily., Disp: , Rfl:    diltiazem (CARDIZEM CD) 120 MG 24 hr capsule, TAKE 1 CAPSULE BY MOUTH DAILY, Disp: 90 capsule, Rfl: 2   ferrous gluconate (FERGON) 324 MG tablet, Take 324 mg by mouth daily with breakfast., Disp: , Rfl:    flecainide (TAMBOCOR) 50 MG tablet, Take 1.5 tablets (75 mg total) by mouth 2 (two) times daily., Disp: 270 tablet, Rfl: 2   fluticasone (FLONASE) 50 MCG/ACT nasal spray, SHAKE LIQUID AND USE 2 SPRAYS IN EACH NOSTRIL DAILY, Disp: 16 g, Rfl: 1   furosemide (LASIX) 20 MG tablet, TAKE 1 TABLET BY MOUTH DAILY AS NEEDED FOR SWELLING OR WEIGHT GAIN, Disp: 30 tablet, Rfl: 5   montelukast (SINGULAIR) 10 MG tablet, Take 1 tablet (10 mg total) by mouth at bedtime., Disp: 90 tablet, Rfl: 0   Multiple Vitamin (MULTIVITAMIN) capsule, Take 1 capsule by mouth daily., Disp: , Rfl:    rosuvastatin (CRESTOR) 10 MG tablet, Take 1 tablet (10 mg total) by mouth daily., Disp: 90 tablet, Rfl: 3   warfarin (COUMADIN) 5 MG tablet, TAKE 1 TABLET TO 2 TABLETS BY MOUTH DAILY AS  DIRECTED BY COUMADIN CLINIC (Patient taking differently: Take 5-10 mg by mouth See admin instructions. Taking 2 tabs ( 10 mg) on Sunday & Thursday. All other days taking 5 mg daily), Disp: 120 tablet, Rfl: 0

## 2022-11-17 ENCOUNTER — Telehealth (HOSPITAL_COMMUNITY): Payer: Self-pay | Admitting: *Deleted

## 2022-11-17 ENCOUNTER — Other Ambulatory Visit: Payer: Self-pay

## 2022-11-17 DIAGNOSIS — I480 Paroxysmal atrial fibrillation: Secondary | ICD-10-CM

## 2022-11-17 MED ORDER — WARFARIN SODIUM 5 MG PO TABS: ORAL_TABLET | ORAL | 0 refills | Status: DC

## 2022-11-17 NOTE — Telephone Encounter (Signed)
Pt in need of warfarin refills to [facility] will forward to coumadin clinic

## 2022-11-18 ENCOUNTER — Telehealth: Payer: Self-pay | Admitting: *Deleted

## 2022-11-18 ENCOUNTER — Telehealth (INDEPENDENT_AMBULATORY_CARE_PROVIDER_SITE_OTHER): Payer: BC Managed Care – PPO | Admitting: Nurse Practitioner

## 2022-11-18 ENCOUNTER — Encounter: Payer: Self-pay | Admitting: Nurse Practitioner

## 2022-11-18 ENCOUNTER — Ambulatory Visit: Payer: BC Managed Care – PPO | Admitting: Cardiovascular Disease

## 2022-11-18 DIAGNOSIS — J4521 Mild intermittent asthma with (acute) exacerbation: Secondary | ICD-10-CM | POA: Diagnosis not present

## 2022-11-18 MED ORDER — ALBUTEROL SULFATE HFA 108 (90 BASE) MCG/ACT IN AERS: 1.0000 | INHALATION_SPRAY | Freq: Four times a day (QID) | RESPIRATORY_TRACT | 1 refills | Status: DC | PRN

## 2022-11-18 MED ORDER — DOXYCYCLINE HYCLATE 100 MG PO TABS: 100.0000 mg | ORAL_TABLET | Freq: Two times a day (BID) | ORAL | 0 refills | Status: AC

## 2022-11-18 MED ORDER — BENZONATATE 200 MG PO CAPS: 200.0000 mg | ORAL_CAPSULE | Freq: Three times a day (TID) | ORAL | 0 refills | Status: DC | PRN

## 2022-11-18 NOTE — Progress Notes (Signed)
Virtual Visit via Video Note  I connected withNAME@ on 11/18/22 at 10:40 AM EST by a video enabled telemedicine application and verified that I am speaking with the correct person using two identifiers.  Location: Patient:Home Provider: Office Participants: patient and provider  I discussed the limitations of evaluation and management by telemedicine and the availability of in person appointments. I also discussed with the patient that there may be a patient responsible charge related to this service. The patient expressed understanding and agreed to proceed.  SK:AJGOT and sinus congestion x 4days  History of Present Illness:  Cough This is a new problem. The current episode started in the past 7 days. The problem has been unchanged. The problem occurs constantly. The cough is Productive of sputum. Associated symptoms include nasal congestion, postnasal drip, rhinorrhea, shortness of breath and wheezing. Pertinent negatives include no chest pain, chills, ear congestion, ear pain, fever, headaches, heartburn, hemoptysis, myalgias, rash, sore throat, sweats or weight loss. The symptoms are aggravated by lying down. She has tried a beta-agonist inhaler for the symptoms. The treatment provided moderate relief. Her past medical history is significant for asthma.  Complete course of oral prednisone 09/2022 during hospitalization, no recent oral abx  Observations/Objective:  Assessment and Plan: Jeanni was seen today for video visit .  Diagnoses and all orders for this visit:  Mild intermittent asthma with acute exacerbation -     albuterol (VENTOLIN HFA) 108 (90 Base) MCG/ACT inhaler; Inhale 1-2 puffs into the lungs every 6 (six) hours as needed for wheezing or shortness of breath. -     benzonatate (TESSALON) 200 MG capsule; Take 1 capsule (200 mg total) by mouth 3 (three) times daily as needed. -     doxycycline (VIBRA-TABS) 100 MG tablet; Take 1 tablet (100 mg total) by mouth 2 (two) times  daily for 7 days.   Follow Up Instructions: Sent message to coumadin clinic about use of oral abx Start probiotics: curturelle or florastor 1capsule daily while taking oral antibiotics to prevent diarrhea. Ok to use robitussin or delsym or mucinex DM OTC for cough. Maintain adequate oral hydration Call office if no improvement in 1week: consider CXR if no improvement   I discussed the assessment and treatment plan with the patient. The patient was provided an opportunity to ask questions and all were answered. The patient agreed with the plan and demonstrated an understanding of the instructions.   The patient was advised to call back or seek an in-person evaluation if the symptoms worsen or if the condition fails to improve as anticipated.  Wilfred Lacy, NP

## 2022-11-18 NOTE — Telephone Encounter (Addendum)
Received a message that pt is starting doxycycline. Per medication list doxy '100mg'$  twice a day x 7 days has been ordered today by PCP. Since doxycycline can interact with warfarin will ensure pt keeps upcoming  Anticoagulation Clinic Appt on Thursday 11/20/22. Called pt but unable to hear her and the phone hung up. Will try back later.   At 408pm, spoke with pt and made her aware that doxy can interact with warfarin and to have an extra serving of leafy veggies and keep appointment on 11/20/22. She verbalized understanding.

## 2022-11-18 NOTE — Patient Instructions (Signed)
Start probiotics: curturelle or florastor 1capsule daily while taking oral antibiotics to prevent diarrhea.  Ok to use robitussin or delsym or mucinex DM OTC for cough. Maintain adequate oral hydration Call office if no improvement in 1week

## 2022-11-18 NOTE — Telephone Encounter (Signed)
-----   Message from Juluis Mire, RN sent at 11/18/2022  1:26 PM EST -----  ----- Message ----- From: [name], NP Sent: 11/18/2022  12:20 PM EST To: Juluis Mire, RN  I started Ms. Reynold's on doxycycline. Please reach out to her if she needs to return to you sooner for INR check.  Thank you Wilfred Lacy, NP

## 2022-11-18 NOTE — Assessment & Plan Note (Signed)
GI pathogen: negative Diarrhea resolved

## 2022-11-20 ENCOUNTER — Ambulatory Visit: Payer: BC Managed Care – PPO | Attending: Internal Medicine | Admitting: *Deleted

## 2022-11-20 DIAGNOSIS — Z5181 Encounter for therapeutic drug level monitoring: Secondary | ICD-10-CM

## 2022-11-20 DIAGNOSIS — I480 Paroxysmal atrial fibrillation: Secondary | ICD-10-CM | POA: Diagnosis not present

## 2022-11-20 DIAGNOSIS — I4891 Unspecified atrial fibrillation: Secondary | ICD-10-CM | POA: Diagnosis not present

## 2022-11-20 LAB — POCT INR: INR: 3 (ref 2.0–3.0)

## 2022-11-20 NOTE — Patient Instructions (Addendum)
Description   Today take 1 tablet on warfarin and add another leafy veggie to your diet since on doxycycline then continue taking 1 tablet daily except 2 tablets only on Sundays and Thursdays. Recheck INR in 3 weeks.   Resume normal green intake then be consistent with your leafy veggies and fruit smoothies (2-3 servings per week).  Coumadin Clinic [phone]

## 2022-12-04 ENCOUNTER — Ambulatory Visit (HOSPITAL_COMMUNITY): Payer: BC Managed Care – PPO

## 2022-12-11 ENCOUNTER — Ambulatory Visit: Payer: BC Managed Care – PPO | Attending: Internal Medicine

## 2022-12-11 DIAGNOSIS — Z5181 Encounter for therapeutic drug level monitoring: Secondary | ICD-10-CM | POA: Diagnosis not present

## 2022-12-11 DIAGNOSIS — I480 Paroxysmal atrial fibrillation: Secondary | ICD-10-CM

## 2022-12-11 LAB — POCT INR: INR: 2.2 (ref 2.0–3.0)

## 2022-12-11 NOTE — Patient Instructions (Signed)
Description   Continue taking 1 tablet daily except 2 tablets only on Sundays and Thursdays. Recheck INR in 4 weeks.   Resume normal green intake then be consistent with your leafy veggies and fruit smoothies (2-3 servings per week).  C[facility]madin Clinic [phone]

## 2022-12-12 ENCOUNTER — Ambulatory Visit (HOSPITAL_COMMUNITY)
Admission: RE | Admit: 2022-12-12 | Discharge: 2022-12-12 | Disposition: A | Discharge status: Home / Self Care | Payer: BC Managed Care – PPO | Source: Ambulatory Visit | Attending: Physician Assistant | Admitting: Physician Assistant

## 2022-12-12 VITALS — BP 166/74 | HR 68 | Ht 67.0 in | Wt 235.2 lb

## 2022-12-12 DIAGNOSIS — J45909 Unspecified asthma, uncomplicated: Secondary | ICD-10-CM | POA: Insufficient documentation

## 2022-12-12 DIAGNOSIS — E669 Obesity, unspecified: Secondary | ICD-10-CM | POA: Diagnosis not present

## 2022-12-12 DIAGNOSIS — D6869 Other thrombophilia: Secondary | ICD-10-CM | POA: Insufficient documentation

## 2022-12-12 DIAGNOSIS — I110 Hypertensive heart disease with heart failure: Secondary | ICD-10-CM | POA: Insufficient documentation

## 2022-12-12 DIAGNOSIS — Z79899 Other long term (current) drug therapy: Secondary | ICD-10-CM | POA: Insufficient documentation

## 2022-12-12 DIAGNOSIS — Z6836 Body mass index (BMI) 36.0-36.9, adult: Secondary | ICD-10-CM | POA: Diagnosis not present

## 2022-12-12 DIAGNOSIS — I5020 Unspecified systolic (congestive) heart failure: Secondary | ICD-10-CM | POA: Insufficient documentation

## 2022-12-12 DIAGNOSIS — I495 Sick sinus syndrome: Secondary | ICD-10-CM | POA: Diagnosis not present

## 2022-12-12 DIAGNOSIS — E119 Type 2 diabetes mellitus without complications: Secondary | ICD-10-CM | POA: Diagnosis not present

## 2022-12-12 DIAGNOSIS — Z8673 Personal history of transient ischemic attack (TIA), and cerebral infarction without residual deficits: Secondary | ICD-10-CM | POA: Insufficient documentation

## 2022-12-12 DIAGNOSIS — I4819 Other persistent atrial fibrillation: Secondary | ICD-10-CM | POA: Insufficient documentation

## 2022-12-12 DIAGNOSIS — Z7901 Long term (current) use of anticoagulants: Secondary | ICD-10-CM | POA: Insufficient documentation

## 2022-12-12 NOTE — Progress Notes (Signed)
Primary Care Physician: [name], NP Primary Cardiologist: none Primary Electrophysiologist: Dr Rayann Heman Referring Physician: [facility]   Stacy Chapman is a 67 y.o. female with a history of HTN, DM, CVA, asthma, and paroxysmal atrial fibrillation who presents for follow up in the [facility].  The patient was initially diagnosed with atrial fibrillation remotely and had previously been on Xarelto per ER report. More recently, patient was seen at the ER 08/21/19 with symptoms of SOB and generalized weakness and was found by EMS to be in afib. She did not undergo DCCV as onset of afib was unclear and she was not on anticoagulation. She was discharged on Xarelto for a CHADS2VASC score of 5. Patient reports that she had two close family members pass away the day before her symptom onset. She does admit to alcohol use. She denies significant snoring. Patient wore a Zio patch which showed 100% afib burden which was rate controlled overall. She has been maintained on flecainide.   Patient was admitted 09/2022 after a syncopal event. She was found to be going in and out of afib, having post termination pauses up to 4 seconds. This was in the setting of acute influenza infection. Patient also admitted that she had been out of diltiazem for several days and had only been taking the flecainide once daily. Her medication was resumed and prescribed doses at discharge. Echo showed mildly reduced EF 40-45%. She was also given a two week Zio monitor to evaluate her arrhythmia burden and for pauses. Syncope was felt to be due to dehydration/acute illness.   On follow up today, Zio monitor showed 19% afib burden with frequent pauses, longest episode was 8.5 seconds. Pauses were predominately nocturnal. Patient reports that she is feeling much better now with minimal palpitations. No further syncope or dizziness.   Today, she denies symptoms of chest pain, orthopnea, PND,  lower extremity edema, dizziness, presyncope, syncope, snoring, daytime somnolence, bleeding, or neurologic sequela. The patient is tolerating medications without difficulties and is otherwise without complaint today.    Atrial Fibrillation Risk Factors:  she does not have symptoms or diagnosis of sleep apnea. she does not have a history of rheumatic fever. she does have a history of alcohol use. The patient does not have a history of early familial atrial fibrillation or other arrhythmias.  she has a BMI of Body mass index is 36.84 kg/m.Marland Kitchen Filed Weights   12/12/22 1103  Weight: 106.7 kg    Family History  Problem Relation Age of Onset   Stroke Father 98       death   Hypertension Father    Dementia Sister    Diabetes Sister    Eczema Sister    Dementia Sister    Cancer Paternal Uncle        unknown   Hypertension Brother    Stroke Brother 77       death   Cancer Brother        lung   Colon cancer Neg Hx    Colon polyps Neg Hx    Stomach cancer Neg Hx    Rectal cancer Neg Hx    Esophageal cancer Neg Hx    Breast cancer Neg Hx      Atrial Fibrillation Management history:  Previous antiarrhythmic drugs: flecainide Previous cardioversions: none Previous ablations: none CHADS2VASC score: 5 Anticoagulation history: Xarelto, warfarin    Past Medical History:  Diagnosis Date   Anemia  Arthritis    Asthma    Diabetes ([place])    Diarrhea of presumed infectious origin 10/09/2022   Essential hypertension    Morbid obesity (HCC)    Persistent atrial fibrillation ([place])    Stroke ([place]) 2018   TIA   Past Surgical History:  Procedure Laterality Date   KNEE ARTHROSCOPY     TONSILLECTOMY     TUBAL LIGATION  1991    Current Outpatient Medications  Medication Sig Dispense Refill   acetaminophen (TYLENOL) 500 MG tablet Take 1,000 mg by mouth as needed for moderate pain.     albuterol (VENTOLIN HFA) 108 (90 Base) MCG/ACT inhaler Inhale 1-2 puffs into the lungs every  6 (six) hours as needed for wheezing or shortness of breath. 6.7 g 1   azelastine (ASTELIN) 0.1 % nasal spray Place 1 spray into both nostrils 2 (two) times daily. Use in each nostril as directed 90 mL 0   Biotin 2.5 MG TABS Take 1 tablet by mouth daily.     diltiazem (CARDIZEM CD) 120 MG 24 hr capsule TAKE 1 CAPSULE BY MOUTH DAILY 90 capsule 2   ferrous gluconate (FERGON) 324 MG tablet Take 324 mg by mouth daily with breakfast.     flecainide (TAMBOCOR) 50 MG tablet Take 1.5 tablets (75 mg total) by mouth 2 (two) times daily. 270 tablet 2   fluticasone (FLONASE) 50 MCG/ACT nasal spray SHAKE LIQUID AND USE 2 SPRAYS IN EACH NOSTRIL DAILY 16 g 1   furosemide (LASIX) 20 MG tablet TAKE 1 TABLET BY MOUTH DAILY AS NEEDED FOR SWELLING OR WEIGHT GAIN 30 tablet 5   montelukast (SINGULAIR) 10 MG tablet Take 1 tablet (10 mg total) by mouth at bedtime. 90 tablet 0   Multiple Vitamin (MULTIVITAMIN) capsule Take 1 capsule by mouth daily.     rosuvastatin (CRESTOR) 10 MG tablet Take 1 tablet (10 mg total) by mouth daily. 90 tablet 3   warfarin (COUMADIN) 5 MG tablet TAKE 1 TABLET TO 2 TABLETS BY MOUTH DAILY AS DIRECTED BY COUMADIN CLINIC 120 tablet 0   benzonatate (TESSALON) 200 MG capsule Take 1 capsule (200 mg total) by mouth 3 (three) times daily as needed. (Patient not taking: Reported on 12/12/2022) 30 capsule 0   No current facility-administered medications for this encounter.    Allergies  Allergen Reactions   Lisinopril Cough    Social History   Socioeconomic History   Marital status: Divorced    Spouse name: Not on file   Number of children: Not on file   Years of education: Not on file   Highest education level: Not on file  Occupational History   Not on file  Tobacco Use   Smoking status: Never   Smokeless tobacco: Never   Tobacco comments:    Never smoke 05/01/22  Vaping Use   Vaping Use: Never used  Substance and Sexual Activity   Alcohol use: Not Currently    Alcohol/week: 6.0  standard drinks of alcohol    Types: 1 Glasses of wine, 5 Standard drinks or equivalent per week    Comment: Drinks multiple times a week   Drug use: No   Sexual activity: Yes    Birth control/protection: Post-menopausal, Surgical    Comment: s/p tubal ligation  Other Topics Concern   Not on file  Social History Narrative   Lives in [place] and works in a Proof[employer]   Social Determinants of Radio broadcast assistant Strain: Not on file  Food Insecurity: Unknown (  10/09/2022)   Hunger Vital Sign    Worried About Running Out of Food in the Last Year: Patient refused    Vivian in the Last Year: Patient refused  Transportation Needs: No Transportation Needs (10/09/2022)   PRAPARE - Hydrologist (Medical): No    Lack of Transportation (Non-Medical): No  Physical Activity: Not on file  Stress: Not on file  Social Connections: Not on file  Intimate Partner Violence: Not At Risk (10/09/2022)   Humiliation, Afraid, Rape, and Kick questionnaire    Fear of Current or Ex-Partner: No    Emotionally Abused: No    Physically Abused: No    Sexually Abused: No     ROS- All systems are reviewed and negative except as per the HPI above.  Physical Exam: Vitals:   12/12/22 1103  BP: (!) 166/74  Pulse: 68  Weight: 106.7 kg  Height: '5\' 7"'$  (1.702 m)    GEN- The patient is a well appearing obese female, alert and oriented x 3 today.   HEENT-head normocephalic, atraumatic, sclera clear, conjunctiva pink, hearing intact, trachea midline. Lungs- Clear to ausculation bilaterally, normal work of breathing Heart- Regular rate and rhythm, no murmurs, rubs or gallops  GI- soft, NT, ND, + BS Extremities- no clubbing, cyanosis, or edema MS- no significant deformity or atrophy Skin- no rash or lesion Psych- euthymic mood, full affect Neuro- strength and sensation are intact   Wt Readings from Last 3 Encounters:  12/12/22 106.7 kg  11/14/22 104.3 kg   11/03/22 104.7 kg    EKG today demonstrates  SR Vent. rate 68 BPM PR interval 174 ms QRS duration 92 ms QT/QTcB 402/427 ms  Echo 10/09/22  1. Left ventricular ejection fraction, by estimation, is 40 to 45%. The  left ventricle has mildly decreased function. The left ventricle  demonstrates global hypokinesis. There is mild concentric left ventricular  hypertrophy. Left ventricular diastolic  parameters are indeterminate.   2. Right ventricular systolic function is normal. The right ventricular  size is normal. There is normal pulmonary artery systolic pressure.   3. The mitral valve is normal in structure. Mild mitral valve  regurgitation. No evidence of mitral stenosis.   4. The aortic valve is normal in structure. Aortic valve regurgitation is  trivial. No aortic stenosis is present.   5. The inferior vena cava is normal in size with greater than 50%  respiratory variability, suggesting right atrial pressure of 3 mmHg.    Epic records are reviewed at length today  CHA2DS2-VASc Score = 7  The patient's score is based upon: CHF History: 1 HTN History: 1 Diabetes History: 1 Stroke History: 2 Vascular Disease History: 0 Age Score: 1 Gender Score: 1       ASSESSMENT AND PLAN: 1. Persistent Atrial Fibrillation (ICD10:  I48.19) The patient's CHA2DS2-VASc score is 7, indicating a 11.2% annual risk of stroke.   Patient appears to be maintaining SR with minimal palpitations.  Continue flecainide 75 mg BID Continue diltiazem 120 mg daily Mildly reduced EF 09/2022 felt to be related to afib, will recheck echo. If her EF remains decreased, will need to stop flecainide and diltiazem and consider alternate rhythm control. She would favor ablation over medication, referred to EP today.  Continue warfarin   2. Secondary Hypercoagulable State (ICD10:  D68.69) The patient is at significant risk for stroke/thromboembolism based upon her CHA2DS2-VASc Score of 7.  Continue Warfarin  (Coumadin).   3. Obesity Body mass  index is 36.84 kg/m. Lifestyle modification was discussed and encouraged including regular physical activity and weight reduction.  4. HTN Elevated today, well controlled at home. No changes today.  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 Reschedule echocardiogram  6. Tachybradycardia syndrome Zio monitor showed very frequent pauses, up to 8.5 seconds.  Many are nocturnal and asymptomatic. However, she did have a syncopal event 09/2022, thought to be dehydration at the time but question if it could have been arrhythmogenic based on monitor results. Will reschedule consultation with EP. Hopefully, with treatment of afib she will not have as frequent post termination pauses.    Follow up with EP to establish care (former [facility] patient). AF clinic in 6 months.    [facility] [address] [place], [place] [address] [phone] 12/12/2022 11:56 AM

## 2022-12-15 ENCOUNTER — Emergency Department (HOSPITAL_COMMUNITY): Payer: BC Managed Care – PPO

## 2022-12-15 ENCOUNTER — Other Ambulatory Visit: Payer: Self-pay

## 2022-12-15 ENCOUNTER — Encounter (HOSPITAL_COMMUNITY): Payer: Self-pay

## 2022-12-15 ENCOUNTER — Emergency Department (HOSPITAL_COMMUNITY)
Admission: EM | Admit: 2022-12-15 | Discharge: 2022-12-15 | Disposition: A | Discharge status: Home / Self Care | Payer: BC Managed Care – PPO | Attending: Emergency Medicine | Admitting: Emergency Medicine

## 2022-12-15 DIAGNOSIS — J45909 Unspecified asthma, uncomplicated: Secondary | ICD-10-CM | POA: Insufficient documentation

## 2022-12-15 DIAGNOSIS — Z7901 Long term (current) use of anticoagulants: Secondary | ICD-10-CM | POA: Diagnosis not present

## 2022-12-15 DIAGNOSIS — Z7951 Long term (current) use of inhaled steroids: Secondary | ICD-10-CM | POA: Diagnosis not present

## 2022-12-15 DIAGNOSIS — R5383 Other fatigue: Secondary | ICD-10-CM | POA: Diagnosis not present

## 2022-12-15 DIAGNOSIS — R531 Weakness: Secondary | ICD-10-CM | POA: Diagnosis not present

## 2022-12-15 DIAGNOSIS — I10 Essential (primary) hypertension: Secondary | ICD-10-CM | POA: Insufficient documentation

## 2022-12-15 DIAGNOSIS — R55 Syncope and collapse: Secondary | ICD-10-CM | POA: Insufficient documentation

## 2022-12-15 DIAGNOSIS — R0989 Other specified symptoms and signs involving the circulatory and respiratory systems: Secondary | ICD-10-CM | POA: Diagnosis not present

## 2022-12-15 DIAGNOSIS — E119 Type 2 diabetes mellitus without complications: Secondary | ICD-10-CM | POA: Diagnosis not present

## 2022-12-15 DIAGNOSIS — Z79899 Other long term (current) drug therapy: Secondary | ICD-10-CM | POA: Diagnosis not present

## 2022-12-15 DIAGNOSIS — R000 Tachycardia, unspecified: Secondary | ICD-10-CM | POA: Diagnosis not present

## 2022-12-15 LAB — CBC WITH DIFFERENTIAL/PLATELET
Abs Immature Granulocytes: 0.02 10*3/uL (ref 0.00–0.07)
Basophils Absolute: 0 10*3/uL (ref 0.0–0.1)
Basophils Relative: 1 %
Eosinophils Absolute: 0.1 10*3/uL (ref 0.0–0.5)
Eosinophils Relative: 1 %
HCT: 39.4 % (ref 36.0–46.0)
Hemoglobin: 12.4 g/dL (ref 12.0–15.0)
Immature Granulocytes: 0 %
Lymphocytes Relative: 19 %
Lymphs Abs: 1.2 10*3/uL (ref 0.7–4.0)
MCH: 27.7 pg (ref 26.0–34.0)
MCHC: 31.5 g/dL (ref 30.0–36.0)
MCV: 88.1 fL (ref 80.0–100.0)
Monocytes Absolute: 0.4 10*3/uL (ref 0.1–1.0)
Monocytes Relative: 6 %
Neutro Abs: 4.9 10*3/uL (ref 1.7–7.7)
Neutrophils Relative %: 73 %
Platelets: 253 10*3/uL (ref 150–400)
RBC: 4.47 MIL/uL (ref 3.87–5.11)
RDW: 14.2 % (ref 11.5–15.5)
WBC: 6.7 10*3/uL (ref 4.0–10.5)
nRBC: 0 % (ref 0.0–0.2)

## 2022-12-15 LAB — BASIC METABOLIC PANEL
Anion gap: 10 (ref 5–15)
BUN: 23 mg/dL (ref 8–23)
CO2: 23 mmol/L (ref 22–32)
Calcium: 9.5 mg/dL (ref 8.9–10.3)
Chloride: 104 mmol/L (ref 98–111)
Creatinine, Ser: 1.25 mg/dL — ABNORMAL HIGH (ref 0.44–1.00)
GFR, Estimated: 48 mL/min — ABNORMAL LOW (ref >60)
Glucose, Bld: 112 mg/dL — ABNORMAL HIGH (ref 70–99)
Potassium: 4 mmol/L (ref 3.5–5.1)
Sodium: 137 mmol/L (ref 135–145)

## 2022-12-15 NOTE — ED Triage Notes (Signed)
Pt brought in by EMS from work after having a near syncope event. Pt felt weak and a little dizzy; pt does endorse not having much to eat today. Pt denies any complaints at this time

## 2022-12-15 NOTE — ED Provider Notes (Signed)
[facility] Provider Note   CSN: [id number] Arrival date & time: 12/15/22  1429     History  Chief Complaint  Patient presents with   Near Syncope    Stacy Chapman is a 67 y.o. female.  Patient presents to the emergency department via EMS complaining of near syncopal event.  Patient states that she felt weak and slightly dizzy.  She endorses only eating a [employer] bar for lunch today.  She states at this time she feels back to baseline and has no complaints.  Denies shortness of breath, dizziness, lightheadedness, chest pain, urinary symptoms.  The patient does have a cardiologist and states that there is a plan for an echocardiogram for later this month.  Patient has past medical history significant for hypertension, type II DM, persistent A-fib, anemia, asthma, stroke  HPI     Home Medications Prior to Admission medications   Medication Sig Start Date End Date Taking? Authorizing Provider  acetaminophen (TYLENOL) 500 MG tablet Take 1,000 mg by mouth as needed for moderate pain.    [provider]  albuterol (VENTOLIN HFA) 108 (90 Base) MCG/ACT inhaler Inhale 1-2 puffs into the lungs every 6 (six) hours as needed for wheezing or shortness of breath. 11/18/22   Nche, Charlene Brooke, NP  azelastine (ASTELIN) 0.1 % nasal spray Place 1 spray into both nostrils 2 (two) times daily. Use in each nostril as directed 08/11/22   Nche, Charlene Brooke, NP  benzonatate (TESSALON) 200 MG capsule Take 1 capsule (200 mg total) by mouth 3 (three) times daily as needed. Patient not taking: Reported on 12/12/2022 11/18/22   Nche, Charlene Brooke, NP  Biotin 2.5 MG TABS Take 1 tablet by mouth daily.    [provider]  diltiazem (CARDIZEM CD) 120 MG 24 hr capsule TAKE 1 CAPSULE BY MOUTH DAILY 10/08/22   Fenton, Clint R, PA  ferrous gluconate (FERGON) 324 MG tablet Take 324 mg by mouth daily with breakfast.    [provider]   flecainide (TAMBOCOR) 50 MG tablet Take 1.5 tablets (75 mg total) by mouth 2 (two) times daily. 09/15/22   Fenton, Clint R, PA  fluticasone (FLONASE) 50 MCG/ACT nasal spray SHAKE LIQUID AND USE 2 SPRAYS IN EACH NOSTRIL DAILY 01/24/20   Nche, Charlene Brooke, NP  furosemide (LASIX) 20 MG tablet TAKE 1 TABLET BY MOUTH DAILY AS NEEDED FOR SWELLING OR WEIGHT GAIN 10/14/22   Fenton, Clint R, PA  montelukast (SINGULAIR) 10 MG tablet Take 1 tablet (10 mg total) by mouth at bedtime. 11/12/22   Nche, Charlene Brooke, NP  Multiple Vitamin (MULTIVITAMIN) capsule Take 1 capsule by mouth daily.    [provider]  rosuvastatin (CRESTOR) 10 MG tablet Take 1 tablet (10 mg total) by mouth daily. 01/10/22   Nche, Charlene Brooke, NP  warfarin (COUMADIN) 5 MG tablet TAKE 1 TABLET TO 2 TABLETS BY MOUTH DAILY AS DIRECTED BY COUMADIN CLINIC 11/17/22   Nahser, Wonda Cheng, MD      Allergies    Lisinopril    Review of Systems   Review of Systems  Constitutional:  Negative for fever.  Respiratory:  Negative for shortness of breath.   Cardiovascular:  Negative for chest pain.  Gastrointestinal:  Negative for abdominal pain, nausea and vomiting.  Neurological:  Positive for light-headedness (Resolved).    Physical Exam Updated Vital Signs BP (!) 115/93   Pulse 87   Temp 98.3 F (36.8 C) (Oral)   Resp  19   Ht '5\' 7"'$  (1.702 m)   Wt 104.3 kg   SpO2 100%   BMI 36.02 kg/m  Physical Exam Vitals and nursing note reviewed.  Constitutional:      General: She is not in acute distress.    Appearance: She is well-developed.  HENT:     Head: Normocephalic and atraumatic.  Eyes:     Conjunctiva/sclera: Conjunctivae normal.  Cardiovascular:     Rate and Rhythm: Normal rate and regular rhythm.     Heart sounds: No murmur heard. Pulmonary:     Effort: Pulmonary effort is normal. No respiratory distress.     Breath sounds: Rhonchi (Minimal rhonchi in right upper lobe) present.  Abdominal:     Palpations: Abdomen is  soft.     Tenderness: There is no abdominal tenderness.  Musculoskeletal:        General: No swelling.     Cervical back: Neck supple.  Skin:    General: Skin is warm and dry.     Capillary Refill: Capillary refill takes less than 2 seconds.  Neurological:     Mental Status: She is alert.  Psychiatric:        Mood and Affect: Mood normal.     ED Results / Procedures / Treatments   Labs (all labs ordered are listed, but only abnormal results are displayed) Labs Reviewed  BASIC METABOLIC PANEL - Abnormal; Notable for the following components:      Result Value   Glucose, Bld 112 (*)    Creatinine, Ser 1.25 (*)    GFR, Estimated 48 (*)    All other components within normal limits  CBC WITH DIFFERENTIAL/PLATELET    EKG EKG Interpretation  Date/Time:  Monday December 15 2022 14:41:33 EST Ventricular Rate:  60 PR Interval:    QRS Duration: 113 QT Interval:  404 QTC Calculation: 404 R Axis:   57 Text Interpretation: Atrial fibrillation Borderline intraventricular conduction delay Baseline wander in lead(s) III Confirmed by Georgina Snell [phone]) on 12/15/2022 4:09:09 PM  Radiology DG Chest 2 View  Result Date: 12/15/2022 CLINICAL DATA:  Adventitious lung sounds, right upper lobe. Near syncopal event. EXAM: CHEST - 2 VIEW COMPARISON:  Chest radiographs 10/09/2022 08/21/2019; CT chest 10/09/2022 FINDINGS: Cardiac silhouette is at the upper limits of normal size. Mediastinal contours are within limits. Mild calcification within the aortic arch. The lungs are clear. No pleural effusion pneumothorax. Mild multilevel degenerative disc changes of the thoracic spine. IMPRESSION: No active cardiopulmonary disease. Electronically Signed   By: Yvonne Kendall M.D.   On: 12/15/2022 15:56    Procedures Procedures    Medications Ordered in ED Medications - No data to display  ED Course/ Medical Decision Making/ A&P                             Medical Decision Making Amount and/or  Complexity of Data Reviewed Labs: ordered. Radiology: ordered.   This patient presents to the ED for concern of a near syncopal episode, this involves an extensive number of treatment options, and is a complaint that carries with it a high risk of complications and morbidity.  The differential diagnosis includes dysrhythmia, vasovagal episode, orthostatic changes, metabolic abnormalities, CVA, and others   Co morbidities that complicate the patient evaluation  Type II DM, hypertension   Additional history obtained:  Additional history obtained from EMS External records from outside source obtained and reviewed including notes documenting recent long-term  heart monitor with many sinus pauses   Lab Tests:  I Ordered, and personally interpreted labs.  The pertinent results include: Grossly unremarkable CBC, BMP   Imaging Studies ordered:  I ordered imaging studies including chest x-ray I independently visualized and interpreted imaging which showed no acute findings I agree with the radiologist interpretation   Cardiac Monitoring: / EKG:  The patient was maintained on a cardiac monitor.  I personally viewed and interpreted the cardiac monitored which showed an underlying rhythm of: Atrial fibrillation   Test / Admission - Considered:  No new dysrhythmia, no abnormality on chest x-ray.  Patient denies any symptoms.  She was able to get up and walk to the bathroom without feeling lightheaded.  Doubt orthostatic changes.  Based on the description of the event doubt vasovagal syncope.  The patient does have recent outpatient study which showed sinus pauses.  I feel that she may have had a pause today which may have caused her brief lightheadedness.  She currently has an outpatient study scheduled for later this month with follow-up with EP.  Plan to have patient keep that appointment.  Return precautions provided.        Final Clinical Impression(s) / ED Diagnoses Final  diagnoses:  Near syncope    Rx / DC Orders ED Discharge Orders     None         Ronny Bacon 12/15/22 1641    Elgie Congo, MD 12/15/22 [phone]

## 2022-12-15 NOTE — Discharge Instructions (Addendum)
You were evaluated today for a near syncopal episode.  Your workup was reassuring for no life-threatening causes of the episode at this time.  Please follow-up with cardiology and primary care as needed.  If you develop any life-threatening symptoms such as chest pain, shortness of breath, or further syncopal episodes, please return to the emergency department.

## 2022-12-15 NOTE — ED Notes (Signed)
Pt ambulated to and from the bathroom with no issues/complaints

## 2022-12-19 ENCOUNTER — Other Ambulatory Visit: Payer: Self-pay | Admitting: Nurse Practitioner

## 2022-12-19 DIAGNOSIS — J4521 Mild intermittent asthma with (acute) exacerbation: Secondary | ICD-10-CM

## 2022-12-19 NOTE — Telephone Encounter (Signed)
Patient notified that Rx denied and will need an office visit. Scheduled patient for an appointment on Monday at [facility].

## 2022-12-22 ENCOUNTER — Ambulatory Visit: Payer: BC Managed Care – PPO | Attending: Cardiovascular Disease | Admitting: Cardiovascular Disease

## 2022-12-22 ENCOUNTER — Ambulatory Visit (INDEPENDENT_AMBULATORY_CARE_PROVIDER_SITE_OTHER): Payer: BC Managed Care – PPO | Admitting: Nurse Practitioner

## 2022-12-22 ENCOUNTER — Encounter: Payer: Self-pay | Admitting: Cardiovascular Disease

## 2022-12-22 ENCOUNTER — Encounter: Payer: Self-pay | Admitting: Nurse Practitioner

## 2022-12-22 VITALS — BP 144/82 | HR 88 | Ht 67.0 in | Wt 234.6 lb

## 2022-12-22 VITALS — BP 124/76 | HR 68 | Temp 98.1°F | Resp 16 | Ht 67.0 in | Wt 236.2 lb

## 2022-12-22 DIAGNOSIS — R053 Chronic cough: Secondary | ICD-10-CM | POA: Insufficient documentation

## 2022-12-22 DIAGNOSIS — J4521 Mild intermittent asthma with (acute) exacerbation: Secondary | ICD-10-CM | POA: Diagnosis not present

## 2022-12-22 DIAGNOSIS — I4819 Other persistent atrial fibrillation: Secondary | ICD-10-CM

## 2022-12-22 DIAGNOSIS — D6869 Other thrombophilia: Secondary | ICD-10-CM | POA: Diagnosis not present

## 2022-12-22 MED ORDER — FAMOTIDINE 20 MG PO TABS: 20.0000 mg | ORAL_TABLET | Freq: Two times a day (BID) | ORAL | 0 refills | Status: DC

## 2022-12-22 MED ORDER — FLUTICASONE-SALMETEROL 115-21 MCG/ACT IN AERO: 2.0000 | INHALATION_SPRAY | Freq: Two times a day (BID) | RESPIRATORY_TRACT | 0 refills | Status: DC

## 2022-12-22 MED ORDER — BENZONATATE 200 MG PO CAPS: 200.0000 mg | ORAL_CAPSULE | Freq: Three times a day (TID) | ORAL | 0 refills | Status: DC | PRN

## 2022-12-22 NOTE — Assessment & Plan Note (Addendum)
Chronic cough waxing and waning, worse in AM, productive with clear mucus Some improvement with benzonatate Has intermittent heartburn and belching, some improvement with tums No fever, no SOB, no night sweats,no unintended weight loss. Hx of sinusitis (denies any sinus congestion of pressure or post nasal drainage today), current use of montelukast CXR completed 11/2022: no acute finding. Has upcoming appt for CT chest and PFT per pulmonology.  GERD vs Asthma vs sarcoidosis? Try famotidine and advair x 2-4weeks. Advised to avoid GERD trigger foods and not to eat within 2hrs of bedtime Defer to pulmonology if no improvement

## 2022-12-22 NOTE — Patient Instructions (Signed)
Avoid meals within 2hrs of bedtime Call office if no improvement in 2weeks. Rinse mouth after each use of advair.

## 2022-12-22 NOTE — Patient Instructions (Addendum)
Medication Instructions:  Your physician recommends that you continue on your current medications as directed. Please refer to the Current Medication list given to you today.  *If you need a refill on your cardiac medications before your next appointment, please call your pharmacy*  Lab Work: None ordered.  If you have labs (blood work) drawn today and your tests are completely normal, you will receive your results only by: [place] (if you have MyChart) OR A paper copy in the mail If you have any lab test that is abnormal or we need to change your treatment, we will call you to review the results.  Testing/Procedures: None ordered.  Follow-Up: Dr. Doralee Albino has scheduled an Atrial Fibrillation Ablation with Anesthesia, Carto, and Ice.  Our scheduling team will reach out to you to schedule a day and time.  You will need to have labs drawn, CBC and BMET, 30 days prior to your procedure.  We will also schedule this visit when we call you.     Provider:   Doralee Albino, MD{or one of the following Advanced Practice Providers on your designated Care Team:   Tommye Standard, [place] Legrand Como "Jonni Sanger" Chalmers Cater, [place]  Cardiac Ablation Cardiac ablation is a procedure to destroy, or ablate, a small amount of heart tissue that is causing problems. The heart has many electrical connections. Sometimes, these connections are abnormal and can cause the heart to beat very fast or irregularly. Ablating the abnormal areas can improve the heart's rhythm or return it to normal. Ablation may be done for people who: Have irregular or rapid heartbeats (arrhythmias). Have Wolff-Parkinson-White syndrome. Have taken medicines for an arrhythmia that did not work or caused side effects. Have a high-risk heartbeat that may be life-threatening. Tell a health care provider about: Any allergies you have. All medicines you are taking, including vitamins, herbs, eye drops, creams, and over-the-counter  medicines. Any problems you or family members have had with anesthesia. Any bleeding problems you have. Any surgeries you have had. Any medical conditions you have. Whether you are pregnant or may be pregnant. What are the risks? Your health care provider will talk with you about risks. These may include: Infection. Bruising and bleeding. Stroke or blood clots. Damage to nearby structures or organs. Allergic reaction to medicines or dyes. Needing a pacemaker if the heart gets damaged. A pacemaker is a device that helps the heart beat normally. Failure of the procedure. A repeat procedure may be needed. What happens before the procedure? Medicines Ask your health care provider about: Changing or stopping your regular medicines. These include any heart rhythm medicines, diabetes medicines, or blood thinners you take. Taking medicines such as aspirin and ibuprofen. These medicines can thin your blood. Do not take them unless your health care provider tells you to. Taking over-the-counter medicines, vitamins, herbs, and supplements. General instructions Follow instructions from your health care provider about what you may eat and drink. If you will be going home right after the procedure, plan to have a responsible adult: Take you home from the hospital or clinic. You will not be allowed to drive. Care for you for the time you are told. Ask your health care provider what steps will be taken to prevent infection. What happens during the procedure?  An IV will be inserted into one of your veins. You may be given: A sedative. This helps you relax. Anesthesia. This will: Numb certain areas of your body. An incision will be made in your neck or your groin.  A needle will be inserted through the incision and into a large vein in your neck or groin. The small, thin tube (catheter) will be inserted through the needle and moved to your heart. A type of X-ray (fluoroscopy) will be used to help  guide the catheter and provide images of the heart on a monitor. Dye may be injected through the catheter to help your surgeon see the area of the heart that needs treatment. Electrical currents will be sent from the catheter to destroy heart tissue in certain areas. There are three types of energy that may be used to do this: Heat (radiofrequency energy). Laser energy. Extreme cold (cryoablation). When the tissue has been destroyed, the catheter will be removed. Pressure will be held on the insertion area to prevent bleeding. A bandage (dressing) will be placed over the insertion area. The procedure may vary among health care providers and hospitals. What happens after the procedure? Your blood pressure, heart rate and rhythm, breathing rate, and blood oxygen level will be monitored until you leave the hospital or clinic. Your insertion area will be checked for bleeding. You will need to lie still for a few hours. If your groin was used, you will need to keep your leg straight for a few hours after the catheter is removed. This information is not intended to replace advice given to you by your health care provider. Make sure you discuss any questions you have with your health care provider. Document Revised: 03/18/2022 Document Reviewed: 03/18/2022 Elsevier Patient Education  [place].

## 2022-12-22 NOTE — Progress Notes (Signed)
Electrophysiology Office Note:    Date:  12/22/2022   ID:  Stacy Chapman, DOB October 29, 1955, MRN [id number]  PCP:  Stacy Buffy, NP   [facility] Cardiologist:  Stacy Moores, MD     Referring MD: Stacy Buffy, NP   History of Present Illness:    Stacy Chapman is a 67 y.o. female with a hx listed below, significant for atrial fibrillation, TIA, referred for arrhythmia management.  She reports that she was initially diagnosed with atrial fibrillation years ago.  In November 2020, she presented to the ER with palpitations and found to be in A-fib.  Xarelto was started at that time.  She was admitted for syncope in December 2023.  She was noted to be in and out of A-fib with occasional posttermination pauses of up to 4 seconds.  She was also suffering influenza.  She had been out of diltiazem and taking only 1 dose of flecainide daily.  She confesses today that she has been taking flecainide only once daily for years.  EF was 40 to 45%.  Her medications were resumed. A Zio patch was placed that showed 90% atrial fibrillation burden with frequent pauses, longest 8.5 seconds.  These pauses occurred during sleep, however.  She is in AF today but not experiencing any fatigue or palpitations today.  She has noted some "wooziness" --she describes this as dizziness when she turns her head.  He notes this has been worse since she started taking flecainide twice daily.   Past Medical History:  Diagnosis Date   Anemia    Arthritis    Asthma    Diabetes ([place])    Diarrhea of presumed infectious origin 10/09/2022   Essential hypertension    Morbid obesity ([place])    Persistent atrial fibrillation ([place])    Stroke ([place]) 2018   TIA    Past Surgical History:  Procedure Laterality Date   KNEE ARTHROSCOPY     TONSILLECTOMY     TUBAL LIGATION  1991    Current Medications: Current Meds  Medication Sig   acetaminophen (TYLENOL) 500 MG tablet Take 1,000  mg by mouth as needed for moderate pain.   albuterol (VENTOLIN HFA) 108 (90 Base) MCG/ACT inhaler Inhale 1-2 puffs into the lungs every 6 (six) hours as needed for wheezing or shortness of breath.   azelastine (ASTELIN) 0.1 % nasal spray Place 1 spray into both nostrils 2 (two) times daily. Use in each nostril as directed   benzonatate (TESSALON) 200 MG capsule Take 1 capsule (200 mg total) by mouth 3 (three) times daily as needed.   Biotin 2.5 MG TABS Take 1 tablet by mouth daily.   diltiazem (CARDIZEM CD) 120 MG 24 hr capsule TAKE 1 CAPSULE BY MOUTH DAILY   famotidine (PEPCID) 20 MG tablet Take 1 tablet (20 mg total) by mouth 2 (two) times daily.   ferrous gluconate (FERGON) 324 MG tablet Take 324 mg by mouth daily with breakfast.   flecainide (TAMBOCOR) 50 MG tablet Take 1.5 tablets (75 mg total) by mouth 2 (two) times daily.   fluticasone (FLONASE) 50 MCG/ACT nasal spray SHAKE LIQUID AND USE 2 SPRAYS IN EACH NOSTRIL DAILY   fluticasone-salmeterol (ADVAIR HFA) 115-21 MCG/ACT inhaler Inhale 2 puffs into the lungs 2 (two) times daily. Rinse mouth after each use   furosemide (LASIX) 20 MG tablet TAKE 1 TABLET BY MOUTH DAILY AS NEEDED FOR SWELLING OR WEIGHT GAIN   montelukast (SINGULAIR) 10 MG tablet Take 1 tablet (  10 mg total) by mouth at bedtime.   Multiple Vitamin (MULTIVITAMIN) capsule Take 1 capsule by mouth daily.   rosuvastatin (CRESTOR) 10 MG tablet Take 1 tablet (10 mg total) by mouth daily.   warfarin (COUMADIN) 5 MG tablet TAKE 1 TABLET TO 2 TABLETS BY MOUTH DAILY AS DIRECTED BY COUMADIN CLINIC     Allergies:   Lisinopril   Social and Family History: Reviewed in Epic  ROS:   Please see the history of present illness.    All other systems reviewed and are negative.  EKGs/Labs/Other Studies Reviewed Today:    Cardiac Studies & Procedures       ECHOCARDIOGRAM  ECHOCARDIOGRAM COMPLETE 10/09/2022  Narrative ECHOCARDIOGRAM REPORT    Patient Name:   Stacy Chapman  Date of Exam: 10/09/2022 Medical Rec #:  [id number]            Height:       67.0 in Accession #:    [id number]           Weight:       220.0 lb Date of Birth:  20-Sep-1956            BSA:          2.106 m Patient Age:    58 years             BP:           128/72 mmHg Patient Gender: F                    HR:           75 bpm. Exam Location:  Inpatient  Procedure: 2D Echo and Intracardiac Opacification Agent  Indications:    syncope  History:        Patient has prior history of Echocardiogram examinations, most recent 09/12/2019. Arrythmias:Atrial Fibrillation; Risk Factors:Hypertension, Diabetes and Dyslipidemia.  Sonographer:    Harvie Junior Referring Phys: [id number] [place]   1. Left ventricular ejection fraction, by estimation, is 40 to 45%. The left ventricle has mildly decreased function. The left ventricle demonstrates global hypokinesis. There is mild concentric left ventricular hypertrophy. Left ventricular diastolic parameters are indeterminate. 2. Right ventricular systolic function is normal. The right ventricular size is normal. There is normal pulmonary artery systolic pressure. 3. The mitral valve is normal in structure. Mild mitral valve regurgitation. No evidence of mitral stenosis. 4. The aortic valve is normal in structure. Aortic valve regurgitation is trivial. No aortic stenosis is present. 5. The inferior vena cava is normal in size with greater than 50% respiratory variability, suggesting right atrial pressure of 3 mmHg.  FINDINGS Left Ventricle: Left ventricular ejection fraction, by estimation, is 40 to 45%. The left ventricle has mildly decreased function. The left ventricle demonstrates global hypokinesis. Definity contrast agent was given IV to delineate the left ventricular endocardial borders. The left ventricular internal cavity size was normal in size. There is mild concentric left ventricular hypertrophy. Left ventricular diastolic  parameters are indeterminate.  Right Ventricle: The right ventricular size is normal. No increase in right ventricular wall thickness. Right ventricular systolic function is normal. There is normal pulmonary artery systolic pressure. The tricuspid regurgitant velocity is 1.95 m/s, and with an assumed right atrial pressure of 3 mmHg, the estimated right ventricular systolic pressure is 123XX123 mmHg.  Left Atrium: Left atrial size was normal in size.  Right Atrium: Right atrial size was normal in size.  Pericardium: There is no  evidence of pericardial effusion.  Mitral Valve: The mitral valve is normal in structure. Mild mitral valve regurgitation. No evidence of mitral valve stenosis.  Tricuspid Valve: The tricuspid valve is normal in structure. Tricuspid valve regurgitation is not demonstrated. No evidence of tricuspid stenosis.  Aortic Valve: The aortic valve is normal in structure. Aortic valve regurgitation is trivial. Aortic regurgitation PHT measures 805 msec. No aortic stenosis is present. Aortic valve mean gradient measures 5.0 mmHg. Aortic valve peak gradient measures 8.3 mmHg. Aortic valve area, by VTI measures 2.15 cm.  Pulmonic Valve: The pulmonic valve was normal in structure. Pulmonic valve regurgitation is not visualized. No evidence of pulmonic stenosis.  Aorta: The aortic root is normal in size and structure.  Venous: The inferior vena cava is normal in size with greater than 50% respiratory variability, suggesting right atrial pressure of 3 mmHg.  IAS/Shunts: No atrial level shunt detected by color flow Doppler.   LEFT VENTRICLE PLAX 2D LVIDd:         4.65 cm     Diastology LVIDs:         3.50 cm     LV e' medial:    11.30 cm/s LV PW:         1.10 cm     LV E/e' medial:  9.7 LV IVS:        0.95 cm     LV e' lateral:   10.60 cm/s LVOT diam:     1.90 cm     LV E/e' lateral: 10.4 LV SV:         45 LV SV Index:   21 LVOT Area:     2.84 cm  LV Volumes (MOD) LV vol d,  MOD A2C: 90.6 ml LV vol d, MOD A4C: 85.3 ml LV vol s, MOD A2C: 47.8 ml LV vol s, MOD A4C: 47.6 ml LV SV MOD A2C:     42.8 ml LV SV MOD A4C:     85.3 ml LV SV MOD BP:      40.0 ml  RIGHT VENTRICLE RV Basal diam:  3.40 cm RV Mid diam:    2.50 cm RV S prime:     10.20 cm/s TAPSE (M-mode): 1.3 cm  LEFT ATRIUM           Index        RIGHT ATRIUM           Index LA diam:      3.60 cm 1.71 cm/m   RA Area:     11.00 cm LA Vol (A4C): 35.0 ml 16.62 ml/m  RA Volume:   23.60 ml  11.21 ml/m AORTIC VALVE                     PULMONIC VALVE AV Area (Vmax):    1.94 cm      PV Vmax:       1.15 m/s AV Area (Vmean):   1.91 cm      PV Peak grad:  5.3 mmHg AV Area (VTI):     2.15 cm AV Vmax:           144.00 cm/s AV Vmean:          109.000 cm/s AV VTI:            0.210 m AV Peak Grad:      8.3 mmHg AV Mean Grad:      5.0 mmHg LVOT Vmax:  98.30 cm/s LVOT Vmean:        73.300 cm/s LVOT VTI:          0.159 m LVOT/AV VTI ratio: 0.76 AI PHT:            805 msec  AORTA Ao Root diam: 2.80 cm Ao Asc diam:  2.60 cm  MITRAL VALVE                TRICUSPID VALVE MV Area (PHT): 4.29 cm     TR Peak grad:   15.2 mmHg MV Decel Time: 177 msec     TR Vmax:        195.00 cm/s MR Peak grad: 84.6 mmHg MR Vmax:      460.00 cm/s   SHUNTS MV E velocity: 110.00 cm/s  Systemic VTI:  0.16 m MV A velocity: 47.60 cm/s   Systemic Diam: 1.90 cm MV E/A ratio:  2.31  Kardie Tobb DO Electronically signed by Berniece Salines DO Signature Date/Time: 10/09/2022/5:10:12 PM    Final    MONITORS  LONG TERM MONITOR (3-14 DAYS) 11/06/2022  Narrative   Predominatly sinus rhythm   Frequent episodes of paroxysmal atrial fibrillation   She had 360 pauses - the longest pause ws 8.5 seconds.   these pauses were predominately nocturnal   Patient triggers were associated with atrial fib and junctional rhythm   The patient has been referred to EP   Patch Wear Time:  13 days and 20 hours (2024-01-04T13:58:20-0500 to  2024-01-18T10:29:18-0500)  Patient had a min HR of 38 bpm, max HR of 174 bpm, and avg HR of 75 bpm. Predominant underlying rhythm was Sinus Rhythm. 1 run of Ventricular Tachycardia occurred lasting 5 beats with a max rate of 122 bpm (avg 104 bpm). Atrial Fibrillation occurred (19% burden), ranging from 38-174 bpm (avg of 88 bpm), the longest lasting 8 hours 19 mins with an avg rate of 83 bpm. 360 Pauses occurred, the longest lasting 8.5 secs (7 bpm). Second Degree AV Block-Mobitz I (Wenckebach) was present. Junctional Rhythm was present. Atrial Fibrillation and Junctional Rhythm were detected within +/- 45 seconds of symptomatic patient event(s). Isolated SVEs were rare (<1.0%, 14193), SVE Couplets were rare (<1.0%, 200), and SVE Triplets were rare (<1.0%, 95). Isolated VEs were occasional (1.0%, 14741), VE Couplets were rare (<1.0%, 67), and no VE Triplets were present. Ventricular Trigeminy was present. Difficulty discerning atrial activity making definitive diagnosis difficult to ascertain. MD notification criteria for Pauses met - report posted prior to notification per account request (NB).           EKG:  Last EKG results: today - AF   Recent Labs: 10/09/2022: B Natriuretic Peptide 512.3 10/11/2022: Magnesium 1.7 10/16/2022: ALT 22 12/15/2022: BUN 23; Creatinine, Ser 1.25; Hemoglobin 12.4; Platelets 253; Potassium 4.0; Sodium 137     Physical Exam:    VS:  BP (!) 144/82   Pulse 88   Ht '5\' 7"'$  (1.702 m)   Wt 234 lb 9.6 oz (106.4 kg)   SpO2 99%   BMI 36.74 kg/m     Wt Readings from Last 3 Encounters:  12/22/22 234 lb 9.6 oz (106.4 kg)  12/22/22 236 lb 3.2 oz (107.1 kg)  12/15/22 230 lb (104.3 kg)     GEN: Well nourished, well developed in no acute distress CARDIAC: iRRR, no murmurs, rubs, gallops RESPIRATORY:  Normal work of breathing MUSCULOSKELETAL: no edema    ASSESSMENT & PLAN:    Atrial fibrillation: mild symptoms in fib, but  having dizziness with flecainide, and  tachy-brady is an issues, particularly with post-conversion pauses. We discussed the indication, rationale, logistics, anticipated benefits, and potential risks of the ablation procedure including but not limited to -- bleed at the groin access site, chest pain, damage to nearby organs such as the diaphragm, lungs, or esophagus, need for a drainage tube, or prolonged hospitalization. I explained that the risk for stroke, heart attack, need for open chest surgery, or even death is very low but not zero. she  expressed understanding and wishes to proceed.  Tachy-brady syndrome: pauses of up to 8.5 seconds at night. No syncope other than one episodes that occurred when the patient was dehydrated and suffering from influenza. Ideally, we would manage with rhythm control. Will plan to stop flecainide with ablation.        Medication Adjustments/Labs and Tests Ordered: Current medicines are reviewed at length with the patient today.  Concerns regarding medicines are outlined above.  No orders of the defined types were placed in this encounter.  No orders of the defined types were placed in this encounter.    Signed, Melida Quitter, MD  12/22/2022 12:06 PM    [place]

## 2022-12-22 NOTE — Progress Notes (Signed)
Established Patient Visit  Patient: Stacy Chapman   DOB: 04-29-1956   67 y.o. Female  MRN: [id number] Visit Date: 12/22/2022  Subjective:    Chief Complaint  Patient presents with   Cough    Productive cough with white mucus for 8 days.    HPI Mild intermittent asthma with acute exacerbation Chronic cough waxing and waning, worse in AM, productive with clear mucus Some improvement with benzonatate Has intermittent heartburn and belching, some improvement with tums No fever, no SOB, no night sweats,no unintended weight loss. Hx of sinusitis (denies any sinus congestion of pressure or post nasal drainage today), current use of montelukast CXR completed 11/2022: no acute finding. Has upcoming appt for CT chest and PFT per pulmonology.  GERD vs Asthma vs sarcoidosis? Try famotidine and advair x 2-4weeks. Advised to avoid GERD trigger foods and not to eat within 2hrs of bedtime Defer to pulmonology if no improvement  Wt Readings from Last 3 Encounters:  12/22/22 236 lb 3.2 oz (107.1 kg)  12/15/22 230 lb (104.3 kg)  12/12/22 235 lb 3.2 oz (106.7 kg)    Reviewed medical, surgical, and social history today  Medications: Outpatient Medications Prior to Visit  Medication Sig   acetaminophen (TYLENOL) 500 MG tablet Take 1,000 mg by mouth as needed for moderate pain.   albuterol (VENTOLIN HFA) 108 (90 Base) MCG/ACT inhaler Inhale 1-2 puffs into the lungs every 6 (six) hours as needed for wheezing or shortness of breath.   azelastine (ASTELIN) 0.1 % nasal spray Place 1 spray into both nostrils 2 (two) times daily. Use in each nostril as directed   Biotin 2.5 MG TABS Take 1 tablet by mouth daily.   diltiazem (CARDIZEM CD) 120 MG 24 hr capsule TAKE 1 CAPSULE BY MOUTH DAILY   ferrous gluconate (FERGON) 324 MG tablet Take 324 mg by mouth daily with breakfast.   flecainide (TAMBOCOR) 50 MG tablet Take 1.5 tablets (75 mg total) by mouth 2 (two) times daily.   fluticasone  (FLONASE) 50 MCG/ACT nasal spray SHAKE LIQUID AND USE 2 SPRAYS IN EACH NOSTRIL DAILY   furosemide (LASIX) 20 MG tablet TAKE 1 TABLET BY MOUTH DAILY AS NEEDED FOR SWELLING OR WEIGHT GAIN   montelukast (SINGULAIR) 10 MG tablet Take 1 tablet (10 mg total) by mouth at bedtime.   Multiple Vitamin (MULTIVITAMIN) capsule Take 1 capsule by mouth daily.   rosuvastatin (CRESTOR) 10 MG tablet Take 1 tablet (10 mg total) by mouth daily.   warfarin (COUMADIN) 5 MG tablet TAKE 1 TABLET TO 2 TABLETS BY MOUTH DAILY AS DIRECTED BY COUMADIN CLINIC   [DISCONTINUED] benzonatate (TESSALON) 200 MG capsule Take 1 capsule (200 mg total) by mouth 3 (three) times daily as needed. (Patient not taking: Reported on 12/12/2022)   No facility-administered medications prior to visit.   Reviewed past medical and social history.   ROS per HPI above      Objective:  BP 124/76 (BP Location: Left Arm, Patient Position: Sitting, Cuff Size: Large)   Pulse 68   Temp 98.1 F (36.7 C) (Oral)   Resp 16   Ht '5\' 7"'$  (1.702 m)   Wt 236 lb 3.2 oz (107.1 kg)   SpO2 97%   BMI 36.99 kg/m      Physical Exam Vitals reviewed.  Cardiovascular:     Rate and Rhythm: Normal rate and regular rhythm.     Pulses: Normal pulses.  Heart sounds: Normal heart sounds.  Pulmonary:     Effort: Pulmonary effort is normal.     Breath sounds: Normal breath sounds.  Abdominal:     General: There is no distension.     Palpations: Abdomen is soft.     Tenderness: There is no abdominal tenderness.  Neurological:     Mental Status: She is alert and oriented to person, place, and time.     No results found for any visits on 12/22/22.    Assessment & Plan:    Problem List Items Addressed This Visit       Respiratory   Mild intermittent asthma with acute exacerbation    Chronic cough waxing and waning, worse in AM, productive with clear mucus Some improvement with benzonatate Has intermittent heartburn and belching, some improvement  with tums No fever, no SOB, no night sweats,no unintended weight loss. Hx of sinusitis (denies any sinus congestion of pressure or post nasal drainage today), current use of montelukast CXR completed 11/2022: no acute finding. Has upcoming appt for CT chest and PFT per pulmonology.  GERD vs Asthma vs sarcoidosis? Try famotidine and advair x 2-4weeks. Advised to avoid GERD trigger foods and not to eat within 2hrs of bedtime Defer to pulmonology if no improvement      Relevant Medications   fluticasone-salmeterol (ADVAIR HFA) 115-21 MCG/ACT inhaler     Other   Chronic cough - Primary   Relevant Medications   fluticasone-salmeterol (ADVAIR HFA) 115-21 MCG/ACT inhaler   famotidine (PEPCID) 20 MG tablet   benzonatate (TESSALON) 200 MG capsule   Return if symptoms worsen or fail to improve.     Wilfred Lacy, NP

## 2022-12-24 ENCOUNTER — Telehealth: Payer: Self-pay

## 2022-12-24 NOTE — Telephone Encounter (Signed)
Called pt to get her procedure scheduled but she was at work and would like to call back when it's a better time.

## 2023-01-02 ENCOUNTER — Ambulatory Visit (HOSPITAL_COMMUNITY): Admission: RE | Admit: 2023-01-02 | Payer: BC Managed Care – PPO | Source: Ambulatory Visit

## 2023-01-08 ENCOUNTER — Other Ambulatory Visit: Payer: Self-pay | Admitting: Nurse Practitioner

## 2023-01-08 ENCOUNTER — Ambulatory Visit: Payer: BC Managed Care – PPO | Attending: Cardiology | Admitting: *Deleted

## 2023-01-08 DIAGNOSIS — I480 Paroxysmal atrial fibrillation: Secondary | ICD-10-CM

## 2023-01-08 DIAGNOSIS — J4521 Mild intermittent asthma with (acute) exacerbation: Secondary | ICD-10-CM

## 2023-01-08 DIAGNOSIS — Z5181 Encounter for therapeutic drug level monitoring: Secondary | ICD-10-CM

## 2023-01-08 LAB — POCT INR: POC INR: 1.9

## 2023-01-08 NOTE — Patient Instructions (Signed)
Description   Take 2.5 tablets of warfarin today and then continue taking 1 tablet daily except 2 tablets only on Sundays and Thursdays. Recheck INR in 3 weeks.   Resume normal green intake then be consistent with your leafy veggies and fruit smoothies (2-3 servings per week).  [facility] [phone]

## 2023-01-12 ENCOUNTER — Other Ambulatory Visit: Payer: Self-pay | Admitting: Nurse Practitioner

## 2023-01-12 DIAGNOSIS — J4521 Mild intermittent asthma with (acute) exacerbation: Secondary | ICD-10-CM

## 2023-01-15 ENCOUNTER — Ambulatory Visit (INDEPENDENT_AMBULATORY_CARE_PROVIDER_SITE_OTHER): Payer: BC Managed Care – PPO | Admitting: Nurse Practitioner

## 2023-01-15 ENCOUNTER — Encounter: Payer: Self-pay | Admitting: Nurse Practitioner

## 2023-01-15 VITALS — BP 142/80 | HR 84 | Temp 98.7°F | Resp 16 | Ht 67.0 in | Wt 239.0 lb

## 2023-01-15 DIAGNOSIS — E785 Hyperlipidemia, unspecified: Secondary | ICD-10-CM | POA: Diagnosis not present

## 2023-01-15 DIAGNOSIS — R053 Chronic cough: Secondary | ICD-10-CM | POA: Diagnosis not present

## 2023-01-15 DIAGNOSIS — E1169 Type 2 diabetes mellitus with other specified complication: Secondary | ICD-10-CM

## 2023-01-15 DIAGNOSIS — R030 Elevated blood-pressure reading, without diagnosis of hypertension: Secondary | ICD-10-CM | POA: Insufficient documentation

## 2023-01-15 LAB — LIPID PANEL
Cholesterol: 163 mg/dL (ref 0–200)
HDL: 73.6 mg/dL (ref >39.00)
LDL Cholesterol: 76 mg/dL (ref 0–99)
NonHDL: 89.22
Total CHOL/HDL Ratio: 2
Triglycerides: 66 mg/dL (ref 0.0–149.0)
VLDL: 13.2 mg/dL (ref 0.0–40.0)

## 2023-01-15 LAB — HEMOGLOBIN A1C: Hgb A1c MFr Bld: 6.1 % (ref 4.6–6.5)

## 2023-01-15 MED ORDER — BENZONATATE 200 MG PO CAPS: 200.0000 mg | ORAL_CAPSULE | Freq: Two times a day (BID) | ORAL | 1 refills | Status: DC | PRN

## 2023-01-15 NOTE — Progress Notes (Signed)
Established Patient Visit  Patient: Stacy Chapman   DOB: [birth year]   67 y.o. Female  MRN: [id number] Visit Date: 01/15/2023  Subjective:    Chief Complaint  Patient presents with   Medical Management of Chronic Issues    Fasting- yes  refill - tessalon perles   HPI Type 2 diabetes mellitus with other specified complication, without long-term current use of insulin (HCC) Diet controlled Home fasting glucose: 90s-100s No neuropathy or retinopathy or nephropathy. LDL at goal with crestor  Repeat hgbA1c and lipid panel today  Chronic cough Controlled with use of benzonatate BID prn. Refill sent Has upcoming appt fof CT chest and pulmonary function test. Under the care of pulmonology: Dr. Pennie Banter  Elevated BP without diagnosis of hypertension Advised to Monitor BP at home in AM, maintain DASh diet Call office if BP remains >140/80. BP Readings from Last 3 Encounters:  01/15/23 (!) 142/80  12/22/22 (!) 144/82  12/22/22 124/76      Reviewed medical, surgical, and social history today  Medications: Outpatient Medications Prior to Visit  Medication Sig   gabapentin (NEURONTIN) 300 MG capsule Take 300 mg by mouth at bedtime.   acetaminophen (TYLENOL) 500 MG tablet Take 1,000 mg by mouth as needed for moderate pain.   albuterol (VENTOLIN HFA) 108 (90 Base) MCG/ACT inhaler INHALE 1 TO 2 PUFFS INTO THE LUNGS EVERY 6 HOURS AS NEEDED FOR WHEEZING OR SHORTNESS OF BREATH   azelastine (ASTELIN) 0.1 % nasal spray Place 1 spray into both nostrils 2 (two) times daily. Use in each nostril as directed   Biotin 2.5 MG TABS Take 1 tablet by mouth daily.   diltiazem (CARDIZEM CD) 120 MG 24 hr capsule TAKE 1 CAPSULE BY MOUTH DAILY   famotidine (PEPCID) 20 MG tablet Take 1 tablet (20 mg total) by mouth 2 (two) times daily.   ferrous gluconate (FERGON) 324 MG tablet Take 324 mg by mouth daily with breakfast.   flecainide (TAMBOCOR) 50 MG tablet Take 1.5 tablets (75 mg total)  by mouth 2 (two) times daily.   fluticasone (FLONASE) 50 MCG/ACT nasal spray SHAKE LIQUID AND USE 2 SPRAYS IN EACH NOSTRIL DAILY   fluticasone-salmeterol (ADVAIR HFA) 115-21 MCG/ACT inhaler Inhale 2 puffs into the lungs 2 (two) times daily. Rinse mouth after each use   furosemide (LASIX) 20 MG tablet TAKE 1 TABLET BY MOUTH DAILY AS NEEDED FOR SWELLING OR WEIGHT GAIN   montelukast (SINGULAIR) 10 MG tablet TAKE 1 TABLET(10 MG) BY MOUTH AT BEDTIME   Multiple Vitamin (MULTIVITAMIN) capsule Take 1 capsule by mouth daily.   rosuvastatin (CRESTOR) 10 MG tablet Take 1 tablet (10 mg total) by mouth daily.   warfarin (COUMADIN) 5 MG tablet TAKE 1 TABLET TO 2 TABLETS BY MOUTH DAILY AS DIRECTED BY COUMADIN CLINIC   [DISCONTINUED] benzonatate (TESSALON) 200 MG capsule Take 1 capsule (200 mg total) by mouth 3 (three) times daily as needed. (Patient not taking: Reported on 01/15/2023)   No facility-administered medications prior to visit.   Reviewed past medical and social history.   ROS per HPI above  Last metabolic panel Lab Results  Component Value Date   GLUCOSE 112 (H) 12/15/2022   NA 137 12/15/2022   K 4.0 12/15/2022   CL 104 12/15/2022   CO2 23 12/15/2022   BUN 23 12/15/2022   CREATININE 1.25 (H) 12/15/2022   GFRNONAA 48 (L) 12/15/2022   CALCIUM 9.5 12/15/2022  PROT 7.3 10/16/2022   ALBUMIN 3.5 10/16/2022   BILITOT 0.4 10/16/2022   ALKPHOS 36 (L) 10/16/2022   AST 17 10/16/2022   ALT 22 10/16/2022   ANIONGAP 10 12/15/2022   Last lipids Lab Results  Component Value Date   CHOL 153 07/14/2022   HDL 61.80 07/14/2022   LDLCALC 77 07/14/2022   TRIG 71.0 07/14/2022   CHOLHDL 2 07/14/2022   Last hemoglobin A1c Lab Results  Component Value Date   HGBA1C 6.5 10/16/2022        Objective:  BP (!) 142/80   Pulse 84   Temp 98.7 F (37.1 C) (Temporal)   Resp 16   Ht 5\' 7"  (1.702 m)   Wt 239 lb (108.4 kg)   SpO2 98%   BMI 37.43 kg/m      Physical Exam Vitals and nursing note  reviewed.  Cardiovascular:     Rate and Rhythm: Normal rate. Rhythm irregular.     Pulses: Normal pulses.     Heart sounds: Normal heart sounds.  Pulmonary:     Effort: Pulmonary effort is normal.     Breath sounds: Normal breath sounds.  Musculoskeletal:     Right lower leg: No edema.     Left lower leg: No edema.  Neurological:     Mental Status: She is alert and oriented to person, place, and time.     No results found for any visits on 01/15/23.    Assessment & Plan:    Problem List Items Addressed This Visit       Endocrine   Type 2 diabetes mellitus with other specified complication, without long-term current use of insulin    Diet controlled Home fasting glucose: 90s-100s No neuropathy or retinopathy or nephropathy. LDL at goal with crestor  Repeat hgbA1c and lipid panel today      Relevant Orders   Hemoglobin A1c     Other   Hyperlipidemia (Chronic)   Chronic cough - Primary    Controlled with use of benzonatate BID prn. Refill sent Has upcoming appt fof CT chest and pulmonary function test. Under the care of pulmonology: Dr. Pennie Banter      Relevant Medications   benzonatate (TESSALON) 200 MG capsule   Elevated BP without diagnosis of hypertension    Advised to Monitor BP at home in AM, maintain DASh diet Call office if BP remains >140/80. BP Readings from Last 3 Encounters:  01/15/23 (!) 142/80  12/22/22 (!) 144/82  12/22/22 124/76         Return in about 6 months (around 07/17/2023) for HTN, DM, hyperlipidemia (fasting).     Wilfred Lacy, NP

## 2023-01-15 NOTE — Assessment & Plan Note (Signed)
Advised to Monitor BP at home in AM, maintain DASh diet Call office if BP remains >140/80. BP Readings from Last 3 Encounters:  01/15/23 (!) 142/80  12/22/22 (!) 144/82  12/22/22 124/76

## 2023-01-15 NOTE — Assessment & Plan Note (Signed)
Controlled with use of benzonatate BID prn. Refill sent Has upcoming appt fof CT chest and pulmonary function test. Under the care of pulmonology: Dr. Pennie Banter

## 2023-01-15 NOTE — Patient Instructions (Signed)
Monitor BP at home in AM Call office if BP remains >140/80. Maintain current medications Schedule appt for pulmonary function test and for bone density. Go to lab

## 2023-01-15 NOTE — Assessment & Plan Note (Signed)
Diet controlled Home fasting glucose: 90s-100s No neuropathy or retinopathy or nephropathy. LDL at goal with crestor  Repeat hgbA1c and lipid panel today

## 2023-01-16 NOTE — Progress Notes (Signed)
Stable Follow instructions as discussed during office visit.

## 2023-01-19 ENCOUNTER — Ambulatory Visit: Payer: BC Managed Care – PPO | Admitting: Cardiovascular Disease

## 2023-01-22 ENCOUNTER — Ambulatory Visit (HOSPITAL_COMMUNITY)
Admission: RE | Admit: 2023-01-22 | Discharge: 2023-01-22 | Disposition: A | Discharge status: Home / Self Care | Payer: BC Managed Care – PPO | Source: Ambulatory Visit | Attending: Physician Assistant | Admitting: Physician Assistant

## 2023-01-22 ENCOUNTER — Other Ambulatory Visit (HOSPITAL_COMMUNITY): Payer: Self-pay | Admitting: *Deleted

## 2023-01-22 ENCOUNTER — Other Ambulatory Visit: Payer: Self-pay | Admitting: Nurse Practitioner

## 2023-01-22 DIAGNOSIS — I10 Essential (primary) hypertension: Secondary | ICD-10-CM

## 2023-01-22 DIAGNOSIS — I083 Combined rheumatic disorders of mitral, aortic and tricuspid valves: Secondary | ICD-10-CM | POA: Insufficient documentation

## 2023-01-22 DIAGNOSIS — I4819 Other persistent atrial fibrillation: Secondary | ICD-10-CM | POA: Insufficient documentation

## 2023-01-22 DIAGNOSIS — I340 Nonrheumatic mitral (valve) insufficiency: Secondary | ICD-10-CM

## 2023-01-22 DIAGNOSIS — E1169 Type 2 diabetes mellitus with other specified complication: Secondary | ICD-10-CM

## 2023-01-22 DIAGNOSIS — E119 Type 2 diabetes mellitus without complications: Secondary | ICD-10-CM | POA: Diagnosis not present

## 2023-01-22 LAB — ECHOCARDIOGRAM COMPLETE
AR max vel: 1.64 cm2
AV Area VTI: 1.54 cm2
AV Area mean vel: 1.53 cm2
AV Mean grad: 4 mmHg
AV Peak grad: 7.1 mmHg
Ao pk vel: 1.33 m/s
Area-P 1/2: 4.85 cm2
Calc EF: 59.7 %
MV M vel: 5.71 m/s
MV Peak grad: 130.4 mmHg
MV VTI: 1.52 cm2
P 1/2 time: 379 msec
Radius: 0.3 cm
S' Lateral: 3.7 cm
Single Plane A2C EF: 62.8 %
Single Plane A4C EF: 54.9 %

## 2023-01-29 ENCOUNTER — Other Ambulatory Visit: Payer: Self-pay | Admitting: Nurse Practitioner

## 2023-01-29 ENCOUNTER — Ambulatory Visit: Payer: BC Managed Care – PPO

## 2023-01-29 DIAGNOSIS — R053 Chronic cough: Secondary | ICD-10-CM

## 2023-01-29 DIAGNOSIS — J4521 Mild intermittent asthma with (acute) exacerbation: Secondary | ICD-10-CM

## 2023-02-05 ENCOUNTER — Ambulatory Visit: Payer: BC Managed Care – PPO | Attending: Cardiology | Admitting: *Deleted

## 2023-02-05 DIAGNOSIS — Z5181 Encounter for therapeutic drug level monitoring: Secondary | ICD-10-CM

## 2023-02-05 DIAGNOSIS — I480 Paroxysmal atrial fibrillation: Secondary | ICD-10-CM | POA: Diagnosis not present

## 2023-02-05 LAB — POCT INR: POC INR: 2.1

## 2023-02-05 NOTE — Patient Instructions (Signed)
Description   Continue taking 1 tablet daily except 2 tablets only on Sundays and Thursdays. Recheck INR in 4 weeks.   Resume normal green intake then be consistent with your leafy veggies and fruit smoothies (2-3 servings per week).  [facility] [phone]      

## 2023-02-12 ENCOUNTER — Ambulatory Visit
Admission: RE | Admit: 2023-02-12 | Discharge: 2023-02-12 | Disposition: A | Discharge status: Home / Self Care | Payer: BC Managed Care – PPO | Source: Ambulatory Visit | Attending: Pulmonary Disease | Admitting: Pulmonary Disease

## 2023-02-12 DIAGNOSIS — R918 Other nonspecific abnormal finding of lung field: Secondary | ICD-10-CM

## 2023-02-12 DIAGNOSIS — R911 Solitary pulmonary nodule: Secondary | ICD-10-CM | POA: Diagnosis not present

## 2023-02-12 DIAGNOSIS — I700 Atherosclerosis of aorta: Secondary | ICD-10-CM | POA: Diagnosis not present

## 2023-02-16 ENCOUNTER — Ambulatory Visit: Payer: BC Managed Care – PPO | Attending: Internal Medicine | Admitting: Internal Medicine

## 2023-02-16 ENCOUNTER — Encounter: Payer: Self-pay | Admitting: Internal Medicine

## 2023-02-16 VITALS — BP 126/78 | HR 74 | Ht 67.0 in | Wt 233.0 lb

## 2023-02-16 DIAGNOSIS — E1169 Type 2 diabetes mellitus with other specified complication: Secondary | ICD-10-CM

## 2023-02-16 DIAGNOSIS — I480 Paroxysmal atrial fibrillation: Secondary | ICD-10-CM

## 2023-02-16 DIAGNOSIS — E785 Hyperlipidemia, unspecified: Secondary | ICD-10-CM

## 2023-02-16 DIAGNOSIS — I340 Nonrheumatic mitral (valve) insufficiency: Secondary | ICD-10-CM

## 2023-02-16 DIAGNOSIS — N1831 Chronic kidney disease, stage 3a: Secondary | ICD-10-CM

## 2023-02-16 DIAGNOSIS — I495 Sick sinus syndrome: Secondary | ICD-10-CM

## 2023-02-16 DIAGNOSIS — Z6836 Body mass index (BMI) 36.0-36.9, adult: Secondary | ICD-10-CM

## 2023-02-16 DIAGNOSIS — Z7985 Long-term (current) use of injectable non-insulin antidiabetic drugs: Secondary | ICD-10-CM

## 2023-02-16 DIAGNOSIS — E119 Type 2 diabetes mellitus without complications: Secondary | ICD-10-CM

## 2023-02-16 DIAGNOSIS — E1159 Type 2 diabetes mellitus with other circulatory complications: Secondary | ICD-10-CM

## 2023-02-16 DIAGNOSIS — I152 Hypertension secondary to endocrine disorders: Secondary | ICD-10-CM

## 2023-02-16 MED ORDER — RIVAROXABAN 20 MG PO TABS: 20.0000 mg | ORAL_TABLET | Freq: Every day | ORAL | 3 refills | Status: DC

## 2023-02-16 MED ORDER — FLECAINIDE ACETATE 50 MG PO TABS: 75.0000 mg | ORAL_TABLET | Freq: Every day | ORAL | 3 refills | Status: DC

## 2023-02-16 MED ORDER — RIVAROXABAN 20 MG PO TABS: 20.0000 mg | ORAL_TABLET | Freq: Every day | ORAL | 0 refills | Status: DC

## 2023-02-16 NOTE — Patient Instructions (Signed)
Medication Instructions:  Your physician has recommended you make the following change in your medication:   Start taking Xarelto 20 mg daily on Wednesday 02/18/23 Stop taking Coumadin  Flecainide 75 mg daily *If you need a refill on your cardiac medications before your next appointment, please call your pharmacy*   Testing/Procedures: ECHO one week prior to f/u appointment Your physician has requested that you have an echocardiogram. Echocardiography is a painless test that uses sound waves to create images of your heart. It provides your doctor with information about the size and shape of your heart and how well your heart's chambers and valves are working. This procedure takes approximately one hour. There are no restrictions for this procedure. Please do NOT wear cologne, perfume, aftershave, or lotions (deodorant is allowed). Please arrive 15 minutes prior to your appointment time.  Follow-Up: At [facility], you and your health needs are our priority.  As part of our continuing mission to provide you with exceptional heart care, we have created designated Provider Care Teams.  These Care Teams include your primary Cardiologist (physician) and Advanced Practice Providers (APPs -  Physician Assistants and Nurse Practitioners) who all work together to provide you with the care you need, when you need it.   Your next appointment:   4 -5 month(s)  Provider:   Dr Lynnette Caffey

## 2023-02-16 NOTE — Progress Notes (Signed)
Patient ID: Stacy Chapman MRN: [id number] DOB/AGE: 1955/12/10 67 y.o.  Primary Care Physician:Nche, Bonna Gains, NP Primary Cardiologist: Kristeen Miss, MD Referring Cardiologist: Halford Chessman, MD   FOCUSED CARDIOVASCULAR PROBLEM LIST:   1.  Atrial fibrillation on warfarin (unable to afford DOAC) with tachy-bradycardia syndrome; CV 2 score of 7 2.  Iron deficiency anemia 3.  Hyperlipidemia 4.  Aortic atherosclerosis on CT abdomen pelvis 2023 5.  BMI of 36 6.  Type 2 diabetes diet controlled 7.  Hypertension 8.  Stage III chronic kidney disease 9.  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 female with the indicated medical history here for recommendations regarding the patient's severe mitral regurgitation seen on recent echocardiogram.  The patient has been followed by electrophysiology department for some time.  She is initially diagnosed with atrial fibrillation in 2020.  She was started on Xarelto.  Later she was started on flecainide.  It looks like she was eventually changed to Coumadin due to financial issues with Xarelto.  She was seen by Dr. Hyacinth Meeker recently.  Monitor demonstrated fairly significant pauses and for this reason plan was conceived to pursue atrial fibrillation ablation.  An echocardiogram was performed which demonstrated significant mitral regurgitation different than her previous echocardiogram.  The patient works full-time at [employer].  She walks quite a bit every day.  She has not really noticed any increasing shortness of breath.  On occasion she will get short of breath when she goes to the bathroom.  She will stop and rest and then resume.  She has had no dizzy spells since seeing EP recently.  She has not had any bleeding issues while on Coumadin.  She is required no emergency room visits or hospitalizations.  In general she is able to do all of her activities of daily living.   Past  Medical History:  Diagnosis Date   Anemia    Arthritis    Asthma    Diabetes (HCC)    Diarrhea of presumed infectious origin 10/09/2022   Essential hypertension    Morbid obesity (HCC)    Persistent atrial fibrillation (HCC)    Stroke (HCC) 2018   TIA    Past Surgical History:  Procedure Laterality Date   KNEE ARTHROSCOPY     TONSILLECTOMY     TUBAL LIGATION  1991    Family History  Problem Relation Age of Onset   Stroke Father 51       death   Hypertension Father    Dementia Sister    Diabetes Sister    Eczema Sister    Dementia Sister    Cancer Paternal Uncle        unknown   Hypertension Brother    Stroke Brother 17       death   Cancer Brother        lung   Colon cancer Neg Hx    Colon polyps Neg Hx    Stomach cancer Neg Hx    Rectal cancer Neg Hx    Esophageal cancer Neg Hx    Breast cancer Neg Hx     Social History   Socioeconomic History   Marital status: Divorced    Spouse name: Not on file   Number of children: Not on file   Years of education: Not on file   Highest education level: Not on file  Occupational History   Not on file  Tobacco Use  Smoking status: Never   Smokeless tobacco: Never   Tobacco comments:    Never smoke 05/01/22  Vaping Use   Vaping Use: Never used  Substance and Sexual Activity   Alcohol use: Not Currently    Alcohol/week: 6.0 standard drinks of alcohol    Types: 1 Glasses of wine, 5 Standard drinks or equivalent per week    Comment: Drinks multiple times a week   Drug use: No   Sexual activity: Yes    Birth control/protection: Post-menopausal, Surgical    Comment: s/p tubal ligation  Other Topics Concern   Not on file  Social History Narrative   Lives in [place] and works in a [employer]   Social Determinants of Health   Financial Resource Strain: Low Risk  (01/15/2023)   Overall Financial Resource Strain (CARDIA)    Difficulty of Paying Living Expenses: Not hard at all  Food Insecurity: No Food  Insecurity (01/15/2023)   Hunger Vital Sign    Worried About Running Out of Food in the Last Year: Never true    Ran Out of Food in the Last Year: Never true  Transportation Needs: No Transportation Needs (10/09/2022)   PRAPARE - Administrator, Civil Service (Medical): No    Lack of Transportation (Non-Medical): No  Physical Activity: Sufficiently Active (01/15/2023)   Exercise Vital Sign    Days of Exercise per Week: 5 days    Minutes of Exercise per Session: 30 min  Stress: Stress Concern Present (01/15/2023)   [employer] of Occupational Health - Occupational Stress Questionnaire    Feeling of Stress : Rather much  Social Connections: Moderately Isolated (01/15/2023)   Social Connection and Isolation Panel [NHANES]    Frequency of Communication with Friends and Family: More than three times a week    Frequency of Social Gatherings with Friends and Family: More than three times a week    Attends Religious Services: More than 4 times per year    Active Member of [employer] or Organizations: No    Attends Banker Meetings: Never    Marital Status: Divorced  Catering manager Violence: Not At Risk (10/09/2022)   Humiliation, Afraid, Rape, and Kick questionnaire    Fear of Current or Ex-Partner: No    Emotionally Abused: No    Physically Abused: No    Sexually Abused: No     Prior to Admission medications   Medication Sig Start Date End Date Taking? Authorizing Provider  acetaminophen (TYLENOL) 500 MG tablet Take 1,000 mg by mouth as needed for moderate pain.    [provider]  albuterol (VENTOLIN HFA) 108 (90 Base) MCG/ACT inhaler INHALE 1 TO 2 PUFFS INTO THE LUNGS EVERY 6 HOURS AS NEEDED FOR WHEEZING OR SHORTNESS OF BREATH 01/08/23   Nche, Bonna Gains, NP  azelastine (ASTELIN) 0.1 % nasal spray Place 1 spray into both nostrils 2 (two) times daily. Use in each nostril as directed 08/11/22   Nche, Bonna Gains, NP  benzonatate (TESSALON) 200 MG capsule  Take 1 capsule (200 mg total) by mouth 2 (two) times daily as needed. 01/15/23   Nche, Bonna Gains, NP  Biotin 2.5 MG TABS Take 1 tablet by mouth daily.    [provider]  diltiazem (CARDIZEM CD) 120 MG 24 hr capsule TAKE 1 CAPSULE BY MOUTH DAILY 10/08/22   Fenton, Clint R, PA  famotidine (PEPCID) 20 MG tablet Take 1 tablet (20 mg total) by mouth 2 (two) times daily. 12/22/22   Nche,  Bonna Gains, NP  ferrous gluconate (FERGON) 324 MG tablet Take 324 mg by mouth daily with breakfast.    [provider]  flecainide (TAMBOCOR) 50 MG tablet Take 1.5 tablets (75 mg total) by mouth 2 (two) times daily. 09/15/22   Fenton, Clint R, PA  fluticasone (FLONASE) 50 MCG/ACT nasal spray SHAKE LIQUID AND USE 2 SPRAYS IN EACH NOSTRIL DAILY 01/24/20   Nche, Bonna Gains, NP  fluticasone-salmeterol (ADVAIR HFA) 115-21 MCG/ACT inhaler Inhale 2 puffs into the lungs 2 (two) times daily. 01/29/23   Nche, Bonna Gains, NP  furosemide (LASIX) 20 MG tablet TAKE 1 TABLET BY MOUTH DAILY AS NEEDED FOR SWELLING OR WEIGHT GAIN 10/14/22   Fenton, Clint R, PA  gabapentin (NEURONTIN) 300 MG capsule Take 300 mg by mouth at bedtime. 01/08/23   [provider]  montelukast (SINGULAIR) 10 MG tablet TAKE 1 TABLET(10 MG) BY MOUTH AT BEDTIME 01/12/23   Nche, Bonna Gains, NP  Multiple Vitamin (MULTIVITAMIN) capsule Take 1 capsule by mouth daily.    [provider]  rosuvastatin (CRESTOR) 10 MG tablet TAKE 1 TABLET(10 MG) BY MOUTH DAILY 01/22/23   Nche, Bonna Gains, NP  warfarin (COUMADIN) 5 MG tablet TAKE 1 TABLET TO 2 TABLETS BY MOUTH DAILY AS DIRECTED BY COUMADIN CLINIC 11/17/22   Nahser, Deloris Ping, MD    Allergies  Allergen Reactions   Lisinopril Cough    REVIEW OF SYSTEMS:  General: no fevers/chills/night sweats Eyes: no blurry vision, diplopia, or amaurosis ENT: no sore throat or hearing loss Resp: no cough, wheezing, or hemoptysis CV: no edema or palpitations GI: no abdominal pain, nausea,  vomiting, diarrhea, or constipation GU: no dysuria, frequency, or hematuria Skin: no rash Neuro: no headache, numbness, tingling, or weakness of extremities Musculoskeletal: no joint pain or swelling Heme: no bleeding, DVT, or easy bruising Endo: no polydipsia or polyuria  BP 126/78   Pulse 74   Ht 5\' 7"  (1.702 m)   Wt 233 lb (105.7 kg)   BMI 36.49 kg/m   PHYSICAL EXAM: GEN:  AO x 3 in no acute distress HEENT: normal Dentition: Normal Neck: JVP normal. +2 carotid upstrokes without bruits. No thyromegaly. Lungs: equal expansion, clear bilaterally CV: Apex is discrete and nondisplaced, irregular RR without murmur or gallop Abd: soft, non-tender, non-distended; no bruit; positive bowel sounds Ext: no edema, ecchymoses, or cyanosis Vascular: 2+ femoral pulses, 2+ radial pulses       Skin: warm and dry without rash Neuro: CN II-XII grossly intact; motor and sensory grossly intact    DATA AND STUDIES:  EKG: March 2024 atrial fibrillation  2D ECHO:  December 2023 1. Left ventricular ejection fraction, by estimation, is 40 to 45%. The  left ventricle has mildly decreased function. The left ventricle  demonstrates global hypokinesis. There is mild concentric left ventricular  hypertrophy. Left ventricular diastolic  parameters are indeterminate.   2. Right ventricular systolic function is normal. The right ventricular  size is normal. There is normal pulmonary artery systolic pressure.   3. The mitral valve is normal in structure. Mild mitral valve  regurgitation. No evidence of mitral stenosis.   4. The aortic valve is normal in structure. Aortic valve regurgitation is  trivial. No aortic stenosis is present.   5. The inferior vena cava is normal in size with greater than 50%  respiratory variability, suggesting right atrial pressure of 3 mmHg.   April 2024 1. Left ventricular ejection fraction, by estimation, is 50 to 55%. The  left  ventricle has low normal function. The  left ventricle has no regional  wall motion abnormalities. Left ventricular diastolic parameters are  indeterminate.   2. Right ventricular systolic function is normal. The right ventricular  size is normal. There is moderately elevated pulmonary artery systolic  pressure.   3. Left atrial size was mildly dilated.   4. Mitral regurgitation worst in the [facility] view. The mitral valve is  grossly normal. Moderate to severe mitral valve regurgitation.   5. Tricuspid valve regurgitation is mild to moderate.   6. The aortic valve was not well visualized. Aortic valve regurgitation  is mild.   TEE:n/a  CARDIAC CATH: n/a  STS RISK CALCULATOR: pending  NHYA CLASS: 2    ASSESSMENT AND PLAN:   Nonrheumatic mitral valve regurgitation - Plan: rivaroxaban (XARELTO) 20 MG TABS tablet, ECHOCARDIOGRAM COMPLETE  Paroxysmal atrial fibrillation (HCC) - Plan: rivaroxaban (XARELTO) 20 MG TABS tablet, ECHOCARDIOGRAM COMPLETE  Tachycardia-bradycardia syndrome (HCC) - Plan: rivaroxaban (XARELTO) 20 MG TABS tablet, ECHOCARDIOGRAM COMPLETE  Type 2 diabetes mellitus without complication, without long-term current use of insulin (HCC)  Hypertension associated with diabetes (HCC)  Hyperlipidemia associated with type 2 diabetes mellitus (HCC)  BMI 36.0-36.9,adult  Stage 3a chronic kidney disease (HCC)  I have viewed the patient's echocardiogram.  It is unclear to me exactly how severe her mitral regurgitation is especially since seems to be mostly in systole for a brief duration.  She certainly has some degree of atrial functional mitral regurgitation and her left atrium is relatively larger.  I think for now the best thing is to have her undergo her ablation procedure.  I will see her back in 3 to 4 months with a plan to obtain another echocardiogram.  It is possible that the atrial fibrillation ablation could result in less mitral regurgitation though I think this is unlikely given the fact her left atria  is relatively large.  While she does have some chronic shortness of breath it is not lifestyle limiting.   For today I did discuss changing the patient from Coumadin to Xarelto.  She is agreeable to this.  I will have her stop Coumadin today with no further doses and start Xarelto on Wednesday.  I have personally reviewed the patients imaging data as summarized above.  I have reviewed the natural history of mitral regurgitation with the patient and family members who are present today. We have discussed the limitations of medical therapy and the poor prognosis associated with symptomatic mitral regurgitation. We have also reviewed potential treatment options, including palliative medical therapy, conventional mitral surgery, and transcatheter mitral edge-to-edge repair. We discussed treatment options in the context of this patient's specific comorbid medical conditions.   All of the patient's questions were answered today. Will make further recommendations based on the results of studies outlined above.   Total time spent with patient today 60 minutes. This includes reviewing records, evaluating the patient and coordinating care.   Orbie Pyo, MD  02/16/2023 2:24 PM    [facility] [address] [place], [place], [place]  [address] Phone: [phone]; Fax: [phone]

## 2023-02-19 ENCOUNTER — Telehealth: Payer: Self-pay

## 2023-02-19 DIAGNOSIS — I4819 Other persistent atrial fibrillation: Secondary | ICD-10-CM

## 2023-02-19 NOTE — Telephone Encounter (Signed)
Pt has been scheduled for an Afib Ablation with Dr. Nelly Laurence on 04/08/23... Labs: 6/12

## 2023-03-03 ENCOUNTER — Other Ambulatory Visit: Payer: Self-pay | Admitting: Internal Medicine

## 2023-03-03 ENCOUNTER — Other Ambulatory Visit: Payer: Self-pay

## 2023-03-03 MED ORDER — RIVAROXABAN 20 MG PO TABS
20.0000 mg | ORAL_TABLET | Freq: Every day | ORAL | 5 refills | Status: DC
  Filled 2023-03-03: qty 30, 30d supply, fill #0

## 2023-03-03 NOTE — Telephone Encounter (Signed)
Prescription refill request for Xarelto received.  Indication:afib Last office visit:5/24 Weight:105.7  kg Age:67 Scr:1.2 CrCl:76.95  ml/min  Prescription refilled

## 2023-03-05 ENCOUNTER — Ambulatory Visit: Payer: BC Managed Care – PPO | Attending: Internal Medicine

## 2023-03-05 ENCOUNTER — Telehealth: Payer: Self-pay | Admitting: *Deleted

## 2023-03-05 NOTE — Telephone Encounter (Signed)
Pt missed appt today. Called pt and LMOM to call office back. Will need to reschedule appt.

## 2023-03-12 ENCOUNTER — Other Ambulatory Visit: Payer: Self-pay

## 2023-03-16 ENCOUNTER — Telehealth: Payer: Self-pay | Admitting: Internal Medicine

## 2023-03-16 NOTE — Telephone Encounter (Signed)
Message received in [facility] Triage, regarding fatigue / weakness / hypotension.    Pt called back.  Pt stated she started Xarelto on 5/8, and is going to have an AFIB Ablation with Dr. Nelly Laurence on 6/26.    Pt c/o having intermittent symptoms of lightheadedness, and fatigued.  Pt states these spells last 20-30 minutes when they occur, but do not happen all the time.  They are happening a little more frequently, and have been occurring since starting Xarelto.   She had an episode of hypotension, BP 83/64, but ate some salty chips, and raised it to 117/81.    Pt advised she did the right thing to treat the hypotension, but Pt symptoms align with known Xarelto side effects.  Will consult Dr. Lynnette Caffey and RN, since pt needs to be on anticoag therapy prior to AFIB Ablation with Dr. Nelly Laurence.  Pt advised we will contact her to follow up after making provider aware.

## 2023-03-16 NOTE — Telephone Encounter (Signed)
Pt c/o medication issue:  1. Name of Medication:   rivaroxaban (XARELTO) 20 MG TABS tablet   2. How are you currently taking this medication (dosage and times per day)?   As prescribed  3. Are you having a reaction (difficulty breathing--STAT)?   4. What is your medication issue?   Patient stated she has been feeling weakness with this medication.  Patient stated her BP 83/64 around 12:30 pm and at 1:30 pm after she had eaten some chips her BP 117/81.  Patient is concerned about using this medication.

## 2023-03-17 ENCOUNTER — Telehealth: Payer: Self-pay | Admitting: Nurse Practitioner

## 2023-03-17 NOTE — Telephone Encounter (Signed)
Caller Name: Armida Tebay Ph #: [phone] Chief Complaint: Pt woke yesterday with her BP at 83/64 she has blurry eyes and is very weak and dizzy. It is a little higher today. 113/81   This call was transferred to Nurse Triage/Access Nurse. This is for documentation purposes. No follow up required at this time.

## 2023-03-17 NOTE — Telephone Encounter (Signed)
Per Message / order received from to investigate Pt symptoms from Dr. Lynnette Caffey, order to obtain a CBC.    Pt has Afib Ablation scheduled with Dr. Nelly Laurence on 6/26, and CBC / BMET was scheduled to be drawn on 6/12.    Pt called and made aware of Dr. Trula Ore orders, and pre-procedure lab draw date changed to 03/18/2023 to assess Pt CBC and BMET.  Will be within 30 days of Afib Ablation appointment.    I will notify Dr. Trula Ore nurse and Procedure scheduling of change in Lab draw date / time.  Follow up required by Dr. Lynnette Caffey to assess Pt CBC and BMET.

## 2023-03-18 ENCOUNTER — Ambulatory Visit: Payer: BC Managed Care – PPO | Attending: Cardiovascular Disease

## 2023-03-18 ENCOUNTER — Telehealth: Payer: Self-pay

## 2023-03-18 DIAGNOSIS — I4819 Other persistent atrial fibrillation: Secondary | ICD-10-CM

## 2023-03-18 LAB — BASIC METABOLIC PANEL
Chloride: 103 mmol/L (ref 96–106)
Glucose: 94 mg/dL (ref 70–99)
Sodium: 138 mmol/L (ref 134–144)

## 2023-03-18 LAB — CBC

## 2023-03-18 NOTE — Telephone Encounter (Signed)
Call received from Front desk regarding Pt concern.    Pt stated today, 03/18/2023 at 200 pm while at work, had an episode of dizziness / lightheadedness, with a visual disturbance of rainbow flashes, with gaze to her upper left.  Pt states she felt fine in the morning, and BP was 136/81;  Pt also states no headache was present, wondering if Pt was experiencing a migraine headache?   Pt states this episode lasted for 30 minutes, and said these episodes are becoming more frequent.  Pt had to sit down, and stop working until her symptoms slowly went away.     [facility] today for CBC and BMET pre-procedure lab draw, and per Dr. Trula Ore orders to assess her CBC vs current symptoms she is having.  I recommended a providers office appointment with an MD or APP, prior to her AFIB Ablation with Dr. Nelly Laurence on 04/08/2023.  Ms. Stacy Chapman of scheduling contacted, and will obtain an appointment with [facility] prior to 6/26. Pt advised for worsening symptoms, or sustained symptoms, to go to the nearest ER for care.  Pt verbalized understanding.

## 2023-03-19 LAB — BASIC METABOLIC PANEL
BUN/Creatinine Ratio: 20 (ref 12–28)
BUN: 21 mg/dL (ref 8–27)
CO2: 23 mmol/L (ref 20–29)
Calcium: 9.5 mg/dL (ref 8.7–10.3)
Creatinine, Ser: 1.05 mg/dL — ABNORMAL HIGH (ref 0.57–1.00)
Potassium: 5.2 mmol/L (ref 3.5–5.2)
eGFR: 59 mL/min/{1.73_m2} — ABNORMAL LOW (ref >59)

## 2023-03-19 LAB — CBC
MCHC: 32.7 g/dL (ref 31.5–35.7)
MCV: 85 fL (ref 79–97)
RBC: 4.17 x10E6/uL (ref 3.77–5.28)
RDW: 13.4 % (ref 11.7–15.4)

## 2023-03-24 NOTE — Progress Notes (Unsigned)
Office Visit    Patient Name: Stacy Chapman Date of Encounter: 03/24/2023  Primary Care Provider:  Anne Ng, NP Primary Cardiologist:  Kristeen Miss, MD Primary Electrophysiologist: None   Past Medical History    Past Medical History:  Diagnosis Date   Anemia    Arthritis    Asthma    Diabetes (HCC)    Diarrhea of presumed infectious origin 10/09/2022   Essential hypertension    Morbid obesity (HCC)    Persistent atrial fibrillation (HCC)    Stroke (HCC) 2018   TIA   Past Surgical History:  Procedure Laterality Date   KNEE ARTHROSCOPY     TONSILLECTOMY     TUBAL LIGATION  1991    Allergies  Allergies  Allergen Reactions   Lisinopril Cough     History of Present Illness    Stacy Chapman  is a 67 year old female with a PMH of PAF (on Coumadin), HTN, DM type II, CVA, asthma, moderate/severe MR, stage III CKD, IDA, aortic atherosclerosis who presents today for complaint of dizziness and presyncope.  [name] was seen initially by consultation in the AF clinic for paroxysmal atrial fibrillation.  She was admitted to the ED 08/2019 after developing shortness of breath and generalized weakness.  EMS completed an EKG that showed atrial fibrillation.  She was discharged with Xarelto.  Patient had experienced 2 close family members passing away the day prior to her episode.  She completed a 2D echo that showed EF of 55 to 60% with mild LAE and moderate MR with moderate TR.  She also wore an event monitor that showed 1% AF burden with single nocturnal pause.  She was scheduled for DCCV 10/2019 but converted to junctional rhythm and procedure was canceled.  She was placed on flecainide for rhythm control but failed and referred to Dr. Johney Frame for evaluation.  She was in agreement at that time for having AF ablation but changed her mind and had quit taking Xarelto due to cost.  She was started on Coumadin and rate was managed with Cardizem and flecainide.   She was seen in the ED on 09/2022 with complaint of AF with RVR.  She underwent a DCCV and converted to sinus rhythm with postconversion pauses.  She also underwent 2D echo that showed EF of 40-45% with global hypokinesis and mild concentric LVH and mild MVR.  She was continued on flecainide despite decreased LV function.  She wore event monitor that showed frequent pauses.She was seen in AF clinic and referred to EP again for consideration of ablation. She was seen by Dr. Lynnette Caffey on 02/16/2023 for severe MR that was seen on recent 2D echo.  It was determined to pursue ablation before undergoing further treatment for MR.  She was switched from Coumadin to Xarelto.  She is scheduled to have AF ablation on 04/08/2023.  Patient contacted our office 03/18/2023 with episode of dizziness and lightheadedness with a visual disturbance of rainbow flashes and left upper gaze.  She reports frequent symptoms since starting Xarelto.  Since last being seen in the office patient reports***.  Patient denies chest pain, palpitations, dyspnea, PND, orthopnea, nausea, vomiting, dizziness, syncope, edema, weight gain, or early satiety.     ***Notes: -Patient reported weakness and dizziness since beginning Xarelto Home Medications    Current Outpatient Medications  Medication Sig Dispense Refill   acetaminophen (TYLENOL) 500 MG tablet Take 1,000 mg by mouth as needed for moderate pain.  albuterol (VENTOLIN HFA) 108 (90 Base) MCG/ACT inhaler INHALE 1 TO 2 PUFFS INTO THE LUNGS EVERY 6 HOURS AS NEEDED FOR WHEEZING OR SHORTNESS OF BREATH 20.1 g 0   azelastine (ASTELIN) 0.1 % nasal spray Place 1 spray into both nostrils 2 (two) times daily. Use in each nostril as directed 90 mL 0   benzonatate (TESSALON) 200 MG capsule Take 1 capsule (200 mg total) by mouth 2 (two) times daily as needed. 60 capsule 1   Biotin 2.5 MG TABS Take 1 tablet by mouth daily.     diltiazem (CARDIZEM CD) 120 MG 24 hr capsule TAKE 1 CAPSULE BY MOUTH  DAILY 90 capsule 2   famotidine (PEPCID) 20 MG tablet Take 1 tablet (20 mg total) by mouth 2 (two) times daily. 30 tablet 0   ferrous gluconate (FERGON) 324 MG tablet Take 324 mg by mouth daily with breakfast.     flecainide (TAMBOCOR) 50 MG tablet Take 1.5 tablets (75 mg total) by mouth daily. 135 tablet 3   fluticasone (FLONASE) 50 MCG/ACT nasal spray SHAKE LIQUID AND USE 2 SPRAYS IN EACH NOSTRIL DAILY 16 g 1   fluticasone-salmeterol (ADVAIR HFA) 115-21 MCG/ACT inhaler Inhale 2 puffs into the lungs 2 (two) times daily. 12 g 5   furosemide (LASIX) 20 MG tablet TAKE 1 TABLET BY MOUTH DAILY AS NEEDED FOR SWELLING OR WEIGHT GAIN 30 tablet 5   gabapentin (NEURONTIN) 300 MG capsule Take 300 mg by mouth at bedtime.     montelukast (SINGULAIR) 10 MG tablet TAKE 1 TABLET(10 MG) BY MOUTH AT BEDTIME 90 tablet 0   Multiple Vitamin (MULTIVITAMIN) capsule Take 1 capsule by mouth daily.     rivaroxaban (XARELTO) 20 MG TABS tablet Take 1 tablet (20 mg total) by mouth daily with supper. 90 tablet 3   rivaroxaban (XARELTO) 20 MG TABS tablet Take 1 tablet (20 mg total) by mouth daily with supper. 30 tablet 5   rosuvastatin (CRESTOR) 10 MG tablet TAKE 1 TABLET(10 MG) BY MOUTH DAILY 90 tablet 1   No current facility-administered medications for this visit.     Review of Systems  Please see the history of present illness.    (+)*** (+)***  All other systems reviewed and are otherwise negative except as noted above.  Physical Exam    Wt Readings from Last 3 Encounters:  02/16/23 233 lb (105.7 kg)  01/15/23 239 lb (108.4 kg)  12/22/22 234 lb 9.6 oz (106.4 kg)   ZO:XWRUE were no vitals filed for this visit.,There is no height or weight on file to calculate BMI.  Constitutional:      Appearance: Healthy appearance. Not in distress.  Neck:     Vascular: JVD normal.  Pulmonary:     Effort: Pulmonary effort is normal.     Breath sounds: No wheezing. No rales. Diminished in the bases Cardiovascular:      Normal rate. Regular rhythm. Normal S1. Normal S2.      Murmurs: There is no murmur.  Edema:    Peripheral edema absent.  Abdominal:     Palpations: Abdomen is soft non tender. There is no hepatomegaly.  Skin:    General: Skin is warm and dry.  Neurological:     General: No focal deficit present.     Mental Status: Alert and oriented to person, place and time.     Cranial Nerves: Cranial nerves are intact.  EKG/LABS/ Recent Cardiac Studies    ECG personally reviewed by me today - ***  Cardiac Studies & Procedures       ECHOCARDIOGRAM  ECHOCARDIOGRAM COMPLETE 01/22/2023  Narrative ECHOCARDIOGRAM REPORT    Patient Name:   CHENISE PERILLI Date of Exam: 01/22/2023 Medical Rec #:  [id number]            Height:       67.0 in Accession #:    [id number]           Weight:       239.0 lb Date of Birth:  09/03/1956            BSA:          2.181 m Patient Age:    66 years             BP:           142/80 mmHg Patient Gender: F                    HR:           87 bpm. Exam Location:  Outpatient  Procedure: 2D Echo, Color Doppler and Cardiac Doppler  Indications:    Atrial Fibrillation I48.19  History:        Patient has prior history of Echocardiogram examinations. Stroke; Risk Factors:Diabetes, Hypertension and Morbid Obesity.  Sonographer:    L. Thornton-Maynard Referring Phys: [id number] CLINT R FENTON  IMPRESSIONS   1. Left ventricular ejection fraction, by estimation, is 50 to 55%. The left ventricle has low normal function. The left ventricle has no regional wall motion abnormalities. Left ventricular diastolic parameters are indeterminate. 2. Right ventricular systolic function is normal. The right ventricular size is normal. There is moderately elevated pulmonary artery systolic pressure. 3. Left atrial size was mildly dilated. 4. Mitral regurgitation worst in the [facility] view. The mitral valve is grossly normal. Moderate to severe mitral valve regurgitation. 5. Tricuspid  valve regurgitation is mild to moderate. 6. The aortic valve was not well visualized. Aortic valve regurgitation is mild.  Comparison(s): Prior images reviewed side by side. Mitrla regurgitation has increased from prior.  FINDINGS Left Ventricle: Left ventricular ejection fraction, by estimation, is 50 to 55%. The left ventricle has low normal function. The left ventricle has no regional wall motion abnormalities. The left ventricular internal cavity size was normal in size. There is no left ventricular hypertrophy. Left ventricular diastolic parameters are indeterminate.  Right Ventricle: The right ventricular size is normal. Right vetricular wall thickness was not well visualized. Right ventricular systolic function is normal. There is moderately elevated pulmonary artery systolic pressure. The tricuspid regurgitant velocity is 3.34 m/s, and with an assumed right atrial pressure of 3 mmHg, the estimated right ventricular systolic pressure is 47.6 mmHg.  Left Atrium: Left atrial size was mildly dilated.  Right Atrium: Right atrial size was normal in size.  Pericardium: There is no evidence of pericardial effusion.  Mitral Valve: Mitral regurgitation worst in the [facility] view. The mitral valve is grossly normal. Moderate to severe mitral valve regurgitation. MV peak gradient, 8.2 mmHg. The mean mitral valve gradient is 3.0 mmHg.  Tricuspid Valve: The tricuspid valve is normal in structure. Tricuspid valve regurgitation is mild to moderate.  Aortic Valve: The aortic valve was not well visualized. Aortic valve regurgitation is mild. Aortic regurgitation PHT measures 379 msec. Aortic valve mean gradient measures 4.0 mmHg. Aortic valve peak gradient measures 7.1 mmHg. Aortic valve area, by VTI measures 1.54 cm.  Pulmonic Valve: The pulmonic valve was not well visualized. Pulmonic valve  regurgitation is not visualized.  Aorta: The aortic root and ascending aorta are structurally normal, with no  evidence of dilitation.  IAS/Shunts: No atrial level shunt detected by color flow Doppler.   LEFT VENTRICLE PLAX 2D LVIDd:         5.50 cm     Diastology LVIDs:         3.70 cm     LV e' medial:    11.90 cm/s LV PW:         1.10 cm     LV E/e' medial:  9.7 LV IVS:        1.00 cm     LV e' lateral:   12.70 cm/s LVOT diam:     1.80 cm     LV E/e' lateral: 9.1 LV SV:         41 LV SV Index:   19 LVOT Area:     2.54 cm  LV Volumes (MOD) LV vol d, MOD A2C: 58.6 ml LV vol d, MOD A4C: 32.8 ml LV vol s, MOD A2C: 21.8 ml LV vol s, MOD A4C: 14.8 ml LV SV MOD A2C:     36.8 ml LV SV MOD A4C:     32.8 ml LV SV MOD BP:      28.8 ml  RIGHT VENTRICLE RV Basal diam:  3.00 cm RV S prime:     10.20 cm/s TAPSE (M-mode): 1.6 cm  LEFT ATRIUM             Index        RIGHT ATRIUM           Index LA diam:        4.20 cm 1.93 cm/m   RA Area:     12.40 cm LA Vol (A2C):   62.9 ml 28.84 ml/m  RA Volume:   27.00 ml  12.38 ml/m LA Vol (A4C):   72.4 ml 33.19 ml/m LA Biplane Vol: 68.7 ml 31.49 ml/m AORTIC VALVE                    PULMONIC VALVE AV Area (Vmax):    1.64 cm     PV Vmax:       0.74 m/s AV Area (Vmean):   1.53 cm     PV Peak grad:  2.2 mmHg AV Area (VTI):     1.54 cm AV Vmax:           133.33 cm/s AV Vmean:          94.267 cm/s AV VTI:            0.268 m AV Peak Grad:      7.1 mmHg AV Mean Grad:      4.0 mmHg LVOT Vmax:         85.70 cm/s LVOT Vmean:        56.600 cm/s LVOT VTI:          0.162 m LVOT/AV VTI ratio: 0.60 AI PHT:            379 msec  AORTA Ao Root diam: 2.60 cm Ao Asc diam:  2.70 cm  MITRAL VALVE                  TRICUSPID VALVE MV Area (PHT): 4.85 cm       TR Peak grad:   44.6 mmHg MV Area VTI:   1.52 cm       TR Vmax:  334.00 cm/s MV Peak grad:  8.2 mmHg MV Mean grad:  3.0 mmHg       SHUNTS MV Vmax:       1.43 m/s       Systemic VTI:  0.16 m MV Vmean:      84.7 cm/s      Systemic Diam: 1.80 cm MV Decel Time: 157 msec MR Peak grad:    130.4  mmHg MR Mean grad:    87.0 mmHg MR Vmax:         571.00 cm/s MR Vmean:        437.0 cm/s MR PISA:         0.57 cm MR PISA Eff ROA: 3 mm MR PISA Radius:  0.30 cm MV E velocity: 115.50 cm/s  Riley Lam MD Electronically signed by Riley Lam MD Signature Date/Time: 01/22/2023/9:40:39 AM    Final    MONITORS  LONG TERM MONITOR (3-14 DAYS) 11/06/2022  Narrative   Predominatly sinus rhythm   Frequent episodes of paroxysmal atrial fibrillation   She had 360 pauses - the longest pause ws 8.5 seconds.   these pauses were predominately nocturnal   Patient triggers were associated with atrial fib and junctional rhythm   The patient has been referred to EP   Patch Wear Time:  13 days and 20 hours (2024-01-04T13:58:20-0500 to 2024-01-18T10:29:18-0500)  Patient had a min HR of 38 bpm, max HR of 174 bpm, and avg HR of 75 bpm. Predominant underlying rhythm was Sinus Rhythm. 1 run of Ventricular Tachycardia occurred lasting 5 beats with a max rate of 122 bpm (avg 104 bpm). Atrial Fibrillation occurred (19% burden), ranging from 38-174 bpm (avg of 88 bpm), the longest lasting 8 hours 19 mins with an avg rate of 83 bpm. 360 Pauses occurred, the longest lasting 8.5 secs (7 bpm). Second Degree AV Block-Mobitz I (Wenckebach) was present. Junctional Rhythm was present. Atrial Fibrillation and Junctional Rhythm were detected within +/- 45 seconds of symptomatic patient event(s). Isolated SVEs were rare (<1.0%, 14193), SVE Couplets were rare (<1.0%, 200), and SVE Triplets were rare (<1.0%, 95). Isolated VEs were occasional (1.0%, 14741), VE Couplets were rare (<1.0%, 67), and no VE Triplets were present. Ventricular Trigeminy was present. Difficulty discerning atrial activity making definitive diagnosis difficult to ascertain. MD notification criteria for Pauses met - report posted prior to notification per account request (NB).           Risk Assessment/Calculations:   {Does this  patient have ATRIAL FIBRILLATION?[phone]}        Lab Results  Component Value Date   WBC 5.7 03/18/2023   HGB 11.6 03/18/2023   HCT 35.5 03/18/2023   MCV 85 03/18/2023   PLT 268 03/18/2023   Lab Results  Component Value Date   CREATININE 1.05 (H) 03/18/2023   BUN 21 03/18/2023   NA 138 03/18/2023   K 5.2 03/18/2023   CL 103 03/18/2023   CO2 23 03/18/2023   Lab Results  Component Value Date   ALT 22 10/16/2022   AST 17 10/16/2022   ALKPHOS 36 (L) 10/16/2022   BILITOT 0.4 10/16/2022   Lab Results  Component Value Date   CHOL 163 01/15/2023   HDL 73.60 01/15/2023   LDLCALC 76 01/15/2023   TRIG 66.0 01/15/2023   CHOLHDL 2 01/15/2023    Lab Results  Component Value Date   HGBA1C 6.1 01/15/2023     Assessment & Plan    1.  Syncope  2.  Persistent  atrial fibrillation  3.  Stage III CKD  4.  Moderate to severe MR      Disposition: Follow-up with Kristeen Miss, MD or APP in *** months {Are you ordering a CV Procedure (e.g. stress test, cath, DCCV, TEE, etc)?   Press F2        :[id number]}   Medication Adjustments/Labs and Tests Ordered: Current medicines are reviewed at length with the patient today.  Concerns regarding medicines are outlined above.   Signed, Napoleon Form, Leodis Rains, NP 03/24/2023, 6:29 PM [facility]

## 2023-03-25 ENCOUNTER — Encounter: Payer: Self-pay | Admitting: Nurse Practitioner

## 2023-03-25 ENCOUNTER — Telehealth: Payer: Self-pay | Admitting: Cardiovascular Disease

## 2023-03-25 ENCOUNTER — Ambulatory Visit: Payer: BC Managed Care – PPO

## 2023-03-25 ENCOUNTER — Ambulatory Visit: Payer: BC Managed Care – PPO | Attending: Nurse Practitioner | Admitting: Nurse Practitioner

## 2023-03-25 VITALS — BP 126/60 | HR 77 | Ht 67.0 in | Wt 236.8 lb

## 2023-03-25 DIAGNOSIS — R55 Syncope and collapse: Secondary | ICD-10-CM

## 2023-03-25 DIAGNOSIS — I4819 Other persistent atrial fibrillation: Secondary | ICD-10-CM | POA: Diagnosis not present

## 2023-03-25 DIAGNOSIS — E782 Mixed hyperlipidemia: Secondary | ICD-10-CM | POA: Diagnosis not present

## 2023-03-25 DIAGNOSIS — I340 Nonrheumatic mitral (valve) insufficiency: Secondary | ICD-10-CM | POA: Diagnosis not present

## 2023-03-25 NOTE — Telephone Encounter (Signed)
Pt called in to go over ablation instructions

## 2023-03-25 NOTE — Patient Instructions (Addendum)
Medication Instructions:  Your physician recommends that you continue on your current medications as directed. Please refer to the Current Medication list given to you today. *If you need a refill on your cardiac medications before your next appointment, please call your pharmacy*   Lab Work: None ordered If you have labs (blood work) drawn today and your tests are completely normal, you will receive your results only by: MyChart Message (if you have MyChart) OR A paper copy in the mail If you have any lab test that is abnormal or we need to change your treatment, we will call you to review the results.   Testing/Procedures: None ordered   Follow-Up: At [facility], you and your health needs are our priority.  As part of our continuing mission to provide you with exceptional heart care, we have created designated Provider Care Teams.  These Care Teams include your primary Cardiologist (physician) and Advanced Practice Providers (APPs -  Physician Assistants and Nurse Practitioners) who all work together to provide you with the care you need, when you need it.  We recommend signing up for the patient portal called "MyChart".  Sign up information is provided on this After Visit Summary.  MyChart is used to connect with patients for Virtual Visits (Telemedicine).  Patients are able to view lab/test results, encounter notes, upcoming appointments, etc.  Non-urgent messages can be sent to your provider as well.   To learn more about what you can do with MyChart, go to [email or web address].    Your next appointment:   FOLLOW UP AS SCHEDULED   Provider:   Alverda Skeans, MD  Other Instructions Increase your hydration Get some compression stockings Please check your weight daily. Please contact the office if you gain more than 3lbs in a day or 5lbs in a week. Limit your salt intake to 1500-2000mg  per day or 500mg  of Sodium per meal.

## 2023-03-26 NOTE — Telephone Encounter (Signed)
LM advising pt that her instruction letters are in her Chart - sent via MyChart per pt's request. She can review those and if she still have questions to call us.

## 2023-03-31 ENCOUNTER — Telehealth (HOSPITAL_COMMUNITY): Payer: Self-pay | Admitting: Emergency Medicine

## 2023-03-31 NOTE — Telephone Encounter (Signed)
Reaching out to patient to offer assistance regarding upcoming cardiac imaging study; pt verbalizes understanding of appt date/time, parking situation and where to check in, pre-test NPO status and medications ordered, and verified current allergies; name and call back number provided for further questions should they arise Kamare Caspers RN Navigator Cardiac Imaging [facility] [phone] office [phone] cell 

## 2023-04-01 ENCOUNTER — Ambulatory Visit (HOSPITAL_COMMUNITY)
Admission: RE | Admit: 2023-04-01 | Discharge: 2023-04-01 | Disposition: A | Discharge status: Home / Self Care | Payer: BC Managed Care – PPO | Source: Ambulatory Visit | Attending: Cardiovascular Disease | Admitting: Cardiovascular Disease

## 2023-04-01 DIAGNOSIS — I4819 Other persistent atrial fibrillation: Secondary | ICD-10-CM | POA: Diagnosis not present

## 2023-04-01 MED ORDER — IOHEXOL 350 MG/ML SOLN
100.0000 mL | Freq: Once | INTRAVENOUS | Status: AC | PRN
  Administered 2023-04-01: 100 mL via INTRAVENOUS

## 2023-04-03 ENCOUNTER — Telehealth: Payer: Self-pay | Admitting: Nurse Practitioner

## 2023-04-03 DIAGNOSIS — Z0279 Encounter for issue of other medical certificate: Secondary | ICD-10-CM

## 2023-04-03 NOTE — Telephone Encounter (Signed)
Patient paid for forms and they are [employer] box. Thank you

## 2023-04-03 NOTE — Telephone Encounter (Signed)
I spoke with patient to let her know that we received  an [employer] disability form.  She said that she would come in either this afternoon or Monday 04-06-23 to pay and sign.

## 2023-04-06 ENCOUNTER — Telehealth: Payer: Self-pay | Admitting: Cardiovascular Disease

## 2023-04-06 NOTE — Telephone Encounter (Signed)
Returned call to patient and reviewed ablation procedure instructions. Sent a copy to patient via MyChart as well to review.  Patient verbalized understanding and expressed appreciation for call.

## 2023-04-06 NOTE — Telephone Encounter (Signed)
Completed [employer] disability form scanned in documents. Patient and billing notified.

## 2023-04-06 NOTE — Telephone Encounter (Signed)
Patient has a few questions regarding upcoming procedure on 6/26 with Dr. Nelly Laurence. She states she hasn't spoken with anyone to discuss instructions and she wants to know if there are any specific instructions. She would also like to know her arrival time and time the procedure will start. She would like to know how long it will last. She would like to know if someone can accompany her. Please advise.

## 2023-04-07 NOTE — Pre-Procedure Instructions (Signed)
Instructed patient on the following items: Arrival time 1230 Nothing to eat or drink after midnight No meds AM of procedure Responsible person to drive you home and stay with you for 24 hrs  Have you missed any doses of anti-coagulant Xarelto- hasn't missed any doses   

## 2023-04-08 ENCOUNTER — Other Ambulatory Visit: Payer: Self-pay

## 2023-04-08 ENCOUNTER — Ambulatory Visit (HOSPITAL_COMMUNITY)
Admission: RE | Admit: 2023-04-08 | Discharge: 2023-04-08 | Disposition: A | Discharge status: Home / Self Care | Payer: BC Managed Care – PPO | Attending: Cardiovascular Disease | Admitting: Cardiovascular Disease

## 2023-04-08 ENCOUNTER — Ambulatory Visit (HOSPITAL_COMMUNITY): Payer: BC Managed Care – PPO | Admitting: Certified Registered Nurse Anesthetist

## 2023-04-08 ENCOUNTER — Encounter (HOSPITAL_COMMUNITY)
Admission: RE | Disposition: A | Discharge status: Home / Self Care | Payer: Self-pay | Source: Home / Self Care | Attending: Cardiovascular Disease

## 2023-04-08 DIAGNOSIS — I10 Essential (primary) hypertension: Secondary | ICD-10-CM | POA: Diagnosis not present

## 2023-04-08 DIAGNOSIS — I495 Sick sinus syndrome: Secondary | ICD-10-CM | POA: Diagnosis not present

## 2023-04-08 DIAGNOSIS — I4819 Other persistent atrial fibrillation: Secondary | ICD-10-CM | POA: Diagnosis not present

## 2023-04-08 DIAGNOSIS — Z7901 Long term (current) use of anticoagulants: Secondary | ICD-10-CM | POA: Insufficient documentation

## 2023-04-08 DIAGNOSIS — I4891 Unspecified atrial fibrillation: Secondary | ICD-10-CM

## 2023-04-08 DIAGNOSIS — D649 Anemia, unspecified: Secondary | ICD-10-CM | POA: Diagnosis not present

## 2023-04-08 DIAGNOSIS — E785 Hyperlipidemia, unspecified: Secondary | ICD-10-CM | POA: Diagnosis not present

## 2023-04-08 HISTORY — PX: ATRIAL FIBRILLATION ABLATION: EP1191

## 2023-04-08 SURGERY — ATRIAL FIBRILLATION ABLATION
Anesthesia: General

## 2023-04-08 MED ORDER — FENTANYL CITRATE (PF) 100 MCG/2ML IJ SOLN
INTRAMUSCULAR | Status: DC | PRN
  Administered 2023-04-08 (×2): 50 ug via INTRAVENOUS

## 2023-04-08 MED ORDER — LIDOCAINE 2% (20 MG/ML) 5 ML SYRINGE
INTRAMUSCULAR | Status: DC | PRN
  Administered 2023-04-08: 20 mg via INTRAVENOUS

## 2023-04-08 MED ORDER — MIDAZOLAM HCL 2 MG/2ML IJ SOLN
INTRAMUSCULAR | Status: DC | PRN
  Administered 2023-04-08: 2 mg via INTRAVENOUS

## 2023-04-08 MED ORDER — HEPARIN (PORCINE) IN NACL 1000-0.9 UT/500ML-% IV SOLN
INTRAVENOUS | Status: DC | PRN
  Administered 2023-04-08 (×4): 500 mL

## 2023-04-08 MED ORDER — ACETAMINOPHEN 325 MG PO TABS: 650.0000 mg | ORAL_TABLET | ORAL | Status: DC | PRN

## 2023-04-08 MED ORDER — SODIUM CHLORIDE 0.9% FLUSH: 3.0000 mL | Freq: Two times a day (BID) | INTRAVENOUS | Status: DC

## 2023-04-08 MED ORDER — HEPARIN SODIUM (PORCINE) 1000 UNIT/ML IJ SOLN
INTRAMUSCULAR | Status: DC | PRN
  Administered 2023-04-08: 18000 [IU] via INTRAVENOUS

## 2023-04-08 MED ORDER — SODIUM CHLORIDE 0.9 % IV SOLN: INTRAVENOUS | Status: DC

## 2023-04-08 MED ORDER — ACETAMINOPHEN 500 MG PO TABS: 1000.0000 mg | ORAL_TABLET | Freq: Once | ORAL | Status: DC

## 2023-04-08 MED ORDER — DOBUTAMINE INFUSION FOR EP/ECHO/NUC (1000 MCG/ML)
INTRAVENOUS | Status: DC | PRN
  Administered 2023-04-08: 20 ug/kg/min via INTRAVENOUS

## 2023-04-08 MED ORDER — COLCHICINE 0.6 MG PO TABS: 0.6000 mg | ORAL_TABLET | Freq: Two times a day (BID) | ORAL | 0 refills | Status: DC

## 2023-04-08 MED ORDER — ONDANSETRON HCL 4 MG/2ML IJ SOLN: 4.0000 mg | Freq: Four times a day (QID) | INTRAMUSCULAR | Status: DC | PRN

## 2023-04-08 MED ORDER — DOBUTAMINE INFUSION FOR EP/ECHO/NUC (1000 MCG/ML)
INTRAVENOUS | Status: AC
  Filled 2023-04-08: qty 250

## 2023-04-08 MED ORDER — PHENYLEPHRINE HCL-NACL 20-0.9 MG/250ML-% IV SOLN
INTRAVENOUS | Status: DC | PRN
  Administered 2023-04-08: 25 ug/min via INTRAVENOUS

## 2023-04-08 MED ORDER — ONDANSETRON HCL 4 MG/2ML IJ SOLN
INTRAMUSCULAR | Status: DC | PRN
  Administered 2023-04-08: 4 mg via INTRAVENOUS

## 2023-04-08 MED ORDER — SODIUM CHLORIDE 0.9% FLUSH: 3.0000 mL | INTRAVENOUS | Status: DC | PRN

## 2023-04-08 MED ORDER — HEPARIN SODIUM (PORCINE) 1000 UNIT/ML IJ SOLN
INTRAMUSCULAR | Status: DC | PRN
  Administered 2023-04-08: 1000 [IU] via INTRAVENOUS

## 2023-04-08 MED ORDER — ROCURONIUM BROMIDE 10 MG/ML (PF) SYRINGE
PREFILLED_SYRINGE | INTRAVENOUS | Status: DC | PRN
  Administered 2023-04-08: 60 mg via INTRAVENOUS

## 2023-04-08 MED ORDER — HEPARIN SODIUM (PORCINE) 1000 UNIT/ML IJ SOLN
INTRAMUSCULAR | Status: AC
  Filled 2023-04-08: qty 10

## 2023-04-08 MED ORDER — SODIUM CHLORIDE 0.9 % IV SOLN: 250.0000 mL | INTRAVENOUS | Status: DC | PRN

## 2023-04-08 MED ORDER — PROPOFOL 10 MG/ML IV BOLUS
INTRAVENOUS | Status: DC | PRN
  Administered 2023-04-08: 40 mg via INTRAVENOUS
  Administered 2023-04-08: 130 mg via INTRAVENOUS

## 2023-04-08 MED ORDER — PROTAMINE SULFATE 10 MG/ML IV SOLN
INTRAVENOUS | Status: DC | PRN
  Administered 2023-04-08 (×8): 10 mg via INTRAVENOUS

## 2023-04-08 MED ORDER — DEXAMETHASONE SODIUM PHOSPHATE 10 MG/ML IJ SOLN
INTRAMUSCULAR | Status: DC | PRN
  Administered 2023-04-08: 10 mg via INTRAVENOUS

## 2023-04-08 MED ORDER — PANTOPRAZOLE SODIUM 40 MG PO TBEC: 40.0000 mg | DELAYED_RELEASE_TABLET | Freq: Every day | ORAL | 0 refills | Status: DC

## 2023-04-08 SURGICAL SUPPLY — 17 items
CATH ABLAT QDOT MICRO BI TC FJ (CATHETERS) IMPLANT
CATH OCTARAY 2.0 F 3-3-3-3-3 (CATHETERS) IMPLANT
CATH PIGTAIL STEERABLE D1 8.7 (WIRE) IMPLANT
CATH S-M CIRCA TEMP PROBE (CATHETERS) IMPLANT
CATH SOUNDSTAR ECO 8FR (CATHETERS) IMPLANT
CATH WEBSTER BI DIR CS D-F CRV (CATHETERS) IMPLANT
CLOSURE PERCLOSE PROSTYLE (VASCULAR PRODUCTS) IMPLANT
COVER SWIFTLINK CONNECTOR (BAG) ×1 IMPLANT
DEVICE CLOSURE MYNXGRIP 6/7F (Vascular Products) IMPLANT
PACK EP LATEX FREE (CUSTOM PROCEDURE TRAY) ×1
PACK EP LF (CUSTOM PROCEDURE TRAY) ×1 IMPLANT
PAD DEFIB RADIO PHYSIO CONN (PAD) ×1 IMPLANT
PATCH CARTO3 (PAD) IMPLANT
SHEATH CARTO VIZIGO MED CURVE (SHEATH) IMPLANT
SHEATH PINNACLE 8F 10CM (SHEATH) IMPLANT
SHEATH PINNACLE 9F 10CM (SHEATH) IMPLANT
TUBING SMART ABLATE COOLFLOW (TUBING) IMPLANT

## 2023-04-08 NOTE — H&P (Signed)
Electrophysiology Office Note:    Date:  04/08/2023   ID:  Stacy Chapman, DOB 12/30/1955, MRN [id number]  PCP:  Anne Ng, NP   [facility] Providers Cardiologist:  Kristeen Miss, MD     Referring MD: Nelly Laurence Roberts Gaudy, MD   History of Present Illness:    Stacy Chapman is a 67 y.o. female with a hx listed below, significant for atrial fibrillation, TIA, referred for arrhythmia management.  She reports that she was initially diagnosed with atrial fibrillation years ago.  In November 2020, she presented to the ER with palpitations and found to be in A-fib.  Xarelto was started at that time.  She was admitted for syncope in December 2023.  She was noted to be in and out of A-fib with occasional posttermination pauses of up to 4 seconds.  She was also suffering influenza.  She had been out of diltiazem and taking only 1 dose of flecainide daily.  She confesses today that she has been taking flecainide only once daily for years.  EF was 40 to 45%.  Her medications were resumed. A Zio patch was placed that showed 90% atrial fibrillation burden with frequent pauses, longest 8.5 seconds.  These pauses occurred during sleep, however.  She is in AF today but not experiencing any fatigue or palpitations today.  She has noted some "wooziness" --she describes this as dizziness when she turns her head.  He notes this has been worse since she started taking flecainide twice daily.  I reviewed the patient's CT and labs. There was no LAA thrombus. she  has not missed any doses of anticoagulation, and she took her dose last night. There have been no changes in the patient's diagnoses, medications, or condition since our recent clinic visit.   Past Medical History:  Diagnosis Date   Anemia    Arthritis    Asthma    Diabetes (HCC)    Diarrhea of presumed infectious origin 10/09/2022   Essential hypertension    Morbid obesity (HCC)    Persistent atrial fibrillation  (HCC)    Stroke (HCC) 2018   TIA    Past Surgical History:  Procedure Laterality Date   KNEE ARTHROSCOPY     TONSILLECTOMY     TUBAL LIGATION  1991    Current Medications: Current Meds  Medication Sig   acetaminophen (TYLENOL) 500 MG tablet Take 1,000 mg by mouth every 8 (eight) hours as needed for moderate pain.   albuterol (VENTOLIN HFA) 108 (90 Base) MCG/ACT inhaler INHALE 1 TO 2 PUFFS INTO THE LUNGS EVERY 6 HOURS AS NEEDED FOR WHEEZING OR SHORTNESS OF BREATH   ascorbic acid (VITAMIN C) 500 MG tablet Take 500 mg by mouth daily.   azelastine (ASTELIN) 0.1 % nasal spray Place 1 spray into both nostrils 2 (two) times daily. Use in each nostril as directed   benzonatate (TESSALON) 200 MG capsule Take 1 capsule (200 mg total) by mouth 2 (two) times daily as needed.   Biotin 2.5 MG TABS Take 2.5 mg by mouth daily.   diltiazem (CARDIZEM CD) 120 MG 24 hr capsule TAKE 1 CAPSULE BY MOUTH DAILY   ferrous gluconate (FERGON) 324 MG tablet Take 324 mg by mouth daily with breakfast.   flecainide (TAMBOCOR) 50 MG tablet Take 1.5 tablets (75 mg total) by mouth daily.   fluticasone-salmeterol (ADVAIR HFA) 115-21 MCG/ACT inhaler Inhale 2 puffs into the lungs 2 (two) times daily. (Patient taking differently: Inhale 2 puffs into the lungs  daily as needed (Breathing).)   furosemide (LASIX) 20 MG tablet TAKE 1 TABLET BY MOUTH DAILY AS NEEDED FOR SWELLING OR WEIGHT GAIN   montelukast (SINGULAIR) 10 MG tablet TAKE 1 TABLET(10 MG) BY MOUTH AT BEDTIME   Multiple Vitamin (MULTIVITAMIN) capsule Take 1 capsule by mouth daily. One a day   rivaroxaban (XARELTO) 20 MG TABS tablet Take 1 tablet (20 mg total) by mouth daily with supper.   rosuvastatin (CRESTOR) 10 MG tablet TAKE 1 TABLET(10 MG) BY MOUTH DAILY     Allergies:   Lisinopril   Social and Family History: Reviewed in Epic  ROS:   Please see the history of present illness.    All other systems reviewed and are negative.  EKGs/Labs/Other Studies  Reviewed Today:    Cardiac Studies & Procedures       ECHOCARDIOGRAM  ECHOCARDIOGRAM COMPLETE 01/22/2023  Narrative ECHOCARDIOGRAM REPORT    Patient Name:   Stacy Chapman Date of Exam: 01/22/2023 Medical Rec #:  [id number]            Height:       67.0 in Accession #:    [id number]           Weight:       239.0 lb Date of Birth:  1956/02/26            BSA:          2.181 m Patient Age:    66 years             BP:           142/80 mmHg Patient Gender: F                    HR:           87 bpm. Exam Location:  Outpatient  Procedure: 2D Echo, Color Doppler and Cardiac Doppler  Indications:    Atrial Fibrillation I48.19  History:        Patient has prior history of Echocardiogram examinations. Stroke; Risk Factors:Diabetes, Hypertension and Morbid Obesity.  Sonographer:    L. Thornton-Maynard Referring Phys: [id number] CLINT R FENTON  IMPRESSIONS   1. Left ventricular ejection fraction, by estimation, is 50 to 55%. The left ventricle has low normal function. The left ventricle has no regional wall motion abnormalities. Left ventricular diastolic parameters are indeterminate. 2. Right ventricular systolic function is normal. The right ventricular size is normal. There is moderately elevated pulmonary artery systolic pressure. 3. Left atrial size was mildly dilated. 4. Mitral regurgitation worst in the [facility] view. The mitral valve is grossly normal. Moderate to severe mitral valve regurgitation. 5. Tricuspid valve regurgitation is mild to moderate. 6. The aortic valve was not well visualized. Aortic valve regurgitation is mild.  Comparison(s): Prior images reviewed side by side. Mitrla regurgitation has increased from prior.  FINDINGS Left Ventricle: Left ventricular ejection fraction, by estimation, is 50 to 55%. The left ventricle has low normal function. The left ventricle has no regional wall motion abnormalities. The left ventricular internal cavity size was normal in  size. There is no left ventricular hypertrophy. Left ventricular diastolic parameters are indeterminate.  Right Ventricle: The right ventricular size is normal. Right vetricular wall thickness was not well visualized. Right ventricular systolic function is normal. There is moderately elevated pulmonary artery systolic pressure. The tricuspid regurgitant velocity is 3.34 m/s, and with an assumed right atrial pressure of 3 mmHg, the estimated right ventricular systolic pressure is 47.6 mmHg.  Left Atrium: Left atrial size was mildly dilated.  Right Atrium: Right atrial size was normal in size.  Pericardium: There is no evidence of pericardial effusion.  Mitral Valve: Mitral regurgitation worst in the [facility] view. The mitral valve is grossly normal. Moderate to severe mitral valve regurgitation. MV peak gradient, 8.2 mmHg. The mean mitral valve gradient is 3.0 mmHg.  Tricuspid Valve: The tricuspid valve is normal in structure. Tricuspid valve regurgitation is mild to moderate.  Aortic Valve: The aortic valve was not well visualized. Aortic valve regurgitation is mild. Aortic regurgitation PHT measures 379 msec. Aortic valve mean gradient measures 4.0 mmHg. Aortic valve peak gradient measures 7.1 mmHg. Aortic valve area, by VTI measures 1.54 cm.  Pulmonic Valve: The pulmonic valve was not well visualized. Pulmonic valve regurgitation is not visualized.  Aorta: The aortic root and ascending aorta are structurally normal, with no evidence of dilitation.  IAS/Shunts: No atrial level shunt detected by color flow Doppler.   LEFT VENTRICLE PLAX 2D LVIDd:         5.50 cm     Diastology LVIDs:         3.70 cm     LV e' medial:    11.90 cm/s LV PW:         1.10 cm     LV E/e' medial:  9.7 LV IVS:        1.00 cm     LV e' lateral:   12.70 cm/s LVOT diam:     1.80 cm     LV E/e' lateral: 9.1 LV SV:         41 LV SV Index:   19 LVOT Area:     2.54 cm  LV Volumes (MOD) LV vol d, MOD A2C: 58.6  ml LV vol d, MOD A4C: 32.8 ml LV vol s, MOD A2C: 21.8 ml LV vol s, MOD A4C: 14.8 ml LV SV MOD A2C:     36.8 ml LV SV MOD A4C:     32.8 ml LV SV MOD BP:      28.8 ml  RIGHT VENTRICLE RV Basal diam:  3.00 cm RV S prime:     10.20 cm/s TAPSE (M-mode): 1.6 cm  LEFT ATRIUM             Index        RIGHT ATRIUM           Index LA diam:        4.20 cm 1.93 cm/m   RA Area:     12.40 cm LA Vol (A2C):   62.9 ml 28.84 ml/m  RA Volume:   27.00 ml  12.38 ml/m LA Vol (A4C):   72.4 ml 33.19 ml/m LA Biplane Vol: 68.7 ml 31.49 ml/m AORTIC VALVE                    PULMONIC VALVE AV Area (Vmax):    1.64 cm     PV Vmax:       0.74 m/s AV Area (Vmean):   1.53 cm     PV Peak grad:  2.2 mmHg AV Area (VTI):     1.54 cm AV Vmax:           133.33 cm/s AV Vmean:          94.267 cm/s AV VTI:            0.268 m AV Peak Grad:      7.1 mmHg AV Mean Grad:  4.0 mmHg LVOT Vmax:         85.70 cm/s LVOT Vmean:        56.600 cm/s LVOT VTI:          0.162 m LVOT/AV VTI ratio: 0.60 AI PHT:            379 msec  AORTA Ao Root diam: 2.60 cm Ao Asc diam:  2.70 cm  MITRAL VALVE                  TRICUSPID VALVE MV Area (PHT): 4.85 cm       TR Peak grad:   44.6 mmHg MV Area VTI:   1.52 cm       TR Vmax:        334.00 cm/s MV Peak grad:  8.2 mmHg MV Mean grad:  3.0 mmHg       SHUNTS MV Vmax:       1.43 m/s       Systemic VTI:  0.16 m MV Vmean:      84.7 cm/s      Systemic Diam: 1.80 cm MV Decel Time: 157 msec MR Peak grad:    130.4 mmHg MR Mean grad:    87.0 mmHg MR Vmax:         571.00 cm/s MR Vmean:        437.0 cm/s MR PISA:         0.57 cm MR PISA Eff ROA: 3 mm MR PISA Radius:  0.30 cm MV E velocity: 115.50 cm/s  Riley Lam MD Electronically signed by Riley Lam MD Signature Date/Time: 01/22/2023/9:40:39 AM    Final    MONITORS  LONG TERM MONITOR (3-14 DAYS) 11/06/2022  Narrative   Predominatly sinus rhythm   Frequent episodes of paroxysmal atrial  fibrillation   She had 360 pauses - the longest pause ws 8.5 seconds.   these pauses were predominately nocturnal   Patient triggers were associated with atrial fib and junctional rhythm   The patient has been referred to EP   Patch Wear Time:  13 days and 20 hours (2024-01-04T13:58:20-0500 to 2024-01-18T10:29:18-0500)  Patient had a min HR of 38 bpm, max HR of 174 bpm, and avg HR of 75 bpm. Predominant underlying rhythm was Sinus Rhythm. 1 run of Ventricular Tachycardia occurred lasting 5 beats with a max rate of 122 bpm (avg 104 bpm). Atrial Fibrillation occurred (19% burden), ranging from 38-174 bpm (avg of 88 bpm), the longest lasting 8 hours 19 mins with an avg rate of 83 bpm. 360 Pauses occurred, the longest lasting 8.5 secs (7 bpm). Second Degree AV Block-Mobitz I (Wenckebach) was present. Junctional Rhythm was present. Atrial Fibrillation and Junctional Rhythm were detected within +/- 45 seconds of symptomatic patient event(s). Isolated SVEs were rare (<1.0%, 14193), SVE Couplets were rare (<1.0%, 200), and SVE Triplets were rare (<1.0%, 95). Isolated VEs were occasional (1.0%, 14741), VE Couplets were rare (<1.0%, 67), and no VE Triplets were present. Ventricular Trigeminy was present. Difficulty discerning atrial activity making definitive diagnosis difficult to ascertain. MD notification criteria for Pauses met - report posted prior to notification per account request (NB).           EKG:  Last EKG results: today - AF   Recent Labs: 10/09/2022: B Natriuretic Peptide 512.3 10/11/2022: Magnesium 1.7 10/16/2022: ALT 22 03/18/2023: BUN 21; Creatinine, Ser 1.05; Hemoglobin 11.6; Platelets 268; Potassium 5.2; Sodium 138     Physical Exam:    VS:  BP Marland Kitchen)  141/76   Pulse 73   Temp 98.2 F (36.8 C) (Temporal)   Resp 16   Ht 5\' 7"  (1.702 m)   Wt 104.3 kg   SpO2 99%   BMI 36.02 kg/m     Wt Readings from Last 3 Encounters:  04/08/23 104.3 kg  03/25/23 107.4 kg  02/16/23 105.7 kg      GEN: Well nourished, well developed in no acute distress CARDIAC: iRRR, no murmurs, rubs, gallops RESPIRATORY:  Normal work of breathing MUSCULOSKELETAL: no edema    ASSESSMENT & PLAN:    Atrial fibrillation: mild symptoms in fib, but having dizziness with flecainide, and tachy-brady is an issues, particularly with post-conversion pauses.   She presents today for AF ablation. All questions answered.   Tachy-brady syndrome: pauses of up to 8.5 seconds at night. No syncope other than one episodes that occurred when the patient was dehydrated and suffering from influenza. Ideally, we would manage with rhythm control. Will plan to stop flecainide with ablation.        Medication Adjustments/Labs and Tests Ordered: Current medicines are reviewed at length with the patient today.  Concerns regarding medicines are outlined above.  Orders Placed This Encounter  Procedures   Informed Consent Details: Physician/Practitioner Attestation; Transcribe to consent form and obtain patient signature   Initiate Pre-op Protocol   Void on call to EP Lab   Confirm CBC and BMP (or CMP) results within 7 days for inpatient and 30 days for outpatient:   Clip right and left femoral area PM before surgery   Clip right internal jugular area PM before surgery   Pre-admission testing diagnosis   EP STUDY   Insert peripheral IV   Meds ordered this encounter  Medications   0.9 %  sodium chloride infusion   acetaminophen (TYLENOL) tablet 1,000 mg     Signed, Maurice Small, MD  04/08/2023 1:54 PM    [facility]

## 2023-04-08 NOTE — Anesthesia Procedure Notes (Signed)
Procedure Name: Intubation Date/Time: 04/08/2023 3:02 PM  Performed by: Samara Deist, CRNAPre-anesthesia Checklist: Patient identified, Emergency Drugs available, Suction available, Patient being monitored and Timeout performed Patient Re-evaluated:Patient Re-evaluated prior to induction Oxygen Delivery Method: Circle system utilized Preoxygenation: Pre-oxygenation with 100% oxygen Induction Type: IV induction Ventilation: Mask ventilation without difficulty Laryngoscope Size: Mac and 4 Grade View: Grade I Tube type: Oral Tube size: 7.0 mm Number of attempts: 1 Airway Equipment and Method: Stylet Placement Confirmation: ETT inserted through vocal cords under direct vision, positive ETCO2 and breath sounds checked- equal and bilateral Secured at: 21 cm Tube secured with: Tape Dental Injury: Teeth and Oropharynx as per pre-operative assessment

## 2023-04-08 NOTE — Discharge Instructions (Signed)

## 2023-04-08 NOTE — Transfer of Care (Signed)
Immediate Anesthesia Transfer of Care Note  Patient: Stacy Chapman  Procedure(s) Performed: ATRIAL FIBRILLATION ABLATION  Patient Location: PACU and Cath Lab  Anesthesia Type:General  Level of Consciousness: awake, alert , and oriented  Airway & Oxygen Therapy: Patient Spontanous Breathing and Patient connected to nasal cannula oxygen  Post-op Assessment: Report given to RN and Post -op Vital signs reviewed and stable  Post vital signs: Reviewed and stable  Last Vitals:  Vitals Value Taken Time  BP    Temp 36.8 C 04/08/23 1654  Pulse 77 04/08/23 1656  Resp 14 04/08/23 1656  SpO2 100 % 04/08/23 1656  Vitals shown include unvalidated device data.  Last Pain:  Vitals:   04/08/23 1654  TempSrc: Temporal  PainSc: 0-No pain         Complications: No notable events documented.

## 2023-04-08 NOTE — Anesthesia Postprocedure Evaluation (Signed)
Anesthesia Post Note  Patient: Stacy Chapman  Procedure(s) Performed: ATRIAL FIBRILLATION ABLATION     Patient location during evaluation: Phase II Anesthesia Type: General Level of consciousness: awake and alert, patient cooperative and oriented Pain management: pain level controlled Vital Signs Assessment: post-procedure vital signs reviewed and stable Respiratory status: nonlabored ventilation, spontaneous breathing and respiratory function stable Cardiovascular status: blood pressure returned to baseline and stable Postop Assessment: no apparent nausea or vomiting Anesthetic complications: no   No notable events documented.  Last Vitals:  Vitals:   04/08/23 1725 04/08/23 1820  BP: 130/64 (!) 146/75  Pulse: 75 68  Resp: 16 20  Temp: 36.9 C   SpO2: 100% 97%    Last Pain:  Vitals:   04/08/23 1725  TempSrc: Temporal  PainSc:                  Keisy Strickler,E. Trevell Pariseau

## 2023-04-08 NOTE — Anesthesia Preprocedure Evaluation (Addendum)
Anesthesia Evaluation  Patient identified by MRN, date of birth, ID band Patient awake    Reviewed: Allergy & Precautions, NPO status , Patient's Chart, lab work & pertinent test results  History of Anesthesia Complications Negative for: history of anesthetic complications  Airway Mallampati: II  TM Distance: >3 FB Neck ROM: Full    Dental  (+) Missing, Dental Advisory Given   Pulmonary asthma , COPD,  COPD inhaler   breath sounds clear to auscultation       Cardiovascular hypertension, Pt. on medications + dysrhythmias Atrial Fibrillation  Rhythm:Irregular Rate:Normal  01/2023 ECHO: EF 50-55%, low normal LVF, normal RVF, mod-severe MR, mild-mod TR, mild AI   Neuro/Psych TIA   GI/Hepatic negative GI ROS, Neg liver ROS,,,  Endo/Other  diabetes (diet controlled)  BMI 36  Renal/GU negative Renal ROS     Musculoskeletal  (+) Arthritis ,    Abdominal   Peds  Hematology Xarelto   Anesthesia Other Findings   Reproductive/Obstetrics                             Anesthesia Physical Anesthesia Plan  ASA: 3  Anesthesia Plan: General   Post-op Pain Management: Tylenol PO (pre-op)*   Induction: Intravenous  PONV Risk Score and Plan: 3 and Ondansetron, Dexamethasone and Treatment may vary due to age or medical condition  Airway Management Planned: Oral ETT  Additional Equipment: None  Intra-op Plan:   Post-operative Plan: Extubation in OR  Informed Consent: I have reviewed the patients History and Physical, chart, labs and discussed the procedure including the risks, benefits and alternatives for the proposed anesthesia with the patient or authorized representative who has indicated his/her understanding and acceptance.     Dental advisory given  Plan Discussed with: CRNA and Surgeon  Anesthesia Plan Comments:        Anesthesia Quick Evaluation

## 2023-04-09 ENCOUNTER — Encounter (HOSPITAL_COMMUNITY): Payer: Self-pay | Admitting: Cardiovascular Disease

## 2023-04-09 ENCOUNTER — Telehealth: Payer: Self-pay | Admitting: Internal Medicine

## 2023-04-09 LAB — POCT ACTIVATED CLOTTING TIME
Activated Clotting Time: 464 seconds
Activated Clotting Time: 477 seconds

## 2023-04-09 NOTE — Telephone Encounter (Signed)
Spoke with patient, letter in [place].

## 2023-04-09 NOTE — Telephone Encounter (Signed)
Patient states that she needs a dr not for yesterday appt for work. Please advise

## 2023-04-13 ENCOUNTER — Telehealth: Payer: Self-pay | Admitting: Internal Medicine

## 2023-04-13 ENCOUNTER — Emergency Department (HOSPITAL_BASED_OUTPATIENT_CLINIC_OR_DEPARTMENT_OTHER): Payer: BC Managed Care – PPO

## 2023-04-13 ENCOUNTER — Emergency Department (HOSPITAL_COMMUNITY)
Admission: EM | Admit: 2023-04-13 | Discharge: 2023-04-13 | Disposition: A | Discharge status: Home / Self Care | Payer: BC Managed Care – PPO | Attending: Emergency Medicine | Admitting: Emergency Medicine

## 2023-04-13 ENCOUNTER — Encounter (HOSPITAL_COMMUNITY): Payer: Self-pay

## 2023-04-13 ENCOUNTER — Other Ambulatory Visit: Payer: Self-pay

## 2023-04-13 DIAGNOSIS — I10 Essential (primary) hypertension: Secondary | ICD-10-CM | POA: Insufficient documentation

## 2023-04-13 DIAGNOSIS — I4891 Unspecified atrial fibrillation: Secondary | ICD-10-CM | POA: Diagnosis not present

## 2023-04-13 DIAGNOSIS — Z7951 Long term (current) use of inhaled steroids: Secondary | ICD-10-CM | POA: Insufficient documentation

## 2023-04-13 DIAGNOSIS — M7989 Other specified soft tissue disorders: Secondary | ICD-10-CM

## 2023-04-13 DIAGNOSIS — J45909 Unspecified asthma, uncomplicated: Secondary | ICD-10-CM | POA: Diagnosis not present

## 2023-04-13 DIAGNOSIS — E119 Type 2 diabetes mellitus without complications: Secondary | ICD-10-CM | POA: Diagnosis not present

## 2023-04-13 DIAGNOSIS — Z8673 Personal history of transient ischemic attack (TIA), and cerebral infarction without residual deficits: Secondary | ICD-10-CM | POA: Insufficient documentation

## 2023-04-13 DIAGNOSIS — Z7901 Long term (current) use of anticoagulants: Secondary | ICD-10-CM | POA: Insufficient documentation

## 2023-04-13 DIAGNOSIS — R200 Anesthesia of skin: Secondary | ICD-10-CM | POA: Diagnosis not present

## 2023-04-13 DIAGNOSIS — Z79899 Other long term (current) drug therapy: Secondary | ICD-10-CM | POA: Insufficient documentation

## 2023-04-13 DIAGNOSIS — R202 Paresthesia of skin: Secondary | ICD-10-CM | POA: Insufficient documentation

## 2023-04-13 LAB — CBC
HCT: 35.3 % — ABNORMAL LOW (ref 36.0–46.0)
Hemoglobin: 11 g/dL — ABNORMAL LOW (ref 12.0–15.0)
MCH: 28 pg (ref 26.0–34.0)
MCHC: 31.2 g/dL (ref 30.0–36.0)
MCV: 89.8 fL (ref 80.0–100.0)
Platelets: 236 10*3/uL (ref 150–400)
RBC: 3.93 MIL/uL (ref 3.87–5.11)
RDW: 13.5 % (ref 11.5–15.5)
WBC: 5.4 10*3/uL (ref 4.0–10.5)
nRBC: 0 % (ref 0.0–0.2)

## 2023-04-13 NOTE — Telephone Encounter (Signed)
Patient stated she has been having some numbness in her left leg below her knee.  Patient wants to know next steps.

## 2023-04-13 NOTE — Telephone Encounter (Signed)
Spoke to the pt, on 6/26 had A-fib ablation since 6/28 pt c/o tingling,numbness, mild swelling and sensitive to touch around the left knee cap.Denied any other symptoms.  Pt did not contact PCP. Explained ED precautions, pt replied she is going to [facility] ED. Will forward to MD and nurse.

## 2023-04-13 NOTE — Telephone Encounter (Signed)
Patient stated her company has not received the letter showing her return to work as July 4th.  Patient wants letter re-faxed to [facility] office.

## 2023-04-13 NOTE — ED Triage Notes (Signed)
Pt came in via POV d/t Lt leg numbness since she had a cardiac ablation 5 days ago. States when she touches her leg it tingles, denies any CP or SOB. She was advised by her cardiologist to come in for eval.

## 2023-04-13 NOTE — ED Notes (Signed)
Discharge instructions discussed with pt. Verbalized understanding. VSS. No questions or concerns regarding discharge  

## 2023-04-13 NOTE — Consult Note (Signed)
ELECTROPHYSIOLOGY CONSULT NOTE    Patient ID: Stacy Chapman MRN: [id number], DOB/AGE: June 24, 1956 67 y.o.  Admit date: 04/13/2023 Date of Consult: 04/13/2023  Primary Physician: Anne Ng, NP Primary Cardiologist: Kristeen Miss, MD  Electrophysiologist: Dr. Nelly Laurence   Referring Provider: Dr. Rubin Payor   Patient Profile: Stacy Chapman is a 67 y.o. female with a history of TIA, tachy-brady syndrome, atrial fibrillation on Xarelto + Flecainide s/p PVI Ablation on 04/08/23 who is being seen today for the evaluation of left lower extremity numbness at the request of Dr. Rubin Payor.  Plan post-procedure was to stop flecainide after ablation. She reports after her ablation, she began having numbness in her left leg / knee.  She noted some swelling in her ankles.  She states she has not been up / very mobile since the procedure. She noted pain that starts in her thigh and runs down to the top of her foot. No loss of bowel / bladder. Reports no issues from a cardiac standpoint post ablation.   She denies chest pain, palpitations, dyspnea, PND, orthopnea, nausea, vomiting, dizziness, syncope, weight gain, or early satiety.   Labs       PLT  236 (07/01 1327) HGB  11.0* (07/01 1327) WBC 5.4 (07/01 1327)  .    Past Medical History:  Diagnosis Date   Anemia    Arthritis    Asthma    Diabetes (HCC)    Diarrhea of presumed infectious origin 10/09/2022   Essential hypertension    Morbid obesity (HCC)    Persistent atrial fibrillation (HCC)    Stroke (HCC) 2018   TIA     Surgical History:  Past Surgical History:  Procedure Laterality Date   ATRIAL FIBRILLATION ABLATION N/A 04/08/2023   Procedure: ATRIAL FIBRILLATION ABLATION;  Surgeon: Maurice Small, MD;  Location: [facility];  Service: Cardiovascular;  Laterality: N/A;   KNEE ARTHROSCOPY     TONSILLECTOMY     TUBAL LIGATION  1991     (Not in a hospital admission)   Inpatient Medications:    Allergies:  Allergies  Allergen Reactions   Lisinopril Cough    Family History  Problem Relation Age of Onset   Stroke Father 57       death   Hypertension Father    Dementia Sister    Diabetes Sister    Eczema Sister    Dementia Sister    Cancer Paternal Uncle        unknown   Hypertension Brother    Stroke Brother 21       death   Cancer Brother        lung   Colon cancer Neg Hx    Colon polyps Neg Hx    Stomach cancer Neg Hx    Rectal cancer Neg Hx    Esophageal cancer Neg Hx    Breast cancer Neg Hx      Physical Exam: Vitals:   04/13/23 1121 04/13/23 1417  BP: (!) 153/63   Pulse: 69   Resp: 16   Temp: 98 F (36.7 C)   TempSrc: Oral   SpO2: 99%   Weight:  104.3 kg  Height:  5\' 7"  (1.702 m)    GEN- well developed adult female lying in bed, NAD, A&O x 3, normal affect HEENT: Normocephalic, atraumatic Lungs- CTAB, Normal effort.  Heart- Regular rate and rhythm, No M/G/R.  GI- Soft, NT, ND.  Extremities- No clubbing, cyanosis. +DP pulses / symmetrical. Trace pedal  edema bilaterally.    Radiology/Studies: EP STUDY  Result Date: 04/09/2023 SURGEON:  York Pellant, MD PREPROCEDURE DIAGNOSES: 1. Atrial fibrillation POSTPROCEDURE DIAGNOSES: 1. Atrial fibrillation PROCEDURES: 1. Pulmonary vein isolation (wide antral circumferential lesion set) 2. Esophageal temperature monitoring using the Circa temperature monitor 3. Three-dimensional electroanatomic mapping of atrial fibrillation 4. Intracardiac echocardiography 5. Transseptal puncture of an intact septum. 6. Comprehensive EP study INTRODUCTION:  Stacy Chapman is a 67 y.o. female with a history of persistent atrial fibrillation who now presents for ablation.  DESCRIPTION OF PROCEDURE:  Informed written consent was obtained, and the patient was brought to the electrophysiology lab in a fasting state.  General anesthesia was induced and a Circa esophageal temperature probe was positioned in the esophagus.  The patient was adequately sedated as outlined in the anesthesia report.  The patient's left and right groins were prepped and draped in the usual sterile fashion by the EP lab staff.  Using ultrasound guidance and modified Seldinger technique, three 8-French sheaths were positioned in the right femoral vein and one 9-French sheath was positioned in the left femoral vein. Intracardiac Echocardiography: An 8-French Bios[employer] Webster intracardiac echocardiography catheter was introduced through the left common femoral vein and advanced into the right atrium. Intracardiac echocardiography revealed no obvious LA thrombus and 4 distinct PVs.  Ablation catheter: One of the right femoral vein 8-Fr sheaths was exchanged for a vizigo sheath over a wire. The D/F curve QDOT ablation catheter was advanced to the RA and used to obtain an electroanatomic map of the coronary sinus. Transseptal Access: Heparin was initiated and used to maintain a therapeutic ACT of 300-350 seconds. The second right femoral 8-Fr sheath was exchanged for a stearable Baylis sheath over a Baylis pigtail wire and advanced to the SVC under ultrasound guidance. Transseptal access was achieved using ICE guidance with the [facility] system. The [facility] wire was advanced into the left pulmonary veins. The ablation catheter was then advanced into the left atrium alongside the [facility] wire through the single transseptal puncture. The Baylis sheath was advanced into the left atrium. Mapping and Ablation: An Matilde Bash was advanced to the LA and a 3-dimensional electroanatomic map was created. Fall-off points were used to mark the anterior ridge anterior to the left pulmonary veins. Ablation was then performed to isolate the pulmonary veins in a wide area circumferential ablation lesion set. QDOT+ was used on lesions along the posterior wall with 90 W of energy and QDOT was used with 35 to 50 W of energy in the other regions. Due to esophageal heating along the posterior  wall adjacent to the PV's, interrupted lesions were delivered to this area to allow cooling of the esophageal tissue between lesions. After ablation was completed in the LA, high dose dobutamine was infused and entrance and exit block confirmed in all 4 pulmonary veins. A full EP study was performed  while infusing dobutamine. At baseline: PR , QRS 84ms, QT , RR , AH 70ms, HV 48ms. I advanced the ablation catheter to the LV for ventricular pacing. VA wenckebach was 270. AV wenckebach was 260. There was no evidence of dual AV nodal physiology. Burst pacing was performed without induction of any arrhythmias. Heparin was discontinued and catheters withdrawn to the right atrium. ICE exam showed stable LV function and no change in the baseline pericardial effusion. Heparin was reversed with protamine. Catheters were removed from the body. Sheaths were removed from the body and hemostasis achieved with perclose devices for the 2 long sheaths, and  2 Mynx grip devices for the short sheaths.. EBL<50ml. There were no early apparent complications. CONCLUSIONS: 1. Successful PVI 2. Intracardiac echo reveals no effusion 4. No early apparent complications. York Pellant, MD Cardiac Electrophysiology   CT CARDIAC MORPH/PULM VEIN W/CM&W/O CA SCORE  Addendum Date: 04/05/2023   ADDENDUM REPORT: 04/05/2023 17:28 EXAM: OVER-READ INTERPRETATION  CT CHEST The following report is an over-read performed by radiologist Dr. Noe Gens [facility] on 04/05/2023. This over-read does not include interpretation of cardiac or coronary anatomy or pathology. The coronary CTA interpretation by the cardiologist is attached. COMPARISON:  02/12/2023 FINDINGS: Heart is normal size. Aorta normal caliber. Calcified mediastinal and right hilar lymph nodes. Subcarinal lymph node measures 2.3 cm, stable since prior study with scattered calcifications. No hilar adenopathy. Scarring in the lung bases. Previously seen upper  lobe nodules are not visualized on today's study. No effusions. No acute findings in the upper abdomen. Chest wall soft tissues are unremarkable. No acute bony abnormality. 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 Scarring in the lung bases. No acute extra cardiac abnormality. Electronically Signed   By: Charlett Nose M.D.   On: 04/05/2023 17:28   Result Date: 04/05/2023 CLINICAL DATA:  Atrial fibrillation scheduled for an ablation. EXAM: Cardiac CT/CTA TECHNIQUE: The patient was scanned on a Siemens Somatom scanner. FINDINGS: A 120 kV prospective scan was triggered in the descending thoracic aorta at 111 HU's. Gantry rotation speed was 280 msecs and collimation was .9 mm. No beta blockade and no NTG was given. The 3D data set was reconstructed in 5% intervals of the 0-90% of the R-R cycle. Diastolic phases were analyzed on a dedicated work station using MPR, MIP and VRT modes. The patient received OMNIPAQUE IOHEXOL 350 MG/ML SOLN of contrast. There is normal variant pulmonary vein drainage into the left atrium (2 on the right and 2 on the left - which share a common ostium) with ostial measurements as follows: LIPV: 12.8 mm x 10.2 mm, area 0.988 cm2 LSPV: 20.9 mm x 15.0 mm, area 2.40 cm2 RIPV: 17.9 mm x 16.7 mm, area 1.91 cm2 RSPV: 17.5 mm x 12.7 mm, area 1.74 cm2 The left atrial appendage is large chicken wing / broccoli type with a single lobe and ostial size 21 x 19 mm and length 43 mm. There is no thrombus in the left atrial appendage, verified on PV delay images. The esophagus runs in the left atrial midline and is not in the proximity to any of the pulmonary veins. Aorta:  Normal caliber.  No dissection.  Aortic atherosclerosis. Aortic Valve: Not well-visualized. Coronary Arteries: Normal coronary origin. Right dominance. The study was performed without use of NTG  and insufficient for plaque evaluation. Calcium score: Coronary calcium score of 191. This was 90th percentile for age-, race-, and sex-matched controls. Markedly dilated main pulmonary artery to 40 mm at the bifurcation, suggestive of pulmonary hypertension. IMPRESSION: 1. There is normal variant pulmonary vein drainage into the left atrium. The left pulmonary veins share a common ostium. 2. The left atrial appendage is large - mixed chicken wing/broccoli type with two lobes and ostial size 21 x 19 mm and length 43 mm. There is no thrombus in the left atrial appendage. 3. The esophagus runs in the left atrial midline and is not in the proximity to any of the pulmonary veins. 4. Calcium score: Coronary calcium score of 191. This was 90th  percentile for age-, race-, and sex-matched controls. 5. Aortic atherosclerosis. 6. Markedly dilated main pulmonary artery to 40 mm at the bifurcation, suggestive of pulmonary hypertension. Zoila Shutter, MD Electronically Signed: By: Chrystie Nose M.D. On: 04/01/2023 11:10     Assessment/Plan:  Left Knee Pain / Numbness  Suspect secondary to lying flat and immobility post procedure. Less likely arterial injury.   -assess LE venous duplex to r/o pseudoaneurysm & DVT -assess CBC  -encourage ambulation  -pt seen by Dr. Lalla Brothers, if above negative, can discharge from ER with post procedure precautions / instructions  S/p PVI Ablation for AF -follow up with Dr. Nelly Laurence as previously arranged    [facility] will sign off.   Follow up as an outpatient:  As previously arranged post ablation.   For questions or updates, please contact [facility] Please consult [email or web address] for contact info under Cardiology/STEMI.  Signed, Canary Brim, MSN, APRN, NP-C, AGACNP-BC [facility] - Electrophysiology  04/13/2023, 3:06 PM

## 2023-04-13 NOTE — Telephone Encounter (Signed)
Pt aware of Dr Morrie Sheldon response and pt is in ED for evaluation of knee pain ./cy

## 2023-04-13 NOTE — Telephone Encounter (Signed)
Received an additional disability paper from same insurance company, [employer].  Form in [employer].

## 2023-04-13 NOTE — Telephone Encounter (Signed)
Pt aware letter re faxed   Fax number [phone]

## 2023-04-13 NOTE — ED Provider Notes (Signed)
[place] EMERGENCY DEPARTMENT AT [facility] Provider Note   CSN: [id number] Arrival date & time: 04/13/23  1049     History  Chief Complaint  Patient presents with   Lt Leg Numbness    Stacy Chapman is a 67 y.o. female.  HPI Patient had A-fib ablation on Wednesday with today being Monday.  States has had some tingling in her leg since.  Goes down the lateral side of the leg.  States also feels that there may be is some swelling in her left lower leg.  No trauma.  States is worse with her leg bent.  No chest pain or difficulty breathing.  No fevers.  Is on anticoagulation.   Past Medical History:  Diagnosis Date   Anemia    Arthritis    Asthma    Diabetes (HCC)    Diarrhea of presumed infectious origin 10/09/2022   Essential hypertension    Morbid obesity (HCC)    Persistent atrial fibrillation (HCC)    Stroke (HCC) 2018   TIA    Home Medications Prior to Admission medications   Medication Sig Start Date End Date Taking? Authorizing Provider  acetaminophen (TYLENOL) 500 MG tablet Take 1,000 mg by mouth every 8 (eight) hours as needed for moderate pain.    [provider]  albuterol (VENTOLIN HFA) 108 (90 Base) MCG/ACT inhaler INHALE 1 TO 2 PUFFS INTO THE LUNGS EVERY 6 HOURS AS NEEDED FOR WHEEZING OR SHORTNESS OF BREATH 01/08/23   Nche, Bonna Gains, NP  ascorbic acid (VITAMIN C) 500 MG tablet Take 500 mg by mouth daily.    [provider]  azelastine (ASTELIN) 0.1 % nasal spray Place 1 spray into both nostrils 2 (two) times daily. Use in each nostril as directed 08/11/22   Nche, Bonna Gains, NP  benzonatate (TESSALON) 200 MG capsule Take 1 capsule (200 mg total) by mouth 2 (two) times daily as needed. 01/15/23   Nche, Bonna Gains, NP  Biotin 2.5 MG TABS Take 2.5 mg by mouth daily.    [provider]  colchicine 0.6 MG tablet Take 1 tablet (0.6 mg total) by mouth 2 (two) times daily for 5 days. 04/08/23 04/13/23  Sheilah Pigeon,  PA-C  diltiazem (CARDIZEM CD) 120 MG 24 hr capsule TAKE 1 CAPSULE BY MOUTH DAILY 10/08/22   Fenton, Clint R, PA  ferrous gluconate (FERGON) 324 MG tablet Take 324 mg by mouth daily with breakfast.    [provider]  flecainide (TAMBOCOR) 50 MG tablet Take 1.5 tablets (75 mg total) by mouth daily. 02/16/23   Orbie Pyo, MD  fluticasone-salmeterol (ADVAIR HFA) 865-78 MCG/ACT inhaler Inhale 2 puffs into the lungs 2 (two) times daily. Patient taking differently: Inhale 2 puffs into the lungs daily as needed (Breathing). 01/29/23   Nche, Bonna Gains, NP  furosemide (LASIX) 20 MG tablet TAKE 1 TABLET BY MOUTH DAILY AS NEEDED FOR SWELLING OR WEIGHT GAIN 10/14/22   Fenton, Clint R, PA  montelukast (SINGULAIR) 10 MG tablet TAKE 1 TABLET(10 MG) BY MOUTH AT BEDTIME 01/12/23   Nche, Bonna Gains, NP  Multiple Vitamin (MULTIVITAMIN) capsule Take 1 capsule by mouth daily. One a day    [provider]  pantoprazole (PROTONIX) 40 MG tablet Take 1 tablet (40 mg total) by mouth daily. 04/08/23 05/08/23  Sheilah Pigeon, PA-C  rivaroxaban (XARELTO) 20 MG TABS tablet Take 1 tablet (20 mg total) by mouth daily with supper. 02/16/23   Orbie Pyo, MD  rosuvastatin (CRESTOR) 10 MG tablet TAKE 1 TABLET(10 MG) BY MOUTH DAILY 01/22/23   Nche, Bonna Gains, NP      Allergies    Lisinopril    Review of Systems   Review of Systems  Physical Exam Updated Vital Signs BP (!) 153/63 (BP Location: Left Arm)   Pulse 69   Temp 98 F (36.7 C) (Oral)   Resp 16   Ht 5\' 7"  (1.702 m)   Wt 104.3 kg   SpO2 99%   BMI 36.02 kg/m  Physical Exam Vitals and nursing note reviewed.  HENT:     Head: Normocephalic.  Musculoskeletal:     Comments: Mild edema left lower extremity.  Patient states it is worse than right, however appears relatively symmetric for me.  Strong dorsalis pedis pulse.  Sensation grossly intact.  States that the paresthesias are not there right now.  Mild tenderness at left groin.   Strength apparently intact and lower extremities.  Skin:    Capillary Refill: Capillary refill takes less than 2 seconds.  Neurological:     Mental Status: She is alert and oriented to person, place, and time.     ED Results / Procedures / Treatments   Labs (all labs ordered are listed, but only abnormal results are displayed) Labs Reviewed  CBC - Abnormal; Notable for the following components:      Result Value   Hemoglobin 11.0 (*)    HCT 35.3 (*)    All other components within normal limits    EKG None  Radiology VAS [place] LOWER EXTREMITY VENOUS (DVT)  Result Date: 04/13/2023  Lower Venous DVT Study Patient Name:  Stacy Chapman  Date of Exam:   04/13/2023 Medical Rec #: [id number]             Accession #:    [id number] Date of Birth: 1955-11-10             Patient Gender: F Patient Age:   9 years Exam Location:  [facility] Procedure:      VAS [place] LOWER EXTREMITY VENOUS (DVT) Referring Phys: Benjiman Core --------------------------------------------------------------------------------  Indications: Pain, and tingling around the knee area following status post ablation.  Comparison Study: No priors. Performing Technologist: Marilynne Halsted RDMS, RVT  Examination Guidelines: A complete evaluation includes B-mode imaging, spectral Doppler, color Doppler, and power Doppler as needed of all accessible portions of each vessel. Bilateral testing is considered an integral part of a complete examination. Limited examinations for reoccurring indications may be performed as noted. The reflux portion of the exam is performed with the patient in reverse Trendelenburg.  +-----+---------------+---------+-----------+----------+--------------+ RIGHTCompressibilityPhasicitySpontaneityPropertiesThrombus Aging +-----+---------------+---------+-----------+----------+--------------+ CFV  Full           Yes      Yes                                  +-----+---------------+---------+-----------+----------+--------------+ SFJ  Full                                                        +-----+---------------+---------+-----------+----------+--------------+   +---------+---------------+---------+-----------+----------+--------------+ LEFT     CompressibilityPhasicitySpontaneityPropertiesThrombus Aging +---------+---------------+---------+-----------+----------+--------------+ CFV      Full           Yes  Yes                                 +---------+---------------+---------+-----------+----------+--------------+ SFJ      Full                                                        +---------+---------------+---------+-----------+----------+--------------+ FV Prox  Full                                                        +---------+---------------+---------+-----------+----------+--------------+ FV Mid   Full           Yes      Yes                                 +---------+---------------+---------+-----------+----------+--------------+ FV DistalFull                                                        +---------+---------------+---------+-----------+----------+--------------+ PFV      Full                                                        +---------+---------------+---------+-----------+----------+--------------+ POP      Full           Yes      Yes                                 +---------+---------------+---------+-----------+----------+--------------+ PTV      Full                                                        +---------+---------------+---------+-----------+----------+--------------+ PERO     Full                                                        +---------+---------------+---------+-----------+----------+--------------+     Summary: RIGHT: - No evidence of common femoral vein obstruction.  LEFT: - There is no evidence of deep vein thrombosis in the  lower extremity.  - No cystic structure found in the popliteal fossa.  *See table(s) above for measurements and observations.    Preliminary     Procedures Procedures    Medications Ordered in ED Medications - No data to display  ED Course/ Medical Decision Making/ A&P  Medical Decision Making Amount and/or Complexity of Data Reviewed Labs: ordered.   Patient with left lower extremity paresthesias.  Tingling.  Differential diagnosis includes paresthesia, could have irritation of the nerve from the procedure.  Hematoma in the area considered.  DVT considered.  Arterial injury felt less likely.  Pseudoaneurysm felt less likely.  Does have strong dorsalis pedis pulse.  Since referred by cardiology cardiologist notified.  CBC done to evaluate for hemoglobin which appears grossly stable from prior.  Will get venous Doppler to evaluate.  Doppler negative.  No DVT or proximal vascular pathology seen.  Hemoglobin stable.  Follow-up with EP as an outpatient plan.  Has been seen by electrophysiology in the ER.        Final Clinical Impression(s) / ED Diagnoses Final diagnoses:  Paresthesia    Rx / DC Orders ED Discharge Orders     None         Benjiman Core, MD 04/13/23 [phone]

## 2023-04-15 ENCOUNTER — Telehealth: Payer: Self-pay

## 2023-04-15 ENCOUNTER — Ambulatory Visit (INDEPENDENT_AMBULATORY_CARE_PROVIDER_SITE_OTHER): Payer: BC Managed Care – PPO | Admitting: Internal Medicine

## 2023-04-15 ENCOUNTER — Encounter: Payer: Self-pay | Admitting: Internal Medicine

## 2023-04-15 VITALS — BP 144/60 | HR 68 | Temp 98.1°F | Ht 67.0 in | Wt 240.4 lb

## 2023-04-15 DIAGNOSIS — R519 Headache, unspecified: Secondary | ICD-10-CM

## 2023-04-15 DIAGNOSIS — R2689 Other abnormalities of gait and mobility: Secondary | ICD-10-CM | POA: Diagnosis not present

## 2023-04-15 DIAGNOSIS — I10 Essential (primary) hypertension: Secondary | ICD-10-CM | POA: Diagnosis not present

## 2023-04-15 DIAGNOSIS — R202 Paresthesia of skin: Secondary | ICD-10-CM

## 2023-04-15 NOTE — Progress Notes (Signed)
[facility] [address] [place] [address] Dept: [phone] Dept Fax: [phone]  Acute Care Office Visit  Subjective:   Stacy Chapman 1956/03/27 04/15/2023  Chief Complaint  Patient presents with   Hospitalization Follow-up    HPI: Discussed the use of AI scribe software for clinical note transcription with the patient, who gave verbal consent to proceed.  History of Present Illness   The patient, with a recent history of heart ablation for Afib 1 week ago, presents with a chief complaint of feeling off balance since the procedure. The patient reports that this feeling of imbalance has been persistent since the surgery, which was performed last week. She went to ER on 7/1 with complaint of paresthesia and swelling to left lower extremity. US doppler was negative for acute DVT, CBC was checked and was stable.  The patient also reports the onset of headaches today, which is a new symptom. The patient has been monitoring their blood pressure at home, which has shown fluctuations, with readings ranging from 130/70 to 168/84. The patient also reports a visual disturbance described as a moving rainbow in their vision, which occurred while urinating a couple days ago. No current vision changes. The patient has been taking their prescribed medications, including diltiazem and flecainide, regularly. She is on Xarelto for anticoagulation.     No recent falls or head injuries.   The following portions of the patient's history were reviewed and updated as appropriate: past medical history, past surgical history, family history, social history, allergies, medications, and problem list.   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 04/30/2022   Encounter for therapeutic drug monitoring 09/10/2021   Hyperlipidemia 01/11/2021   Hypercoagulable state due to persistent atrial fibrillation (HCC) 08/23/2019   Left ear impacted cerumen 11/25/2018   Conductive hearing loss in left ear 11/25/2018   Mild intermittent asthma with acute exacerbation 10/22/2018   Chronic pansinusitis 01/22/2017   Type 2 diabetes mellitus with other specified complication, without long-term current use of insulin (HCC) 01/22/2017   Abnormal head CT 01/22/2017   Atrial fibrillation with RVR (HCC) 01/22/2017   Past Medical History:  Diagnosis Date   Anemia    Arthritis    Asthma    Diabetes (HCC)    Diarrhea of presumed infectious origin 10/09/2022   Essential hypertension    Morbid obesity (HCC)    Persistent atrial fibrillation (HCC)    Stroke (HCC) 2018   TIA   Past Surgical History:  Procedure Laterality Date   ATRIAL FIBRILLATION ABLATION N/A 04/08/2023   Procedure: ATRIAL FIBRILLATION ABLATION;  Surgeon: Maurice Small, MD;  Location: [facility];  Service: Cardiovascular;  Laterality: N/A;   KNEE ARTHROSCOPY     TONSILLECTOMY     TUBAL LIGATION  1991   Family History  Problem Relation Age of Onset   Stroke Father 58       death   Hypertension Father    Dementia Sister    Diabetes Sister    Eczema Sister    Dementia Sister    Cancer Paternal Uncle        unknown   Hypertension Brother    Stroke Brother 22       death   Cancer  Brother        lung   Colon cancer Neg Hx    Colon polyps Neg Hx    Stomach cancer Neg Hx    Rectal cancer Neg Hx    Esophageal cancer Neg Hx    Breast cancer Neg Hx    Outpatient Medications Prior to Visit  Medication Sig Dispense Refill   acetaminophen (TYLENOL) 500 MG tablet Take 1,000 mg by mouth every 8 (eight) hours as needed for moderate pain.     albuterol (VENTOLIN HFA) 108 (90 Base) MCG/ACT inhaler INHALE 1 TO 2 PUFFS INTO THE LUNGS  EVERY 6 HOURS AS NEEDED FOR WHEEZING OR SHORTNESS OF BREATH 20.1 g 0   ascorbic acid (VITAMIN C) 500 MG tablet Take 500 mg by mouth daily.     azelastine (ASTELIN) 0.1 % nasal spray Place 1 spray into both nostrils 2 (two) times daily. Use in each nostril as directed 90 mL 0   benzonatate (TESSALON) 200 MG capsule Take 1 capsule (200 mg total) by mouth 2 (two) times daily as needed. 60 capsule 1   Biotin 2.5 MG TABS Take 2.5 mg by mouth daily.     diltiazem (CARDIZEM CD) 120 MG 24 hr capsule TAKE 1 CAPSULE BY MOUTH DAILY 90 capsule 2   ferrous gluconate (FERGON) 324 MG tablet Take 324 mg by mouth daily with breakfast.     flecainide (TAMBOCOR) 50 MG tablet Take 1.5 tablets (75 mg total) by mouth daily. 135 tablet 3   fluticasone-salmeterol (ADVAIR HFA) 115-21 MCG/ACT inhaler Inhale 2 puffs into the lungs 2 (two) times daily. (Patient taking differently: Inhale 2 puffs into the lungs daily as needed (Breathing).) 12 g 5   furosemide (LASIX) 20 MG tablet TAKE 1 TABLET BY MOUTH DAILY AS NEEDED FOR SWELLING OR WEIGHT GAIN 30 tablet 5   montelukast (SINGULAIR) 10 MG tablet TAKE 1 TABLET(10 MG) BY MOUTH AT BEDTIME 90 tablet 0   Multiple Vitamin (MULTIVITAMIN) capsule Take 1 capsule by mouth daily. One a day     pantoprazole (PROTONIX) 40 MG tablet Take 1 tablet (40 mg total) by mouth daily. 30 tablet 0   rivaroxaban (XARELTO) 20 MG TABS tablet Take 1 tablet (20 mg total) by mouth daily with supper. 90 tablet 3   rosuvastatin (CRESTOR) 10 MG tablet TAKE 1 TABLET(10 MG) BY MOUTH DAILY 90 tablet 1   colchicine 0.6 MG tablet Take 1 tablet (0.6 mg total) by mouth 2 (two) times daily for 5 days. 10 tablet 0   No facility-administered medications prior to visit.   Allergies  Allergen Reactions   Lisinopril Cough     ROS: A complete ROS was performed with pertinent positives/negatives noted in the HPI. The remainder of the ROS are negative.    Objective:   Today's Vitals   04/15/23 1559 04/15/23 1602   BP: (!) 146/68 (!) 144/60  Pulse: 68   Temp: 98.1 F (36.7 C)   TempSrc: Oral   SpO2: 99%   Weight: 240 lb 6.4 oz (109 kg)   Height: 5\' 7"  (1.702 m)       GENERAL: Well-appearing, in NAD. Well nourished.  SKIN: Pink, warm and dry. No rash, lesion, ulceration, or ecchymoses.  NECK: Trachea midline. Full ROM w/o pain or tenderness. No lymphadenopathy.  RESPIRATORY: Chest wall symmetrical. Respirations even and non-labored. Breath sounds clear to auscultation bilaterally.  CARDIAC: S1, S2 present, regular rate and rhythm. Peripheral pulses 2+ bilaterally.  EXTREMITIES: Without clubbing, cyanosis, or edema.  NEUROLOGIC: Cn 2-12 intact. PERRLA. 5/5 motor strength. Sensory intact. Gait instability, unable to do heel to toe walk.  PSYCH/MENTAL STATUS: Alert, oriented x 3. Cooperative, appropriate mood and affect.    No results found for any visits on 04/15/23.    Assessment & Plan:  Assessment and Plan   1. Paresthesia of lower extremity - CT HEAD WO CONTRAST ( ); Future  2. Balance problem - CT HEAD WO CONTRAST ( ); Future  3. Acute nonintractable headache, unspecified headache type - CT HEAD WO CONTRAST ( ); Future  4. HTN (hypertension), benign - CT HEAD WO CONTRAST ( ); Future     Orders Placed This Encounter  Procedures   CT HEAD WO CONTRAST ( )    Standing Status:   Future    Standing Expiration Date:   04/14/2024    Order Specific Question:   Preferred imaging location?    Answer:   [facility]    Lab Orders  No laboratory test(s) ordered today   No images are attached to the encounter or orders placed in the encounter.  No follow-ups on file.   Of note, portions of this note may have been created with voice recognition software Physicist, medical). While this note has been edited for accuracy, occasional wrong-word or 'sound-a-like' substitutions may have occurred due to the inherent limitations of voice recognition software.   Salvatore Decent,  FNP

## 2023-04-15 NOTE — Transitions of Care (Post Inpatient/ED Visit) (Signed)
04/15/2023  Name: Stacy Chapman MRN: [id number] DOB: Sep 01, 1956  Today's TOC FU Call Status: Today's TOC FU Call Status:: Successful TOC FU Call Competed TOC FU Call Complete Date: 04/15/23  Transition Care Management Follow-up Telephone Call Date of Discharge: 04/13/23 Discharge Facility[facility]) Type of Discharge: Emergency Department How have you been since you were released from the hospital?: Same Any questions or concerns?: No  Items Reviewed: Did you receive and understand the discharge instructions provided?: Yes Any new allergies since your discharge?: No Dietary orders reviewed?: NA Do you have support at home?: Yes People in Home: child(ren), adult  Medications Reviewed Today: Medications Reviewed Today     Reviewed by Lenoria Farrier, CPhT (Pharmacy Technician) on 04/02/23 at 1627  Med List Status: Complete   Medication Order Taking? Sig Documenting Provider Last Dose Status Informant  acetaminophen (TYLENOL) 500 MG tablet [id number] Yes Take 1,000 mg by mouth every 8 (eight) hours as needed for moderate pain. [provider]  Active Self  albuterol (VENTOLIN HFA) 108 (90 Base) MCG/ACT inhaler [id number] Yes INHALE 1 TO 2 PUFFS INTO THE LUNGS EVERY 6 HOURS AS NEEDED FOR WHEEZING OR SHORTNESS OF BREATH Nche, Bonna Gains, NP  Active Self  ascorbic acid (VITAMIN C) 500 MG tablet [id number] Yes Take 500 mg by mouth daily. [provider]  Active Self  azelastine (ASTELIN) 0.1 % nasal spray [id number] Yes Place 1 spray into both nostrils 2 (two) times daily. Use in each nostril as directed Nche, Bonna Gains, NP  Active Self  benzonatate (TESSALON) 200 MG capsule [id number] Yes Take 1 capsule (200 mg total) by mouth 2 (two) times daily as needed. Anne Ng, NP  Active Self  Biotin 2.5 MG TABS [id number] Yes Take 2.5 mg by mouth daily. [provider]  Active Self  diltiazem (CARDIZEM CD) 120 MG 24 hr capsule [id number] Yes  TAKE 1 CAPSULE BY MOUTH DAILY Fenton, Clint R, PA  Active Self  ferrous gluconate (FERGON) 324 MG tablet [id number] Yes Take 324 mg by mouth daily with breakfast. [provider]  Active Self  flecainide (TAMBOCOR) 50 MG tablet [id number] Yes Take 1.5 tablets (75 mg total) by mouth daily. Orbie Pyo, MD  Active Self  fluticasone-salmeterol (ADVAIR [facility]) 301-60 MCG/ACT inhaler [id number] Yes Inhale 2 puffs into the lungs 2 (two) times daily.  Patient taking differently: Inhale 2 puffs into the lungs daily as needed (Breathing).   Anne Ng, NP  Active Self  furosemide (LASIX) 20 MG tablet [id number] Yes TAKE 1 TABLET BY MOUTH DAILY AS NEEDED FOR SWELLING OR WEIGHT GAIN Fenton, Clint R, PA  Active Self  montelukast (SINGULAIR) 10 MG tablet [id number] Yes TAKE 1 TABLET(10 MG) BY MOUTH AT BEDTIME Nche, Bonna Gains, NP  Active Self  Multiple Vitamin (MULTIVITAMIN) capsule [id number] Yes Take 1 capsule by mouth daily. One a day [provider]  Active Self  rivaroxaban (XARELTO) 20 MG TABS tablet [id number] Yes Take 1 tablet (20 mg total) by mouth daily with supper. Orbie Pyo, MD  Active Self  rosuvastatin (CRESTOR) 10 MG tablet [id number] Yes TAKE 1 TABLET(10 MG) BY MOUTH DAILY Nche, Bonna Gains, NP  Active Self            Home Care and Equipment/Supplies: Were Home Health Services Ordered?: NA Any new equipment or medical supplies ordered?: NA  Functional Questionnaire: Do you need assistance with bathing/showering or dressing?: No Do you need assistance with meal preparation?: No Do you  need assistance with eating?: No Do you have difficulty maintaining continence: No Do you need assistance with getting out of bed/getting out of a chair/moving?: No Do you have difficulty managing or taking your medications?: No  Follow up appointments reviewed: PCP Follow-up appointment confirmed?: Yes Date of PCP follow-up appointment?: 04/15/23 Follow-up Provider:  A. [facility] Follow-up appointment confirmed?: Yes Date of Specialist follow-up appointment?: 04/22/23 Do you need transportation to your follow-up appointment?: No Do you understand care options if your condition(s) worsen?: Yes-patient verbalized understanding    SIGNATURE Arvil Persons, BSN, RN

## 2023-04-17 ENCOUNTER — Telehealth: Payer: Self-pay

## 2023-04-17 ENCOUNTER — Telehealth: Payer: Self-pay | Admitting: Internal Medicine

## 2023-04-17 ENCOUNTER — Ambulatory Visit (HOSPITAL_BASED_OUTPATIENT_CLINIC_OR_DEPARTMENT_OTHER)
Admission: RE | Admit: 2023-04-17 | Discharge: 2023-04-17 | Disposition: A | Discharge status: Home / Self Care | Payer: BC Managed Care – PPO | Source: Ambulatory Visit | Attending: Internal Medicine | Admitting: Internal Medicine

## 2023-04-17 ENCOUNTER — Ambulatory Visit (HOSPITAL_COMMUNITY): Payer: BC Managed Care – PPO

## 2023-04-17 DIAGNOSIS — R202 Paresthesia of skin: Secondary | ICD-10-CM | POA: Diagnosis not present

## 2023-04-17 DIAGNOSIS — I10 Essential (primary) hypertension: Secondary | ICD-10-CM | POA: Diagnosis not present

## 2023-04-17 DIAGNOSIS — R519 Headache, unspecified: Secondary | ICD-10-CM | POA: Insufficient documentation

## 2023-04-17 DIAGNOSIS — R2689 Other abnormalities of gait and mobility: Secondary | ICD-10-CM | POA: Diagnosis not present

## 2023-04-17 DIAGNOSIS — R9082 White matter disease, unspecified: Secondary | ICD-10-CM | POA: Diagnosis not present

## 2023-04-17 NOTE — Telephone Encounter (Signed)
Patient is calling again to get update on FMLA. Requesting return call.

## 2023-04-17 NOTE — Telephone Encounter (Signed)
Left patient a detailed message asking if she plans to go have CT performed and to call the office

## 2023-04-17 NOTE — Telephone Encounter (Signed)
Patient calling back because no one has called her back. She states that its very important for her job. Please advise

## 2023-04-17 NOTE — Telephone Encounter (Signed)
Pt is requesting a callback regarding FMLA needing verification of procedure being done and when she can return back to work. Per 6/27 encounter it was faxed on 7/1 to [phone] but after she spoke with them this morning, they told her they haven't received anything so they'd like it to be sent again.

## 2023-04-20 ENCOUNTER — Telehealth: Payer: Self-pay | Admitting: Nurse Practitioner

## 2023-04-20 DIAGNOSIS — Z0279 Encounter for issue of other medical certificate: Secondary | ICD-10-CM

## 2023-04-20 NOTE — Telephone Encounter (Signed)
Received an additional [employer] disability form regarding patient's ablasion.  Payment received and form with [employer].

## 2023-04-20 NOTE — Telephone Encounter (Signed)
Spoke to the pt, she will like an update pertaining to completing her FMLA forms. Pt stated the documents were faxed on 6/28, however, no documentation that we have received the FMLA forms. Gave pt our fax number, she will call her employer and have the forms faxed to our office. Advised pt once we have received the documents, please give our office 10 business days to complete documents. Pt voiced understanding.

## 2023-04-20 NOTE — Telephone Encounter (Signed)
Pt is a pt at [employer] . She is calling them to make an a pt

## 2023-04-20 NOTE — Telephone Encounter (Signed)
Patient following back up on FMLA paperwork being refaxed to her job. Please refax to [phone] and call patient to update.

## 2023-04-20 NOTE — Telephone Encounter (Signed)
Stacy Chapman [phone]  Pt received her results from her CT scan. She would like a call to discuss these results.

## 2023-04-21 ENCOUNTER — Ambulatory Visit (INDEPENDENT_AMBULATORY_CARE_PROVIDER_SITE_OTHER): Payer: BC Managed Care – PPO | Admitting: Internal Medicine

## 2023-04-21 ENCOUNTER — Encounter: Payer: Self-pay | Admitting: Internal Medicine

## 2023-04-21 VITALS — BP 124/70 | HR 76 | Temp 97.9°F | Ht 67.0 in | Wt 238.0 lb

## 2023-04-21 DIAGNOSIS — R2689 Other abnormalities of gait and mobility: Secondary | ICD-10-CM

## 2023-04-21 DIAGNOSIS — J324 Chronic pansinusitis: Secondary | ICD-10-CM

## 2023-04-21 NOTE — Progress Notes (Signed)
[facility] [address] [place] [address] Dept: [phone] Dept Fax: [phone]  Acute Care Office Visit  Subjective:   Stacy Chapman 12/30/1955 04/21/2023  No chief complaint on file.   HPI: Discussed the use of AI scribe software for clinical note transcription with the patient, who gave verbal consent to proceed.  History of Present Illness   The patient, with a history of atrial fibrillation and recent ablation, presents with chronic dizziness and nasal congestion. They describe the dizziness as a feeling of being off balance, particularly when walking. They deny vertigo or a spinning sensation, but do note that they feel more off balance when they stand up quickly. Patient did have CT head performed on 7/5, did not show any acute abnormality. The patient also reports chronic nasal congestion, particularly in the mornings, and a loss of smell. They have been using fluticasone nasal spray for the congestion which helps.      The following portions of the patient's history were reviewed and updated as appropriate: past medical history, past surgical history, family history, social history, allergies, medications, and problem list.   Patient Active Problem List   Diagnosis Date Noted   Elevated BP without diagnosis of hypertension 01/15/2023   Chronic cough 12/22/2022   Persistent atrial fibrillation (HCC) 11/03/2022   Pulmonary nodules 10/09/2022   Spinal stenosis of cervical region with radiculopathy 04/30/2022   Encounter for therapeutic drug monitoring 09/10/2021   Hyperlipidemia 01/11/2021   Hypercoagulable state due to persistent atrial fibrillation (HCC) 08/23/2019   Left ear impacted cerumen 11/25/2018   Conductive hearing loss in left ear 11/25/2018   Mild intermittent asthma with acute exacerbation 10/22/2018   Chronic pansinusitis 01/22/2017   Type 2 diabetes mellitus with other specified  complication, without long-term current use of insulin (HCC) 01/22/2017   Abnormal head CT 01/22/2017   Atrial fibrillation with RVR (HCC) 01/22/2017   Past Medical History:  Diagnosis Date   Anemia    Arthritis    Asthma    Diabetes (HCC)    Diarrhea of presumed infectious origin 10/09/2022   Essential hypertension    Morbid obesity (HCC)    Persistent atrial fibrillation (HCC)    Stroke (HCC) 2018   TIA   Past Surgical History:  Procedure Laterality Date   ATRIAL FIBRILLATION ABLATION N/A 04/08/2023   Procedure: ATRIAL FIBRILLATION ABLATION;  Surgeon: Maurice Small, MD;  Location: [facility];  Service: Cardiovascular;  Laterality: N/A;   KNEE ARTHROSCOPY     TONSILLECTOMY     TUBAL LIGATION  1991   Family History  Problem Relation Age of Onset   Stroke Father 79       death   Hypertension Father    Dementia Sister    Diabetes Sister    Eczema Sister    Dementia Sister    Cancer Paternal Uncle        unknown   Hypertension Brother    Stroke Brother 46       death   Cancer Brother        lung   Colon cancer Neg Hx    Colon polyps Neg Hx    Stomach cancer Neg Hx    Rectal cancer Neg Hx    Esophageal cancer Neg Hx    Breast cancer Neg Hx    Outpatient Medications Prior to Visit  Medication Sig Dispense Refill   acetaminophen (TYLENOL) 500 MG tablet Take 1,000 mg by  mouth every 8 (eight) hours as needed for moderate pain.     albuterol (VENTOLIN HFA) 108 (90 Base) MCG/ACT inhaler INHALE 1 TO 2 PUFFS INTO THE LUNGS EVERY 6 HOURS AS NEEDED FOR WHEEZING OR SHORTNESS OF BREATH 20.1 g 0   ascorbic acid (VITAMIN C) 500 MG tablet Take 500 mg by mouth daily.     azelastine (ASTELIN) 0.1 % nasal spray Place 1 spray into both nostrils 2 (two) times daily. Use in each nostril as directed 90 mL 0   benzonatate (TESSALON) 200 MG capsule Take 1 capsule (200 mg total) by mouth 2 (two) times daily as needed. 60 capsule 1   Biotin 2.5 MG TABS Take 2.5 mg by mouth daily.      diltiazem (CARDIZEM CD) 120 MG 24 hr capsule TAKE 1 CAPSULE BY MOUTH DAILY 90 capsule 2   ferrous gluconate (FERGON) 324 MG tablet Take 324 mg by mouth daily with breakfast.     flecainide (TAMBOCOR) 50 MG tablet Take 1.5 tablets (75 mg total) by mouth daily. 135 tablet 3   fluticasone-salmeterol (ADVAIR HFA) 115-21 MCG/ACT inhaler Inhale 2 puffs into the lungs 2 (two) times daily. (Patient taking differently: Inhale 2 puffs into the lungs daily as needed (Breathing).) 12 g 5   furosemide (LASIX) 20 MG tablet TAKE 1 TABLET BY MOUTH DAILY AS NEEDED FOR SWELLING OR WEIGHT GAIN 30 tablet 5   montelukast (SINGULAIR) 10 MG tablet TAKE 1 TABLET(10 MG) BY MOUTH AT BEDTIME 90 tablet 0   Multiple Vitamin (MULTIVITAMIN) capsule Take 1 capsule by mouth daily. One a day     pantoprazole (PROTONIX) 40 MG tablet Take 1 tablet (40 mg total) by mouth daily. 30 tablet 0   rivaroxaban (XARELTO) 20 MG TABS tablet Take 1 tablet (20 mg total) by mouth daily with supper. 90 tablet 3   rosuvastatin (CRESTOR) 10 MG tablet TAKE 1 TABLET(10 MG) BY MOUTH DAILY 90 tablet 1   colchicine 0.6 MG tablet Take 1 tablet (0.6 mg total) by mouth 2 (two) times daily for 5 days. 10 tablet 0   No facility-administered medications prior to visit.   Allergies  Allergen Reactions   Lisinopril Cough     ROS: A complete ROS was performed with pertinent positives/negatives noted in the HPI. The remainder of the ROS are negative.    Objective:   Today's Vitals   04/21/23 0900  BP: 124/70  Pulse: 76  Temp: 97.9 F (36.6 C)  TempSrc: Temporal  SpO2: 96%  Weight: 238 lb (108 kg)  Height: 5\' 7"  (1.702 m)   GENERAL: Well-appearing, in NAD. Well nourished.  SKIN: Pink, warm and dry. No rash, lesion, ulceration, or ecchymoses.  HEENT:    HEAD: Normocephalic, non-traumatic.  EYES: Conjunctive pink without exudate. PERRL, EOMI.  EARS: External ear w/o redness, swelling, masses, or lesions. EAC clear. TM's intact, translucent  w/o bulging, appropriate landmarks visualized.  NOSE: Septum midline w/o deformity. Nares patent, mucosa pink and non-inflamed w/o drainage. No sinus tenderness.  THROAT: Uvula midline. Oropharynx clear. Mucus membranes pink and moist.  NECK: Trachea midline. Full ROM w/o pain or tenderness. No lymphadenopathy.  RESPIRATORY: Chest wall symmetrical. Respirations even and non-labored. Breath sounds clear to auscultation bilaterally.  CARDIAC: S1, S2 present, regular rate and rhythm. Peripheral pulses 2+ bilaterally.  EXTREMITIES: Without clubbing, cyanosis, or edema.  NEUROLOGIC: No motor or sensory deficits. Steady, even gait.  PSYCH/MENTAL STATUS: Alert, oriented x 3. Cooperative, appropriate mood and affect.    No results  found for any visits on 04/21/23.    Assessment & Plan:  Assessment and Plan    Chronic Sinusitis: Chronic nasal congestion and anosmia. No acute sinusitis symptoms. Currently using Fluticasone nasal spray. -Continue Fluticasone nasal spray 1-2 times daily. -Add Astepro (Azelastine) nasal spray as needed for additional congestion.  Dizziness/Imbalance: Chronic imbalance, worsened post-ablation. No vertigo. Possible orthostatic hypotension. -Advise to rise slowly from lying, sitting, or standing positions and get balance before moving. -Consider further evaluation if symptoms persist or worsen.  General Health Maintenance: -Return to work, provide necessary documentation. -Follow-up with cardiologist as needed.       No orders of the defined types were placed in this encounter.  Lab Orders  No laboratory test(s) ordered today   No images are attached to the encounter or orders placed in the encounter.  Return for Scheduled Routine Office Visits and as needed.   Of note, portions of this note may have been created with voice recognition software Physicist, medical). While this note has been edited for accuracy, occasional wrong-word or 'sound-a-like' substitutions  may have occurred due to the inherent limitations of voice recognition software.   Salvatore Decent, FNP

## 2023-04-21 NOTE — Patient Instructions (Addendum)
Continue Flonase nasal spray  Can also use Astepro Allergy Nasal spray   Rise slowly when changing positions from lying, sitting, standing.

## 2023-04-22 ENCOUNTER — Institutional Professional Consult (permissible substitution) (HOSPITAL_BASED_OUTPATIENT_CLINIC_OR_DEPARTMENT_OTHER): Payer: BC Managed Care – PPO | Admitting: Pulmonary Disease

## 2023-04-24 NOTE — Telephone Encounter (Signed)
Completed (second) [employer] disability form scanned to patient's documents, faxed to [employer] and originals sent to patient.

## 2023-05-01 ENCOUNTER — Other Ambulatory Visit: Payer: Self-pay | Admitting: Nurse Practitioner

## 2023-05-01 DIAGNOSIS — E1169 Type 2 diabetes mellitus with other specified complication: Secondary | ICD-10-CM

## 2023-05-01 DIAGNOSIS — I10 Essential (primary) hypertension: Secondary | ICD-10-CM

## 2023-05-06 ENCOUNTER — Encounter (HOSPITAL_COMMUNITY): Payer: Self-pay | Admitting: Physician Assistant

## 2023-05-06 ENCOUNTER — Ambulatory Visit (HOSPITAL_COMMUNITY)
Admission: RE | Admit: 2023-05-06 | Discharge: 2023-05-06 | Disposition: A | Discharge status: Home / Self Care | Payer: BC Managed Care – PPO | Source: Ambulatory Visit | Attending: Physician Assistant | Admitting: Physician Assistant

## 2023-05-06 VITALS — BP 130/72 | HR 82 | Ht 67.0 in | Wt 238.8 lb

## 2023-05-06 DIAGNOSIS — Z79899 Other long term (current) drug therapy: Secondary | ICD-10-CM | POA: Insufficient documentation

## 2023-05-06 DIAGNOSIS — J45909 Unspecified asthma, uncomplicated: Secondary | ICD-10-CM | POA: Insufficient documentation

## 2023-05-06 DIAGNOSIS — E119 Type 2 diabetes mellitus without complications: Secondary | ICD-10-CM | POA: Insufficient documentation

## 2023-05-06 DIAGNOSIS — D6869 Other thrombophilia: Secondary | ICD-10-CM | POA: Insufficient documentation

## 2023-05-06 DIAGNOSIS — E669 Obesity, unspecified: Secondary | ICD-10-CM | POA: Insufficient documentation

## 2023-05-06 DIAGNOSIS — Z7901 Long term (current) use of anticoagulants: Secondary | ICD-10-CM | POA: Insufficient documentation

## 2023-05-06 DIAGNOSIS — I495 Sick sinus syndrome: Secondary | ICD-10-CM | POA: Diagnosis not present

## 2023-05-06 DIAGNOSIS — I110 Hypertensive heart disease with heart failure: Secondary | ICD-10-CM | POA: Insufficient documentation

## 2023-05-06 DIAGNOSIS — Z8673 Personal history of transient ischemic attack (TIA), and cerebral infarction without residual deficits: Secondary | ICD-10-CM | POA: Diagnosis not present

## 2023-05-06 DIAGNOSIS — I5020 Unspecified systolic (congestive) heart failure: Secondary | ICD-10-CM | POA: Diagnosis not present

## 2023-05-06 DIAGNOSIS — I4819 Other persistent atrial fibrillation: Secondary | ICD-10-CM | POA: Diagnosis not present

## 2023-05-06 DIAGNOSIS — Z6837 Body mass index (BMI) 37.0-37.9, adult: Secondary | ICD-10-CM | POA: Diagnosis not present

## 2023-05-06 NOTE — Progress Notes (Signed)
Primary Care Physician: Anne Ng, NP Primary Cardiologist: Dr Lynnette Caffey Primary Electrophysiologist: Dr Nelly Laurence  Referring Physician: [facility]   Stacy Chapman is a 67 y.o. female with a history of HTN, DM, CVA, asthma, and paroxysmal atrial fibrillation who presents for follow up in the [facility].  The patient was initially diagnosed with atrial fibrillation remotely and had previously been on Xarelto per ER report. More recently, patient was seen at the ER 08/21/19 with symptoms of SOB and generalized weakness and was found by EMS to be in afib. She did not undergo DCCV as onset of afib was unclear and she was not on anticoagulation. She was discharged on Xarelto for a CHADS2VASC score of 5. Patient reports that she had two close family members pass away the day before her symptom onset. She does admit to alcohol use. She denies significant snoring. Patient wore a Zio patch which showed 100% afib burden which was rate controlled overall. She has been maintained on flecainide.   Patient was admitted 09/2022 after a syncopal event. She was found to be going in and out of afib, having post termination pauses up to 4 seconds. This was in the setting of acute influenza infection. Patient also admitted that she had been out of diltiazem for several days and had only been taking the flecainide once daily. Her medication was resumed and prescribed doses at discharge. Echo showed mildly reduced EF 40-45%. She was also given a two week Zio monitor to evaluate her arrhythmia burden and for pauses. Syncope was felt to be due to dehydration/acute illness.   Zio monitor showed 19% afib burden with frequent pauses, longest episode was 8.5 seconds. Pauses were predominately nocturnal. She was seen by Dr Nelly Laurence and underwent afib ablation on 04/08/23. She was seen at the ED 7/1 with left knee pain and numbness, workup unremarkable, not felt to be related to the  ablation.   On follow up today, patient reports that she feels better now than she has in a long time. She is able to walk much further without becoming SOB. She remains in SR. She denies chest pain, swallowing pain, or groin issues. No bleeding issues on anticoagulation.   Today, she denies symptoms of palpitations, chest pain, orthopnea, PND, lower extremity edema, dizziness, presyncope, syncope, snoring, daytime somnolence, bleeding, or neurologic sequela. The patient is tolerating medications without difficulties and is otherwise without complaint today.    Atrial Fibrillation Risk Factors:  she does not have symptoms or diagnosis of sleep apnea. she does not have a history of rheumatic fever. she does have a history of alcohol use. The patient does not have a history of early familial atrial fibrillation or other arrhythmias.   Atrial Fibrillation Management history:  Previous antiarrhythmic drugs: flecainide Previous cardioversions: none Previous ablations: 04/08/23 Anticoagulation history: Xarelto, warfarin    Past Medical History:  Diagnosis Date   Anemia    Arthritis    Asthma    Diabetes (HCC)    Diarrhea of presumed infectious origin 10/09/2022   Essential hypertension    Morbid obesity (HCC)    Persistent atrial fibrillation (HCC)    Stroke (HCC) 2018   TIA    ROS- All systems are reviewed and negative except as per the HPI above.  Physical Exam: Vitals:   05/06/23 1025  BP: 130/72  Pulse: 82  Weight: 108.3 kg  Height: 5\' 7"  (1.702 m)    GEN: Well nourished, well developed in  no acute distress NECK: No JVD; No carotid bruits CARDIAC: Regular rate and rhythm, no murmurs, rubs, gallops RESPIRATORY:  Clear to auscultation without rales, wheezing or rhonchi  ABDOMEN: Soft, non-tender, non-distended EXTREMITIES:  No edema; No deformity    Wt Readings from Last 3 Encounters:  05/06/23 108.3 kg  04/21/23 108 kg  04/15/23 109 kg    EKG today  demonstrates  SR, PAC Vent. rate 82 BPM PR interval 154 ms QRS duration 74 ms QT/QTcB 382/446 ms  Echo 01/22/23  1. Left ventricular ejection fraction, by estimation, is 50 to 55%. The  left ventricle has low normal function. The left ventricle has no regional  wall motion abnormalities. Left ventricular diastolic parameters are  indeterminate.   2. Right ventricular systolic function is normal. The right ventricular  size is normal. There is moderately elevated pulmonary artery systolic  pressure.   3. Left atrial size was mildly dilated.   4. Mitral regurgitation worst in the [facility] view. The mitral valve is  grossly normal. Moderate to severe mitral valve regurgitation.   5. Tricuspid valve regurgitation is mild to moderate.   6. The aortic valve was not well visualized. Aortic valve regurgitation  is mild.   Comparison(s): Prior images reviewed side by side. Mitrla regurgitation  has increased from prior.     Epic records are reviewed at length today  CHA2DS2-VASc Score = 7  The patient's score is based upon: CHF History: 1 HTN History: 1 Diabetes History: 1 Stroke History: 2 Vascular Disease History: 0 Age Score: 1 Gender Score: 1       ASSESSMENT AND PLAN: Persistent Atrial Fibrillation (ICD10:  I48.19) The patient's CHA2DS2-VASc score is 7, indicating a 11.2% annual risk of stroke.   S/p afib ablation 04/08/23 Patient appears to be maintaining SR. Continue flecainide 75 mg BID Continue diltiazem 120 mg daily Continue Xarelto 20 mg daily with no missed doses for 3 months post ablation.   Secondary Hypercoagulable State (ICD10:  D68.69) The patient is at significant risk for stroke/thromboembolism based upon her CHA2DS2-VASc Score of 7.  Continue Warfarin (Coumadin).   Obesity Body mass index is 37.4 kg/m.  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 No indication for PPM at this time.   Valvular heart disease MR, followed by Dr Lynnette Caffey Repeat echo scheduled for 06/19/23   Follow up with Dr Nelly Laurence as scheduled.    Jorja Loa PA-C [facility] [address] [place], [place] [address] [phone] 05/06/2023 11:26 AM

## 2023-05-11 ENCOUNTER — Other Ambulatory Visit: Payer: Self-pay | Admitting: Nurse Practitioner

## 2023-05-11 DIAGNOSIS — J4521 Mild intermittent asthma with (acute) exacerbation: Secondary | ICD-10-CM

## 2023-05-19 ENCOUNTER — Other Ambulatory Visit: Payer: Self-pay | Admitting: Nurse Practitioner

## 2023-05-19 DIAGNOSIS — I10 Essential (primary) hypertension: Secondary | ICD-10-CM

## 2023-05-19 DIAGNOSIS — E1169 Type 2 diabetes mellitus with other specified complication: Secondary | ICD-10-CM

## 2023-05-31 ENCOUNTER — Other Ambulatory Visit (HOSPITAL_COMMUNITY): Payer: Self-pay | Admitting: Physician Assistant

## 2023-06-01 ENCOUNTER — Ambulatory Visit: Payer: BC Managed Care – PPO | Admitting: Cardiovascular Disease

## 2023-06-16 ENCOUNTER — Other Ambulatory Visit: Payer: Self-pay

## 2023-06-16 ENCOUNTER — Encounter: Payer: Self-pay | Admitting: Physician Assistant

## 2023-06-16 ENCOUNTER — Telehealth: Payer: Self-pay | Admitting: Internal Medicine

## 2023-06-16 ENCOUNTER — Other Ambulatory Visit: Payer: Self-pay | Admitting: Physician Assistant

## 2023-06-16 DIAGNOSIS — I340 Nonrheumatic mitral (valve) insufficiency: Secondary | ICD-10-CM

## 2023-06-16 DIAGNOSIS — I495 Sick sinus syndrome: Secondary | ICD-10-CM

## 2023-06-16 DIAGNOSIS — I480 Paroxysmal atrial fibrillation: Secondary | ICD-10-CM

## 2023-06-16 MED ORDER — RIVAROXABAN 20 MG PO TABS
20.0000 mg | ORAL_TABLET | Freq: Every day | ORAL | 3 refills | Status: DC
Start: 2023-06-16 — End: 2024-06-20

## 2023-06-16 NOTE — Telephone Encounter (Signed)
Prescription refill request for Xarelto received.  Indication:afib Last office visit:7/24 Weight:108.3  kg Age:67 Scr:1.05  6/24 CrCl:90.11  ml/min  Prescription refilled

## 2023-06-16 NOTE — Telephone Encounter (Signed)
Error

## 2023-06-16 NOTE — Telephone Encounter (Signed)
*  STAT* If patient is at the pharmacy, call can be transferred to refill team.   1. Which medications need to be refilled? (please list name of each medication and dose if known)   rivaroxaban (XARELTO) 20 MG TABS tablet     2. Would you like to learn more about the convenience, safety, & potential cost savings by using the [facility]? No   3. Are you open to using the [facility] (Type [facility] ) No   4. Which pharmacy/location (including street and city if local pharmacy) is medication to be sent to?[id number] - [place], [employer] - [address] AT [address]    5. Do they need a 30 day or 90 day supply? 90 day

## 2023-06-17 NOTE — Telephone Encounter (Signed)
Pantoprazole prescribed during hospital admission by Francis Dowse, PA while pt was there for ablation by [facility]. Will send refills in to carry patient through until she sees primary cardiologist again.

## 2023-06-22 ENCOUNTER — Ambulatory Visit (HOSPITAL_COMMUNITY): Payer: BC Managed Care – PPO | Attending: Internal Medicine

## 2023-06-22 ENCOUNTER — Telehealth: Payer: Self-pay | Admitting: *Deleted

## 2023-06-22 ENCOUNTER — Telehealth: Payer: Self-pay | Admitting: Cardiovascular Disease

## 2023-06-22 DIAGNOSIS — I495 Sick sinus syndrome: Secondary | ICD-10-CM | POA: Diagnosis not present

## 2023-06-22 DIAGNOSIS — I480 Paroxysmal atrial fibrillation: Secondary | ICD-10-CM | POA: Diagnosis not present

## 2023-06-22 DIAGNOSIS — I340 Nonrheumatic mitral (valve) insufficiency: Secondary | ICD-10-CM | POA: Diagnosis not present

## 2023-06-22 LAB — ECHOCARDIOGRAM COMPLETE
Area-P 1/2: 3.21 cm2
S' Lateral: 3.4 cm

## 2023-06-22 NOTE — Telephone Encounter (Signed)
Pt c/o medication issue:  1. Name of Medication:   rivaroxaban (XARELTO) 20 MG TABS tablet    2. How are you currently taking this medication (dosage and times per day)?    3. Are you having a reaction (difficulty breathing--STAT)? no  4. What is your medication issue? Calling to see if there a discount card for this medication. Please advise

## 2023-06-22 NOTE — Telephone Encounter (Signed)
Refill dept left the copay card for xarelto at the front desk for the pt to pick up.  Pt will pick this up at her earliest convenience. Operator informed the pt of this when she returned a callback to the office.

## 2023-06-22 NOTE — Telephone Encounter (Signed)
Pt had an echo done in our office today.  Echo tech completed her test and came to triage to ask for a nurse to go and speak with there, at the request of the pt.  Per Echo Tech, pt wanted to talk about ongoing sob with exertion and to get this concern to Dr. Lynnette Caffey and RN.   Went and spoke with the pt.   Pt states she wanted to let Dr. Lynnette Caffey know that over the last week or so, she has noticed she is getting more sob with exertion.  She said it feels like she can't get a full breath in when she exerts.  She denies chest pain, palpitations, fluttering, orthopnea, pre-syncope or syncope.   She informed me that she had s/p afib ablation about 90 days ago, with Dr. Nelly Laurence.  She is also followed by afib clinic.  She states she can't really tell if she is in afib or not, and unsure if increased sob w/exertion is coming from that.    Pts BP today in our office was 142/85.  Echo tech didn't mention any afib during the pts exam today.  Pt was here for her echo that was ordered by Dr. Lynnette Caffey, to reassess her MR.  Echo tech mentioned to me that it appeared her MR has slightly improved and not worsened, at today's exam with the pt.  Upon my assessment, pt was in no obvious distress and was able to converse clearly to me when speaking.  She had no lower extremity edema. She only voiced complaint of sob with exertion over the last week or so.  Pt does have an appt with Dr. Lynnette Caffey for follow-up next Monday 9/16, and was inquiring if she should be seen before then, due to increased sob.  No sooner appts available at this time with Dr. Lynnette Caffey or an APP, before her scheduled appt on 9/16.    Being pt was in no acute distress and looked good while conversing with her, I advised her to go home and I will get this message to Dr. Lynnette Caffey and RN for further advisement and follow-up. She is also aware they will touch base with her about her echo results.    ED precautions provided to the pt, if sob  worsens/persist and/or other cardiac symptoms occur, if the meantime between now and when she hears back from them.   Advised her to keep her scheduled visit with Dr. Lynnette Caffey next Monday, unless otherwise advised differently by his RN.    Pt verbalized understanding and agrees with this plan.    Off note: Pt inquired about her Xarelto cost being $500 a month given she is in the doughnut hole.  Pt is still working and has [employer].  Was able to provide her with the $10 copay card to take by her pharmacy for her next fill of Xarelto.  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

## 2023-06-28 NOTE — H&P (View-Only) (Signed)
Patient ID: Stacy Chapman MRN: [id number] DOB/AGE: [birth year] 67 y.o.  Primary Care Physician:Nche, Bonna Gains, NP Primary Cardiologist: Kristeen Miss, MD Referring Cardiologist: Halford Chessman, MD   FOCUSED CARDIOVASCULAR PROBLEM LIST:   1.  Atrial fibrillation on Xarelto with tachy-bradycardia syndrome s/p PVI June 2024; CV 2 score of 7 2.  Iron deficiency anemia 3.  Hyperlipidemia 4.  Aortic atherosclerosis on CT abdomen pelvis 2023 5.  BMI of 36 6.  Type 2 diabetes diet controlled 7.  Hypertension 8.  Stage III chronic kidney disease 9.  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 female with the indicated medical history here for recommendations regarding the patient's severe mitral regurgitation seen on recent echocardiogram.  The patient has been followed by electrophysiology department for some time.  She is initially diagnosed with atrial fibrillation in 2020.  She was started on Xarelto.  Later she was started on flecainide.  It looks like she was eventually changed to Coumadin due to financial issues with Xarelto.  She was seen by Dr. Hyacinth Meeker recently.  Monitor demonstrated fairly significant pauses and for this reason plan was conceived to pursue atrial fibrillation ablation.  An echocardiogram was performed which demonstrated significant mitral regurgitation different than her previous echocardiogram.  The patient works full-time at [employer].  She walks quite a bit every day.  She has not really noticed any increasing shortness of breath.  On occasion she will get short of breath when she goes to the bathroom.  She will stop and rest and then resume.  She has had no dizzy spells since seeing EP recently.  She has not had any bleeding issues while on Coumadin.  She is required no emergency room visits or hospitalizations.  In general she is able to do all of her activities of daily living.  Plan:   D/C Coumadin, start Xarelto, follow up after atrial fibrillation ablation.  Sept 2024:  The patient underwent atrial fibrillation ablation in June.  An echocardiogram in early September demonstrated preserved LV function with mitral regurgitation.  On my review it looks like the mitral regurgitation is only in the systolic phase and early in systole but it does reach all the way back to the pulmonary veins.  The patient tells me she was well up until earlier this month.  After the ablation she felt much better in terms of her breathing.  However over the last couple of weeks she has developed increasing shortness of breath with an NYHA class II symptoms.  She has not been taking her as needed Lasix.  She has noticed some mild peripheral edema.  She denies any exertional chest pain.  She is not short of breath at rest.  Past Medical History:  Diagnosis Date   Anemia    Arthritis    Asthma    Diabetes (HCC)    Diarrhea of presumed infectious origin 10/09/2022   Essential hypertension    Morbid obesity (HCC)    Persistent atrial fibrillation (HCC)    Stroke (HCC) 2018   TIA    Past Surgical History:  Procedure Laterality Date   ATRIAL FIBRILLATION ABLATION N/A 04/08/2023   Procedure: ATRIAL FIBRILLATION ABLATION;  Surgeon: Maurice Small, MD;  Location: [facility];  Service: Cardiovascular;  Laterality: N/A;   KNEE ARTHROSCOPY     TONSILLECTOMY     TUBAL LIGATION  1991    Family  History  Problem Relation Age of Onset   Stroke Father 28       death   Hypertension Father    Dementia Sister    Diabetes Sister    Eczema Sister    Dementia Sister    Cancer Paternal Uncle        unknown   Hypertension Brother    Stroke Brother 69       death   Cancer Brother        lung   Colon cancer Neg Hx    Colon polyps Neg Hx    Stomach cancer Neg Hx    Rectal cancer Neg Hx    Esophageal cancer Neg Hx    Breast cancer Neg Hx     Social History   Socioeconomic History    Marital status: Divorced    Spouse name: Not on file   Number of children: Not on file   Years of education: Not on file   Highest education level: Not on file  Occupational History   Not on file  Tobacco Use   Smoking status: Never   Smokeless tobacco: Never   Tobacco comments:    Never smoke 05/01/22  Vaping Use   Vaping status: Never Used  Substance and Sexual Activity   Alcohol use: Not Currently    Alcohol/week: 6.0 standard drinks of alcohol    Types: 1 Glasses of wine, 5 Standard drinks or equivalent per week    Comment: Drinks multiple times a week   Drug use: No   Sexual activity: Yes    Birth control/protection: Post-menopausal, Surgical    Comment: s/p tubal ligation  Other Topics Concern   Not on file  Social History Narrative   Lives in [place] and works in a [employer]   Social Determinants of Health   Financial Resource Strain: Low Risk  (01/15/2023)   Overall Financial Resource Strain (CARDIA)    Difficulty of Paying Living Expenses: Not hard at all  Food Insecurity: No Food Insecurity (01/15/2023)   Hunger Vital Sign    Worried About Running Out of Food in the Last Year: Never true    Ran Out of Food in the Last Year: Never true  Transportation Needs: No Transportation Needs (10/09/2022)   PRAPARE - Administrator, Civil Service (Medical): No    Lack of Transportation (Non-Medical): No  Physical Activity: Sufficiently Active (01/15/2023)   Exercise Vital Sign    Days of Exercise per Week: 5 days    Minutes of Exercise per Session: 30 min  Stress: Stress Concern Present (01/15/2023)   [employer] of Occupational Health - Occupational Stress Questionnaire    Feeling of Stress : Rather much  Social Connections: Moderately Isolated (01/15/2023)   Social Connection and Isolation Panel [NHANES]    Frequency of Communication with Friends and Family: More than three times a week    Frequency of Social Gatherings with Friends and Family: More than  three times a week    Attends Religious Services: More than 4 times per year    Active Member of [employer] or Organizations: No    Attends Banker Meetings: Never    Marital Status: Divorced  Catering manager Violence: Not At Risk (10/09/2022)   Humiliation, Afraid, Rape, and Kick questionnaire    Fear of Current or Ex-Partner: No    Emotionally Abused: No    Physically Abused: No    Sexually Abused: No     Prior to Admission  medications   Medication Sig Start Date End Date Taking? Authorizing Provider  acetaminophen (TYLENOL) 500 MG tablet Take 1,000 mg by mouth as needed for moderate pain.    [provider]  albuterol (VENTOLIN HFA) 108 (90 Base) MCG/ACT inhaler INHALE 1 TO 2 PUFFS INTO THE LUNGS EVERY 6 HOURS AS NEEDED FOR WHEEZING OR SHORTNESS OF BREATH 01/08/23   Nche, Bonna Gains, NP  azelastine (ASTELIN) 0.1 % nasal spray Place 1 spray into both nostrils 2 (two) times daily. Use in each nostril as directed 08/11/22   Nche, Bonna Gains, NP  benzonatate (TESSALON) 200 MG capsule Take 1 capsule (200 mg total) by mouth 2 (two) times daily as needed. 01/15/23   Nche, Bonna Gains, NP  Biotin 2.5 MG TABS Take 1 tablet by mouth daily.    [provider]  diltiazem (CARDIZEM CD) 120 MG 24 hr capsule TAKE 1 CAPSULE BY MOUTH DAILY 10/08/22   Fenton, Clint R, PA  famotidine (PEPCID) 20 MG tablet Take 1 tablet (20 mg total) by mouth 2 (two) times daily. 12/22/22   Nche, Bonna Gains, NP  ferrous gluconate (FERGON) 324 MG tablet Take 324 mg by mouth daily with breakfast.    [provider]  flecainide (TAMBOCOR) 50 MG tablet Take 1.5 tablets (75 mg total) by mouth 2 (two) times daily. 09/15/22   Fenton, Clint R, PA  fluticasone (FLONASE) 50 MCG/ACT nasal spray SHAKE LIQUID AND USE 2 SPRAYS IN EACH NOSTRIL DAILY 01/24/20   Nche, Bonna Gains, NP  fluticasone-salmeterol (ADVAIR HFA) 115-21 MCG/ACT inhaler Inhale 2 puffs into the lungs 2 (two) times daily.  01/29/23   Nche, Bonna Gains, NP  furosemide (LASIX) 20 MG tablet TAKE 1 TABLET BY MOUTH DAILY AS NEEDED FOR SWELLING OR WEIGHT GAIN 10/14/22   Fenton, Clint R, PA  gabapentin (NEURONTIN) 300 MG capsule Take 300 mg by mouth at bedtime. 01/08/23   [provider]  montelukast (SINGULAIR) 10 MG tablet TAKE 1 TABLET(10 MG) BY MOUTH AT BEDTIME 01/12/23   Nche, Bonna Gains, NP  Multiple Vitamin (MULTIVITAMIN) capsule Take 1 capsule by mouth daily.    [provider]  rosuvastatin (CRESTOR) 10 MG tablet TAKE 1 TABLET(10 MG) BY MOUTH DAILY 01/22/23   Nche, Bonna Gains, NP  warfarin (COUMADIN) 5 MG tablet TAKE 1 TABLET TO 2 TABLETS BY MOUTH DAILY AS DIRECTED BY COUMADIN CLINIC 11/17/22   Nahser, Deloris Ping, MD    Allergies  Allergen Reactions   Lisinopril Cough    REVIEW OF SYSTEMS:  General: no fevers/chills/night sweats Eyes: no blurry vision, diplopia, or amaurosis ENT: no sore throat or hearing loss Resp: no cough, wheezing, or hemoptysis CV: no edema or palpitations GI: no abdominal pain, nausea, vomiting, diarrhea, or constipation GU: no dysuria, frequency, or hematuria Skin: no rash Neuro: no headache, numbness, tingling, or weakness of extremities Musculoskeletal: no joint pain or swelling Heme: no bleeding, DVT, or easy bruising Endo: no polydipsia or polyuria  BP (!) 140/82   Pulse (!) 48   Ht 5' 7.5" (1.715 m)   Wt 241 lb (109.3 kg)   SpO2 99%   BMI 37.19 kg/m   PHYSICAL EXAM: GEN:  AO x 3 in no acute distress HEENT: normal Dentition: Normal Neck: JVP normal. +2 carotid upstrokes without bruits. No thyromegaly. Lungs: equal expansion, clear bilaterally CV: Apex is discrete and nondisplaced, irregular RR without murmur or gallop Abd: soft, non-tender, non-distended; no bruit; positive bowel sounds Ext: no edema, ecchymoses, or cyanosis Vascular: 2+  femoral pulses, 2+ radial pulses       Skin: warm and dry without rash Neuro: CN II-XII grossly intact;  motor and sensory grossly intact    DATA AND STUDIES:  EKG: Performed today that I personally reviewed demonstrates sinus rhythm with PACs  2D ECHO:  December 2023 1. Left ventricular ejection fraction, by estimation, is 40 to 45%. The  left ventricle has mildly decreased function. The left ventricle  demonstrates global hypokinesis. There is mild concentric left ventricular  hypertrophy. Left ventricular diastolic  parameters are indeterminate.   2. Right ventricular systolic function is normal. The right ventricular  size is normal. There is normal pulmonary artery systolic pressure.   3. The mitral valve is normal in structure. Mild mitral valve  regurgitation. No evidence of mitral stenosis.   4. The aortic valve is normal in structure. Aortic valve regurgitation is  trivial. No aortic stenosis is present.   5. The inferior vena cava is normal in size with greater than 50%  respiratory variability, suggesting right atrial pressure of 3 mmHg.   April 2024 1. Left ventricular ejection fraction, by estimation, is 50 to 55%. The  left ventricle has low normal function. The left ventricle has no regional  wall motion abnormalities. Left ventricular diastolic parameters are  indeterminate.   2. Right ventricular systolic function is normal. The right ventricular  size is normal. There is moderately elevated pulmonary artery systolic  pressure.   3. Left atrial size was mildly dilated.   4. Mitral regurgitation worst in the [facility] view. The mitral valve is  grossly normal. Moderate to severe mitral valve regurgitation.   5. Tricuspid valve regurgitation is mild to moderate.   6. The aortic valve was not well visualized. Aortic valve regurgitation  is mild.   TEE:n/a  CARDIAC CATH: n/a  STS RISK CALCULATOR: pending  NHYA CLASS: 2    ASSESSMENT AND PLAN:   Persistent atrial fibrillation (HCC) - Plan: EKG 12-Lead  Hypercoagulable state due to persistent atrial fibrillation  (HCC)  Nonrheumatic mitral valve regurgitation  BMI 36.0-36.9,adult  Type 2 diabetes mellitus with complication, without long-term current use of insulin (HCC)  Hypertension associated with diabetes (HCC)  Hyperlipidemia associated with type 2 diabetes mellitus (HCC)  Stage 3a chronic kidney disease (HCC)  Aortic atherosclerosis (HCC)  Snoring  The patient underwent PVI in June but has developed recurrent shortness of breath.  I reviewed her echocardiogram which does demonstrate mitral regurgitation going back to the pulmonary vein that is relatively of short duration in systole.  Her EKG today demonstrates that she is in sinus rhythm.  I will go ahead and stop her diltiazem and start her on Toprol 12.5 mg at bedtime.  Additionally we will start losartan 25 mg, spironolactone 25 mg, and Jardiance 10 mg.  I will have her take her Lasix 20 mg as needed every day.  I will check labs next week.  She does snore so I will refer her for sleep apnea evaluation and have her meet with pharmacy regarding GLP-1 receptor agonist therapy given her elevated BMI and diet-controlled diabetes.  Given her ongoing shortness of breath despite normal sinus rhythm I will refer her for a TEE to better evaluate her mitral regurgitation.  Depending on this we could consider mitral transcatheter edge-to-edge repair (she reports good dental health and sees a dentist yearly) and if this were the case we would have her see advanced heart failure for an opinion and I would refer her for  coronary angiography and right heart catheterization study.  Otherwise I will see her back in 6 months with another echocardiogram.  I have personally reviewed the patients imaging data as summarized above.  I have reviewed the natural history of mitral regurgitation with the patient and family members who are present today. We have discussed the limitations of medical therapy and the poor prognosis associated with symptomatic mitral  regurgitation. We have also reviewed potential treatment options, including palliative medical therapy, conventional mitral surgery, and transcatheter mitral edge-to-edge repair. We discussed treatment options in the context of this patient's specific comorbid medical conditions.   All of the patient's questions were answered today. Will make further recommendations based on the results of studies outlined above.   Total time spent with patient today 60 minutes. This includes reviewing records, evaluating the patient and coordinating care.   Orbie Pyo, MD  06/29/2023 11:01 AM    [facility] [address] [place], [place], [place]  [address] Phone: [phone]; Fax: [phone]

## 2023-06-28 NOTE — Progress Notes (Unsigned)
Patient ID: Stacy Chapman MRN: [id number] DOB/AGE: [birth year] 67 y.o.  Primary Care Physician:Nche, Bonna Gains, NP Primary Cardiologist: Kristeen Miss, MD Referring Cardiologist: Halford Chessman, MD   FOCUSED CARDIOVASCULAR PROBLEM LIST:   1.  Atrial fibrillation on warfarin (unable to afford DOAC) with tachy-bradycardia syndrome; CV 2 score of 7 2.  Iron deficiency anemia 3.  Hyperlipidemia 4.  Aortic atherosclerosis on CT abdomen pelvis 2023 5.  BMI of 36 6.  Type 2 diabetes diet controlled 7.  Hypertension 8.  Stage III chronic kidney disease 9.  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 female with the indicated medical history here for recommendations regarding the patient's severe mitral regurgitation seen on recent echocardiogram.  The patient has been followed by electrophysiology department for some time.  She is initially diagnosed with atrial fibrillation in 2020.  She was started on Xarelto.  Later she was started on flecainide.  It looks like she was eventually changed to Coumadin due to financial issues with Xarelto.  She was seen by Dr. Hyacinth Meeker recently.  Monitor demonstrated fairly significant pauses and for this reason plan was conceived to pursue atrial fibrillation ablation.  An echocardiogram was performed which demonstrated significant mitral regurgitation different than her previous echocardiogram.  The patient works full-time at [employer].  She walks quite a bit every day.  She has not really noticed any increasing shortness of breath.  On occasion she will get short of breath when she goes to the bathroom.  She will stop and rest and then resume.  She has had no dizzy spells since seeing EP recently.  She has not had any bleeding issues while on Coumadin.  She is required no emergency room visits or hospitalizations.  In general she is able to do all of her activities of daily living.   Plan:  D/C Coumadin, start Xarelto, follow up after atrial fibrillation ablation.  Sept 2024:  The patient underwent atrial fibrillation ablation in June.  An echocardiogram in early September demonstrated preserved LV function with ***   Past Medical History:  Diagnosis Date   Anemia    Arthritis    Asthma    Diabetes (HCC)    Diarrhea of presumed infectious origin 10/09/2022   Essential hypertension    Morbid obesity (HCC)    Persistent atrial fibrillation (HCC)    Stroke (HCC) 2018   TIA    Past Surgical History:  Procedure Laterality Date   ATRIAL FIBRILLATION ABLATION N/A 04/08/2023   Procedure: ATRIAL FIBRILLATION ABLATION;  Surgeon: Maurice Small, MD;  Location: [facility];  Service: Cardiovascular;  Laterality: N/A;   KNEE ARTHROSCOPY     TONSILLECTOMY     TUBAL LIGATION  1991    Family History  Problem Relation Age of Onset   Stroke Father 48       death   Hypertension Father    Dementia Sister    Diabetes Sister    Eczema Sister    Dementia Sister    Cancer Paternal Uncle        unknown   Hypertension Brother    Stroke Brother 32       death   Cancer Brother        lung   Colon cancer Neg Hx    Colon polyps Neg Hx    Stomach cancer Neg Hx    Rectal cancer Neg Hx  Esophageal cancer Neg Hx    Breast cancer Neg Hx     Social History   Socioeconomic History   Marital status: Divorced    Spouse name: Not on file   Number of children: Not on file   Years of education: Not on file   Highest education level: Not on file  Occupational History   Not on file  Tobacco Use   Smoking status: Never   Smokeless tobacco: Never   Tobacco comments:    Never smoke 05/01/22  Vaping Use   Vaping status: Never Used  Substance and Sexual Activity   Alcohol use: Not Currently    Alcohol/week: 6.0 standard drinks of alcohol    Types: 1 Glasses of wine, 5 Standard drinks or equivalent per week    Comment: Drinks multiple times a week   Drug use: No    Sexual activity: Yes    Birth control/protection: Post-menopausal, Surgical    Comment: s/p tubal ligation  Other Topics Concern   Not on file  Social History Narrative   Lives in [place] and works in a [employer]   Social Determinants of Health   Financial Resource Strain: Low Risk  (01/15/2023)   Overall Financial Resource Strain (CARDIA)    Difficulty of Paying Living Expenses: Not hard at all  Food Insecurity: No Food Insecurity (01/15/2023)   Hunger Vital Sign    Worried About Running Out of Food in the Last Year: Never true    Ran Out of Food in the Last Year: Never true  Transportation Needs: No Transportation Needs (10/09/2022)   PRAPARE - Administrator, Civil Service (Medical): No    Lack of Transportation (Non-Medical): No  Physical Activity: Sufficiently Active (01/15/2023)   Exercise Vital Sign    Days of Exercise per Week: 5 days    Minutes of Exercise per Session: 30 min  Stress: Stress Concern Present (01/15/2023)   [employer] of Occupational Health - Occupational Stress Questionnaire    Feeling of Stress : Rather much  Social Connections: Moderately Isolated (01/15/2023)   Social Connection and Isolation Panel [NHANES]    Frequency of Communication with Friends and Family: More than three times a week    Frequency of Social Gatherings with Friends and Family: More than three times a week    Attends Religious Services: More than 4 times per year    Active Member of [employer] or Organizations: No    Attends Banker Meetings: Never    Marital Status: Divorced  Catering manager Violence: Not At Risk (10/09/2022)   Humiliation, Afraid, Rape, and Kick questionnaire    Fear of Current or Ex-Partner: No    Emotionally Abused: No    Physically Abused: No    Sexually Abused: No     Prior to Admission medications   Medication Sig Start Date End Date Taking? Authorizing Provider  acetaminophen (TYLENOL) 500 MG tablet Take 1,000 mg by mouth as  needed for moderate pain.    [provider]  albuterol (VENTOLIN HFA) 108 (90 Base) MCG/ACT inhaler INHALE 1 TO 2 PUFFS INTO THE LUNGS EVERY 6 HOURS AS NEEDED FOR WHEEZING OR SHORTNESS OF BREATH 01/08/23   Nche, Bonna Gains, NP  azelastine (ASTELIN) 0.1 % nasal spray Place 1 spray into both nostrils 2 (two) times daily. Use in each nostril as directed 08/11/22   Nche, Bonna Gains, NP  benzonatate (TESSALON) 200 MG capsule Take 1 capsule (200 mg total) by mouth 2 (two) times  daily as needed. 01/15/23   Nche, Bonna Gains, NP  Biotin 2.5 MG TABS Take 1 tablet by mouth daily.    [provider]  diltiazem (CARDIZEM CD) 120 MG 24 hr capsule TAKE 1 CAPSULE BY MOUTH DAILY 10/08/22   Fenton, Clint R, PA  famotidine (PEPCID) 20 MG tablet Take 1 tablet (20 mg total) by mouth 2 (two) times daily. 12/22/22   Nche, Bonna Gains, NP  ferrous gluconate (FERGON) 324 MG tablet Take 324 mg by mouth daily with breakfast.    [provider]  flecainide (TAMBOCOR) 50 MG tablet Take 1.5 tablets (75 mg total) by mouth 2 (two) times daily. 09/15/22   Fenton, Clint R, PA  fluticasone (FLONASE) 50 MCG/ACT nasal spray SHAKE LIQUID AND USE 2 SPRAYS IN EACH NOSTRIL DAILY 01/24/20   Nche, Bonna Gains, NP  fluticasone-salmeterol (ADVAIR HFA) 115-21 MCG/ACT inhaler Inhale 2 puffs into the lungs 2 (two) times daily. 01/29/23   Nche, Bonna Gains, NP  furosemide (LASIX) 20 MG tablet TAKE 1 TABLET BY MOUTH DAILY AS NEEDED FOR SWELLING OR WEIGHT GAIN 10/14/22   Fenton, Clint R, PA  gabapentin (NEURONTIN) 300 MG capsule Take 300 mg by mouth at bedtime. 01/08/23   [provider]  montelukast (SINGULAIR) 10 MG tablet TAKE 1 TABLET(10 MG) BY MOUTH AT BEDTIME 01/12/23   Nche, Bonna Gains, NP  Multiple Vitamin (MULTIVITAMIN) capsule Take 1 capsule by mouth daily.    [provider]  rosuvastatin (CRESTOR) 10 MG tablet TAKE 1 TABLET(10 MG) BY MOUTH DAILY 01/22/23   Nche, Bonna Gains, NP   warfarin (COUMADIN) 5 MG tablet TAKE 1 TABLET TO 2 TABLETS BY MOUTH DAILY AS DIRECTED BY COUMADIN CLINIC 11/17/22   Nahser, Deloris Ping, MD    Allergies  Allergen Reactions   Lisinopril Cough    REVIEW OF SYSTEMS:  General: no fevers/chills/night sweats Eyes: no blurry vision, diplopia, or amaurosis ENT: no sore throat or hearing loss Resp: no cough, wheezing, or hemoptysis CV: no edema or palpitations GI: no abdominal pain, nausea, vomiting, diarrhea, or constipation GU: no dysuria, frequency, or hematuria Skin: no rash Neuro: no headache, numbness, tingling, or weakness of extremities Musculoskeletal: no joint pain or swelling Heme: no bleeding, DVT, or easy bruising Endo: no polydipsia or polyuria  There were no vitals taken for this visit.  PHYSICAL EXAM: GEN:  AO x 3 in no acute distress HEENT: normal Dentition: Normal Neck: JVP normal. +2 carotid upstrokes without bruits. No thyromegaly. Lungs: equal expansion, clear bilaterally CV: Apex is discrete and nondisplaced, irregular RR without murmur or gallop Abd: soft, non-tender, non-distended; no bruit; positive bowel sounds Ext: no edema, ecchymoses, or cyanosis Vascular: 2+ femoral pulses, 2+ radial pulses       Skin: warm and dry without rash Neuro: CN II-XII grossly intact; motor and sensory grossly intact    DATA AND STUDIES:  EKG: March 2024 atrial fibrillation  2D ECHO:  December 2023 1. Left ventricular ejection fraction, by estimation, is 40 to 45%. The  left ventricle has mildly decreased function. The left ventricle  demonstrates global hypokinesis. There is mild concentric left ventricular  hypertrophy. Left ventricular diastolic  parameters are indeterminate.   2. Right ventricular systolic function is normal. The right ventricular  size is normal. There is normal pulmonary artery systolic pressure.   3. The mitral valve is normal in structure. Mild mitral valve  regurgitation. No evidence of mitral  stenosis.   4. The aortic valve is normal in structure.  Aortic valve regurgitation is  trivial. No aortic stenosis is present.   5. The inferior vena cava is normal in size with greater than 50%  respiratory variability, suggesting right atrial pressure of 3 mmHg.   April 2024 1. Left ventricular ejection fraction, by estimation, is 50 to 55%. The  left ventricle has low normal function. The left ventricle has no regional  wall motion abnormalities. Left ventricular diastolic parameters are  indeterminate.   2. Right ventricular systolic function is normal. The right ventricular  size is normal. There is moderately elevated pulmonary artery systolic  pressure.   3. Left atrial size was mildly dilated.   4. Mitral regurgitation worst in the [facility] view. The mitral valve is  grossly normal. Moderate to severe mitral valve regurgitation.   5. Tricuspid valve regurgitation is mild to moderate.   6. The aortic valve was not well visualized. Aortic valve regurgitation  is mild.   TEE:n/a  CARDIAC CATH: n/a  STS RISK CALCULATOR: pending  NHYA CLASS: 2    ASSESSMENT AND PLAN:   No diagnosis found.  I have viewed the patient's echocardiogram.  It is unclear to me exactly how severe her mitral regurgitation is especially since seems to be mostly in systole for a brief duration.  She certainly has some degree of atrial functional mitral regurgitation and her left atrium is relatively larger.  I think for now the best thing is to have her undergo her ablation procedure.  I will see her back in 3 to 4 months with a plan to obtain another echocardiogram.  It is possible that the atrial fibrillation ablation could result in less mitral regurgitation though I think this is unlikely given the fact her left atria is relatively large.  While she does have some chronic shortness of breath it is not lifestyle limiting.   For today I did discuss changing the patient from Coumadin to Xarelto.  She is  agreeable to this.  I will have her stop Coumadin today with no further doses and start Xarelto on Wednesday.  I have personally reviewed the patients imaging data as summarized above.  I have reviewed the natural history of mitral regurgitation with the patient and family members who are present today. We have discussed the limitations of medical therapy and the poor prognosis associated with symptomatic mitral regurgitation. We have also reviewed potential treatment options, including palliative medical therapy, conventional mitral surgery, and transcatheter mitral edge-to-edge repair. We discussed treatment options in the context of this patient's specific comorbid medical conditions.   All of the patient's questions were answered today. Will make further recommendations based on the results of studies outlined above.   Total time spent with patient today 60 minutes. This includes reviewing records, evaluating the patient and coordinating care.   Orbie Pyo, MD  06/28/2023 12:43 PM    [facility] [address] [place], [place], [place]  [address] Phone: [phone]; Fax: [phone]

## 2023-06-29 ENCOUNTER — Ambulatory Visit: Payer: BC Managed Care – PPO | Attending: Internal Medicine | Admitting: Internal Medicine

## 2023-06-29 ENCOUNTER — Other Ambulatory Visit (HOSPITAL_COMMUNITY): Payer: Self-pay

## 2023-06-29 ENCOUNTER — Encounter: Payer: Self-pay | Admitting: Internal Medicine

## 2023-06-29 VITALS — BP 140/82 | HR 48 | Ht 67.5 in | Wt 241.0 lb

## 2023-06-29 DIAGNOSIS — I340 Nonrheumatic mitral (valve) insufficiency: Secondary | ICD-10-CM | POA: Diagnosis not present

## 2023-06-29 DIAGNOSIS — D6869 Other thrombophilia: Secondary | ICD-10-CM | POA: Diagnosis not present

## 2023-06-29 DIAGNOSIS — E1159 Type 2 diabetes mellitus with other circulatory complications: Secondary | ICD-10-CM

## 2023-06-29 DIAGNOSIS — N1831 Chronic kidney disease, stage 3a: Secondary | ICD-10-CM

## 2023-06-29 DIAGNOSIS — I152 Hypertension secondary to endocrine disorders: Secondary | ICD-10-CM

## 2023-06-29 DIAGNOSIS — R0683 Snoring: Secondary | ICD-10-CM

## 2023-06-29 DIAGNOSIS — Z6836 Body mass index (BMI) 36.0-36.9, adult: Secondary | ICD-10-CM | POA: Diagnosis not present

## 2023-06-29 DIAGNOSIS — I4819 Other persistent atrial fibrillation: Secondary | ICD-10-CM | POA: Diagnosis not present

## 2023-06-29 DIAGNOSIS — E785 Hyperlipidemia, unspecified: Secondary | ICD-10-CM

## 2023-06-29 DIAGNOSIS — I700 Atherosclerosis of aorta: Secondary | ICD-10-CM

## 2023-06-29 DIAGNOSIS — E1169 Type 2 diabetes mellitus with other specified complication: Secondary | ICD-10-CM

## 2023-06-29 DIAGNOSIS — E118 Type 2 diabetes mellitus with unspecified complications: Secondary | ICD-10-CM

## 2023-06-29 MED ORDER — SPIRONOLACTONE 25 MG PO TABS: 25.0000 mg | ORAL_TABLET | Freq: Every day | ORAL | 3 refills | Status: DC

## 2023-06-29 MED ORDER — LOSARTAN POTASSIUM 25 MG PO TABS: 25.0000 mg | ORAL_TABLET | Freq: Every day | ORAL | 3 refills | Status: DC

## 2023-06-29 MED ORDER — FUROSEMIDE 20 MG PO TABS: 20.0000 mg | ORAL_TABLET | Freq: Two times a day (BID) | ORAL | 3 refills | Status: DC

## 2023-06-29 MED ORDER — METOPROLOL SUCCINATE ER 25 MG PO TB24: 12.5000 mg | ORAL_TABLET | Freq: Every day | ORAL | 3 refills | Status: DC

## 2023-06-29 MED ORDER — EMPAGLIFLOZIN 10 MG PO TABS: 10.0000 mg | ORAL_TABLET | Freq: Every day | ORAL | 3 refills | Status: DC

## 2023-06-29 NOTE — Patient Instructions (Signed)
Medication Instructions:  Your physician has recommended you make the following change in your medication:  1.) change furosemide (Lasix) 20 mg - take one tablet daily 2.) start Jardiance 10 mg - take one tablet daily 3.) start losartan 25 mg - take one tablet daily at bedtime 4.) start spironolactone 25 mg - take one tablet daily at bedtime 5.) start metoprolol succinate (Toprol XL) 25 - take HALF (12.5) tablet daily at bedtime 6.) STOP DILTIAZEM *If you need a refill on your cardiac medications before your next appointment, please call your pharmacy*   Lab Work: Please return in 7-10 days for blood work (cbc, bmet)  If you have labs (blood work) drawn today and your tests are completely normal, you will receive your results only by: [employer] (if you have MyChart) OR A paper copy in the mail If you have any lab test that is abnormal or we need to change your treatment, we will call you to review the results.   Testing/Procedures: HOME STUDY Your physician has recommended that you have a sleep study. This test records several body functions during sleep, including: brain activity, eye movement, oxygen and carbon dioxide blood levels, heart rate and rhythm, breathing rate and rhythm, the flow of air through your mouth and nose, snoring, body muscle movements, and chest and belly movement.    Follow-Up: At [facility], you and your health needs are our priority.  As part of our continuing mission to provide you with exceptional heart care, we have created designated Provider Care Teams.  These Care Teams include your primary Cardiologist (physician) and Advanced Practice Providers (APPs -  Physician Assistants and Nurse Practitioners) who all work together to provide you with the care you need, when you need it.   Your next appointment:   6 month(s)  Provider:   Alverda Skeans, MD    Other Instructions You have been referred to our clinical pharmacy team for elevated  BMI/weight management

## 2023-06-30 ENCOUNTER — Telehealth: Payer: Self-pay | Admitting: Pharmacy Technician

## 2023-06-30 ENCOUNTER — Other Ambulatory Visit (HOSPITAL_COMMUNITY): Payer: Self-pay

## 2023-06-30 NOTE — Telephone Encounter (Signed)
Pharmacy Patient Advocate Encounter  Received notification from [facility] that Prior Authorization for jardiance has been APPROVED from 05/31/23 to 06/29/24   PA #/Case ID/Reference #: [id number]

## 2023-07-01 ENCOUNTER — Other Ambulatory Visit: Payer: Self-pay | Admitting: Internal Medicine

## 2023-07-01 ENCOUNTER — Ambulatory Visit: Payer: Self-pay | Admitting: Internal Medicine

## 2023-07-01 ENCOUNTER — Telehealth: Payer: Self-pay | Admitting: *Deleted

## 2023-07-01 DIAGNOSIS — I340 Nonrheumatic mitral (valve) insufficiency: Secondary | ICD-10-CM

## 2023-07-01 NOTE — Telephone Encounter (Signed)
Called patient and spoke w her re: TEE. Scheduled for 07/13/23 with Dr. Eden Emms, 12:00.  Instructions reviewed with patient and will send all info through MyChart.  Pt appreciative for assistance.  Her labs will be completed next week, her EKG and H&P are up to date.

## 2023-07-08 ENCOUNTER — Ambulatory Visit: Payer: BC Managed Care – PPO | Attending: Internal Medicine

## 2023-07-08 DIAGNOSIS — R0683 Snoring: Secondary | ICD-10-CM

## 2023-07-08 DIAGNOSIS — I4819 Other persistent atrial fibrillation: Secondary | ICD-10-CM | POA: Diagnosis not present

## 2023-07-08 DIAGNOSIS — E1159 Type 2 diabetes mellitus with other circulatory complications: Secondary | ICD-10-CM

## 2023-07-08 DIAGNOSIS — I340 Nonrheumatic mitral (valve) insufficiency: Secondary | ICD-10-CM | POA: Diagnosis not present

## 2023-07-08 DIAGNOSIS — Z6836 Body mass index (BMI) 36.0-36.9, adult: Secondary | ICD-10-CM | POA: Diagnosis not present

## 2023-07-08 DIAGNOSIS — I700 Atherosclerosis of aorta: Secondary | ICD-10-CM

## 2023-07-08 DIAGNOSIS — D6869 Other thrombophilia: Secondary | ICD-10-CM | POA: Diagnosis not present

## 2023-07-08 DIAGNOSIS — E118 Type 2 diabetes mellitus with unspecified complications: Secondary | ICD-10-CM

## 2023-07-08 DIAGNOSIS — N1831 Chronic kidney disease, stage 3a: Secondary | ICD-10-CM

## 2023-07-08 DIAGNOSIS — E1169 Type 2 diabetes mellitus with other specified complication: Secondary | ICD-10-CM

## 2023-07-09 LAB — BASIC METABOLIC PANEL
BUN/Creatinine Ratio: 32 — ABNORMAL HIGH (ref 12–28)
BUN: 33 mg/dL — ABNORMAL HIGH (ref 8–27)
CO2: 18 mmol/L — ABNORMAL LOW (ref 20–29)
Calcium: 9.6 mg/dL (ref 8.7–10.3)
Chloride: 104 mmol/L (ref 96–106)
Creatinine, Ser: 1.04 mg/dL — ABNORMAL HIGH (ref 0.57–1.00)
Glucose: 86 mg/dL (ref 70–99)
Potassium: 5.1 mmol/L (ref 3.5–5.2)
Sodium: 136 mmol/L (ref 134–144)
eGFR: 59 mL/min/{1.73_m2} — ABNORMAL LOW (ref >59)

## 2023-07-09 LAB — CBC
Hematocrit: 34.9 % (ref 34.0–46.6)
Hemoglobin: 11 g/dL — ABNORMAL LOW (ref 11.1–15.9)
MCH: 28.1 pg (ref 26.6–33.0)
MCHC: 31.5 g/dL (ref 31.5–35.7)
MCV: 89 fL (ref 79–97)
Platelets: 270 10*3/uL (ref 150–450)
RBC: 3.92 x10E6/uL (ref 3.77–5.28)
RDW: 13.2 % (ref 11.7–15.4)
WBC: 5.1 10*3/uL (ref 3.4–10.8)

## 2023-07-10 ENCOUNTER — Telehealth: Payer: Self-pay | Admitting: Cardiovascular Disease

## 2023-07-10 NOTE — Telephone Encounter (Signed)
Patient wants a call back to confirm which medications she can take prior to surgery on Monday.

## 2023-07-10 NOTE — Progress Notes (Signed)
Spoke to pt and instructed them to come at 1045 and to be NPO after 0000. Confirmed no missed doses of AC and instructed to take in AM with a small sip of water.   Confirmed that pt will have a ride home and someone to stay with them for 24 hours after the procedure.

## 2023-07-10 NOTE — Telephone Encounter (Signed)
Replied to patient's mychart message about same question.

## 2023-07-10 NOTE — Telephone Encounter (Signed)
Patient is calling in reference to phone encounter from 09/18. Patient is needing to clarify which medication she can and cannot take prior to her procedure. Patient is requesting a call back, but in case she misses our call she would like [place] to send a MyChart message. Please advise.

## 2023-07-10 NOTE — Telephone Encounter (Signed)
Left detailed message that she is to hold Jardiance starting today, through procedure day, and to not miss any doses of the Xarelto.  Adv to call back with further questions.

## 2023-07-13 ENCOUNTER — Ambulatory Visit (HOSPITAL_COMMUNITY): Payer: BC Managed Care – PPO | Admitting: Anesthesiology

## 2023-07-13 ENCOUNTER — Encounter (HOSPITAL_COMMUNITY)
Admission: RE | Disposition: A | Discharge status: Home / Self Care | Payer: Self-pay | Source: Home / Self Care | Attending: Cardiovascular Disease

## 2023-07-13 ENCOUNTER — Ambulatory Visit (HOSPITAL_BASED_OUTPATIENT_CLINIC_OR_DEPARTMENT_OTHER)
Admission: RE | Admit: 2023-07-13 | Discharge: 2023-07-13 | Disposition: A | Discharge status: Home / Self Care | Payer: BC Managed Care – PPO | Source: Ambulatory Visit | Attending: Internal Medicine | Admitting: Internal Medicine

## 2023-07-13 ENCOUNTER — Other Ambulatory Visit: Payer: Self-pay

## 2023-07-13 ENCOUNTER — Ambulatory Visit (HOSPITAL_COMMUNITY)
Admission: RE | Admit: 2023-07-13 | Discharge: 2023-07-13 | Disposition: A | Discharge status: Home / Self Care | Payer: BC Managed Care – PPO | Attending: Cardiovascular Disease | Admitting: Cardiovascular Disease

## 2023-07-13 DIAGNOSIS — Z6836 Body mass index (BMI) 36.0-36.9, adult: Secondary | ICD-10-CM | POA: Insufficient documentation

## 2023-07-13 DIAGNOSIS — I080 Rheumatic disorders of both mitral and aortic valves: Secondary | ICD-10-CM | POA: Diagnosis not present

## 2023-07-13 DIAGNOSIS — I129 Hypertensive chronic kidney disease with stage 1 through stage 4 chronic kidney disease, or unspecified chronic kidney disease: Secondary | ICD-10-CM | POA: Insufficient documentation

## 2023-07-13 DIAGNOSIS — R0683 Snoring: Secondary | ICD-10-CM | POA: Insufficient documentation

## 2023-07-13 DIAGNOSIS — I340 Nonrheumatic mitral (valve) insufficiency: Secondary | ICD-10-CM | POA: Diagnosis not present

## 2023-07-13 DIAGNOSIS — E785 Hyperlipidemia, unspecified: Secondary | ICD-10-CM | POA: Insufficient documentation

## 2023-07-13 DIAGNOSIS — I10 Essential (primary) hypertension: Secondary | ICD-10-CM | POA: Diagnosis not present

## 2023-07-13 DIAGNOSIS — Z79899 Other long term (current) drug therapy: Secondary | ICD-10-CM | POA: Insufficient documentation

## 2023-07-13 DIAGNOSIS — J4521 Mild intermittent asthma with (acute) exacerbation: Secondary | ICD-10-CM | POA: Diagnosis not present

## 2023-07-13 DIAGNOSIS — N1831 Chronic kidney disease, stage 3a: Secondary | ICD-10-CM | POA: Insufficient documentation

## 2023-07-13 DIAGNOSIS — E1122 Type 2 diabetes mellitus with diabetic chronic kidney disease: Secondary | ICD-10-CM | POA: Diagnosis not present

## 2023-07-13 DIAGNOSIS — D509 Iron deficiency anemia, unspecified: Secondary | ICD-10-CM | POA: Insufficient documentation

## 2023-07-13 DIAGNOSIS — I4819 Other persistent atrial fibrillation: Secondary | ICD-10-CM | POA: Diagnosis not present

## 2023-07-13 DIAGNOSIS — I700 Atherosclerosis of aorta: Secondary | ICD-10-CM | POA: Insufficient documentation

## 2023-07-13 DIAGNOSIS — Z7901 Long term (current) use of anticoagulants: Secondary | ICD-10-CM | POA: Insufficient documentation

## 2023-07-13 DIAGNOSIS — D6869 Other thrombophilia: Secondary | ICD-10-CM | POA: Diagnosis not present

## 2023-07-13 HISTORY — PX: TEE WITHOUT CARDIOVERSION: SHX5443

## 2023-07-13 LAB — ECHO TEE
MV M vel: 7.46 m/s
MV Peak grad: 222.6 mm[Hg]
Radius: 0.5 cm

## 2023-07-13 SURGERY — ECHOCARDIOGRAM, TRANSESOPHAGEAL
Anesthesia: Monitor Anesthesia Care

## 2023-07-13 MED ORDER — LIDOCAINE HCL (CARDIAC) PF 50 MG/5ML IV SOSY
PREFILLED_SYRINGE | INTRAVENOUS | Status: DC | PRN
Start: 2023-07-13 — End: 2023-07-13
  Administered 2023-07-13: 100 mg via INTRAVENOUS

## 2023-07-13 MED ORDER — SODIUM CHLORIDE 0.9 % IV SOLN: INTRAVENOUS | Status: DC

## 2023-07-13 MED ORDER — GLYCOPYRROLATE PF 0.2 MG/ML IJ SOSY
PREFILLED_SYRINGE | INTRAMUSCULAR | Status: DC | PRN
Start: 2023-07-13 — End: 2023-07-13
  Administered 2023-07-13: .2 mg via INTRAVENOUS

## 2023-07-13 MED ORDER — PROPOFOL 10 MG/ML IV BOLUS
INTRAVENOUS | Status: DC | PRN
Start: 2023-07-13 — End: 2023-07-13
  Administered 2023-07-13 (×3): 20 mg via INTRAVENOUS
  Administered 2023-07-13 (×2): 50 mg via INTRAVENOUS
  Administered 2023-07-13: 40 mg via INTRAVENOUS

## 2023-07-13 NOTE — Anesthesia Postprocedure Evaluation (Signed)
Anesthesia Post Note  Patient: Stacy Chapman  Procedure(s) Performed: TRANSESOPHAGEAL ECHOCARDIOGRAM     Patient location during evaluation: PACU Anesthesia Type: MAC Level of consciousness: awake and alert Pain management: pain level controlled Vital Signs Assessment: post-procedure vital signs reviewed and stable Respiratory status: spontaneous breathing, nonlabored ventilation and respiratory function stable Cardiovascular status: stable and blood pressure returned to baseline Anesthetic complications: no   No notable events documented.  Last Vitals:  Vitals:   07/13/23 1215 07/13/23 1220  BP: 126/67 (!) 155/77  Pulse: 72 72  Resp: 15 15  Temp:  (!) 36.1 C  SpO2: 100% 100%    Last Pain:  Vitals:   07/13/23 1215  TempSrc:   PainSc: 0-No pain                 Beryle Lathe

## 2023-07-13 NOTE — Interval H&P Note (Signed)
History and Physical Interval Note:  07/13/2023 11:39 AM  Stacy Chapman  has presented today for surgery, with the diagnosis of MR.  The various methods of treatment have been discussed with the patient and family. After consideration of risks, benefits and other options for treatment, the patient has consented to  Procedure(s): TRANSESOPHAGEAL ECHOCARDIOGRAM (N/A) as a surgical intervention.  The patient's history has been reviewed, patient examined, no change in status, stable for surgery.  I have reviewed the patient's chart and labs.  Questions were answered to the patient's satisfaction.     Charlton Haws

## 2023-07-13 NOTE — Anesthesia Preprocedure Evaluation (Addendum)
Anesthesia Evaluation  Patient identified by MRN, date of birth, ID band Patient awake    Reviewed: Allergy & Precautions, NPO status , Patient's Chart, lab work & pertinent test results, reviewed documented beta blocker date and time   History of Anesthesia Complications Negative for: history of anesthetic complications  Airway Mallampati: II  TM Distance: >3 FB Neck ROM: Full    Dental  (+) Dental Advisory Given, Partial Upper, Chipped   Pulmonary asthma    Pulmonary exam normal        Cardiovascular hypertension, Pt. on medications and Pt. on home beta blockers Normal cardiovascular exam+ dysrhythmias Atrial Fibrillation + Valvular Problems/Murmurs MR    '24 TTE - EF 55 to 60%. Left atrial size was mildly dilated. Mild mitral valve regurgitation. Aortic valve regurgitation is trivial.     Neuro/Psych TIA negative psych ROS   GI/Hepatic negative GI ROS, Neg liver ROS,,,  Endo/Other  diabetes, Type 2, Oral Hypoglycemic Agents  Morbid obesity  Renal/GU negative Renal ROS     Musculoskeletal  (+) Arthritis ,    Abdominal   Peds  Hematology  (+) Blood dyscrasia, anemia  On xarelto    Anesthesia Other Findings Hearing loss   Reproductive/Obstetrics                             Anesthesia Physical Anesthesia Plan  ASA: 3  Anesthesia Plan: MAC   Post-op Pain Management: Minimal or no pain anticipated   Induction:   PONV Risk Score and Plan: 2 and Propofol infusion and Treatment may vary due to age or medical condition  Airway Management Planned: Nasal Cannula and Natural Airway  Additional Equipment: None  Intra-op Plan:   Post-operative Plan:   Informed Consent: I have reviewed the patients History and Physical, chart, labs and discussed the procedure including the risks, benefits and alternatives for the proposed anesthesia with the patient or authorized representative who has  indicated his/her understanding and acceptance.       Plan Discussed with: CRNA and Anesthesiologist  Anesthesia Plan Comments:        Anesthesia Quick Evaluation

## 2023-07-13 NOTE — Transfer of Care (Signed)
Immediate Anesthesia Transfer of Care Note  Patient: Stacy Chapman  Procedure(s) Performed: TRANSESOPHAGEAL ECHOCARDIOGRAM  Patient Location: Cath Lab  Anesthesia Type:MAC  Level of Consciousness: awake, alert , and oriented  Airway & Oxygen Therapy: Patient Spontanous Breathing  Post-op Assessment: Report given to RN and Post -op Vital signs reviewed and stable  Post vital signs: Reviewed and stable  Last Vitals:  Vitals Value Taken Time  BP 127/74 07/13/23 1206  Temp 36.4 C 07/13/23 1206  Pulse 76 07/13/23 1207  Resp 22 07/13/23 1208  SpO2 99 % 07/13/23 1207  Vitals shown include unfiled device data.  Last Pain:  Vitals:   07/13/23 1206  TempSrc: Temporal  PainSc: 0-No pain         Complications: No notable events documented.

## 2023-07-13 NOTE — Progress Notes (Signed)
  Echocardiogram Echocardiogram Transesophageal has been performed.  Delcie Roch 07/13/2023, 12:21 PM

## 2023-07-13 NOTE — Discharge Instructions (Signed)
Transesophageal Echocardiogram Transesophageal echocardiogram (TEE) is a test that uses sound waves to take pictures of your heart. TEE is done by passing a small probe attached to a flexible tube down the part of the body that moves food from your mouth to your stomach (esophagus). The pictures give clear images of your heart. This can help your doctor see if there are problems with your heart. Tell a doctor about: Any allergies you have. All medicines you are taking. This includes vitamins, herbs, eye drops, creams, and over-the-counter medicines. Any problems you or family members have had with anesthetic medicines. Any blood disorders you have. Any surgeries you have had. Any medical conditions you have. Any swallowing problems. Whether you have or have had a blockage in the part of the body that moves food from your mouth to your stomach. Whether you are pregnant or may be pregnant. What are the risks? In general, this is a safe procedure. But, problems may occur, such as: Damage to nearby structures or organs. A tear in the part of the body that moves food from your mouth to your stomach. Irregular heartbeat. Hoarse voice or trouble swallowing. Bleeding. What happens before the procedure? Medicines Ask your doctor about changing or stopping: Your normal medicines. Vitamins, herbs, and supplements. Over-the-counter medicines. Do not take aspirin or ibuprofen unless you are told to. General instructions Follow instructions from your doctor about what you cannot eat or drink. You will take out any dentures or dental retainers. Plan to have a responsible adult take you home from the hospital or clinic. Plan to have a responsible adult care for you for the time you are told after you leave the hospital or clinic. This is important. What happens during the procedure?  An IV will be put into one of your veins. You may be given: A sedative. This medicine helps you relax. A medicine  to numb the back of your throat. This may be sprayed or gargled. Your blood pressure, heart rate, and breathing will be watched. You may be asked to lie on your left side. A bite block will be placed in your mouth. This keeps you from biting the tube. The tip of the probe will be placed into the back of your mouth. You will be asked to swallow. Your doctor will take pictures of your heart. The probe and bite block will be taken out after the test is done. The procedure may vary among doctors and hospitals. What can I expect after the procedure? You will be monitored until you leave the hospital or clinic. This includes checking your blood pressure, heart rate, breathing rate, and blood oxygen level. Your throat may feel sore and numb. This will get better over time. You will not be allowed to eat or drink until the numbness has gone away. It is common to have a sore throat for a day or two. It is up to you to get the results of your procedure. Ask how to get your results when they are ready. Follow these instructions at home: If you were given a sedative during your procedure, do not drive or use machines until your doctor says that it is safe. Return to your normal activities when your doctor says that it is safe. Keep all follow-up visits. Summary TEE is a test that uses sound waves to take pictures of your heart. You will be given a medicine to help you relax. Do not drive or use machines until your doctor says that it  is safe. This information is not intended to replace advice given to you by your health care provider. Make sure you discuss any questions you have with your health care provider. Document Revised: 06/12/2021 Document Reviewed: 05/22/2020 Elsevier Patient Education  2024 [employer].

## 2023-07-13 NOTE — CV Procedure (Signed)
TEE: Anesthesia: Propofol  EF 55-60% No LAA thrombus Mild LAE Moderate MR see full report in syngo.  Mild TR Trivial AR  Normal RV No ASD/PFO No effusion  Charlton Haws MD [facility]

## 2023-07-14 ENCOUNTER — Ambulatory Visit: Payer: BC Managed Care – PPO | Attending: Cardiovascular Disease | Admitting: Cardiovascular Disease

## 2023-07-14 ENCOUNTER — Encounter (HOSPITAL_COMMUNITY): Payer: Self-pay | Admitting: Cardiovascular Disease

## 2023-07-14 ENCOUNTER — Telehealth: Payer: Self-pay | Admitting: *Deleted

## 2023-07-14 VITALS — BP 140/76 | HR 84 | Ht 67.5 in | Wt 242.2 lb

## 2023-07-14 DIAGNOSIS — I4819 Other persistent atrial fibrillation: Secondary | ICD-10-CM | POA: Diagnosis not present

## 2023-07-14 DIAGNOSIS — I495 Sick sinus syndrome: Secondary | ICD-10-CM

## 2023-07-14 NOTE — Telephone Encounter (Signed)
Patient agreement reviewed and signed on 07/14/2023.  WatchPAT issued to patient on 07/14/2023 by Dennis Bast, RN. Patient aware to not open the WatchPAT box until contacted with the activation PIN. Patient profile initialized in CloudPAT on 07/14/2023 by Dennis Bast, RN. Device serial number: [id number]  Please list Reason for Call as Advice Only and type "WatchPAT issued to patient" in the comment box.

## 2023-07-14 NOTE — Patient Instructions (Addendum)
Medication Instructions:  STOP flecainide  *If you need a refill on your cardiac medications before your next appointment, please call your pharmacy*  Testing: [place] Sleep Study  Your physician has recommended that you have a sleep study. This test records several body functions during sleep, including: brain activity, eye movement, oxygen and carbon dioxide blood levels, heart rate and rhythm, breathing rate and rhythm, the flow of air through your mouth and nose, snoring, body muscle movements, and chest and belly movement.  Follow-Up: At [facility], you and your health needs are our priority.  As part of our continuing mission to provide you with exceptional heart care, we have created designated Provider Care Teams.  These Care Teams include your primary Cardiologist (physician) and Advanced Practice Providers (APPs -  Physician Assistants and Nurse Practitioners) who all work together to provide you with the care you need, when you need it.  We recommend signing up for the patient portal called "MyChart".  Sign up information is provided on this After Visit Summary.  MyChart is used to connect with patients for Virtual Visits (Telemedicine).  Patients are able to view lab/test results, encounter notes, upcoming appointments, etc.  Non-urgent messages can be sent to your provider as well.   To learn more about what you can do with MyChart, go to [email or web address].    Your next appointment:   6 month(s)  Provider:   You will see one of the following Advanced Practice Providers on your designated Care Team:   Francis Dowse, Charlott Holler [address]" [place], [place] Sherie Don, NP Canary Brim, NP

## 2023-07-14 NOTE — Progress Notes (Signed)
Electrophysiology Office Note:    Date:  07/14/2023   ID:  Stacy Chapman, DOB 1955-12-14, MRN [id number]  PCP:  Anne Ng, NP   [facility] Providers Cardiologist:  Kristeen Miss, MD     Referring MD: Anne Ng, NP   History of Present Illness:    Stacy Chapman is a 67 y.o. female with a hx listed below, significant for atrial fibrillation, TIA, referred for arrhythmia management.  She reports that she was initially diagnosed with atrial fibrillation years ago.  In November 2020, she presented to the ER with palpitations and found to be in A-fib.  Xarelto was started at that time.  She was admitted for syncope in December 2023.  She was noted to be in and out of A-fib with occasional posttermination pauses of up to 4 seconds.  She was also suffering influenza.  She had been out of diltiazem and taking only 1 dose of flecainide daily.  She confesses today that she has been taking flecainide only once daily for years.  EF was 40 to 45%.  Her medications were resumed. A Zio patch was placed that showed 90% atrial fibrillation burden with frequent pauses, longest 8.5 seconds.  These pauses occurred during sleep, however.  She is in AF today but not experiencing any fatigue or palpitations today.  She has noted some "wooziness" --she describes this as dizziness when she turns her head.  He notes this has been worse since she started taking flecainide twice daily.      EKGs/Labs/Other Studies Reviewed Today:       EKG Interpretation Date/Time:  Tuesday July 14 2023 10:39:45 EDT Ventricular Rate:  84 PR Interval:  156 QRS Duration:  80 QT Interval:  360 QTC Calculation: 425 R Axis:   45  Text Interpretation: Normal sinus rhythm Normal ECG When compared with ECG of 29-Jun-2023 10:55, Premature atrial complexes are no longer Present Confirmed by York Pellant [phone]) on 07/14/2023 10:53:16 AM    Recent Labs: 10/09/2022: B Natriuretic  Peptide 512.3 10/11/2022: Magnesium 1.7 10/16/2022: ALT 22 07/08/2023: BUN 33; Creatinine, Ser 1.04; Hemoglobin 11.0; Platelets 270; Potassium 5.1; Sodium 136   TEE 04/09/2023 EF 55 to 60%.  Mitral regurgitation appears moderate.   Physical Exam:    VS:  BP (!) 140/76 (BP Location: Left Arm, Patient Position: Sitting, Cuff Size: Large)   Pulse 84   Ht 5' 7.5" (1.715 m)   Wt 242 lb 3.2 oz (109.9 kg)   SpO2 96%   BMI 37.37 kg/m     Wt Readings from Last 3 Encounters:  07/14/23 242 lb 3.2 oz (109.9 kg)  07/13/23 236 lb (107 kg)  06/29/23 241 lb (109.3 kg)     GEN: Well nourished, well developed in no acute distress CARDIAC: iRRR, no murmurs, rubs, gallops RESPIRATORY:  Normal work of breathing MUSCULOSKELETAL: no edema    ASSESSMENT & PLAN:    Atrial fibrillation:  mild symptoms in fib, but having dizziness with flecainide, and tachy-brady is an issues, particularly with post-conversion pauses.  S/p ablation for AF April 09, 2023 Maintaining sinus rhythm Discontinue flecainide  Tachy-brady syndrome:  pauses of up to 8.5 seconds at night.  No syncope other than one episodes that occurred when the patient was dehydrated and suffering from influenza. Ideally, we would manage with rhythm control.   Mitral regurgitation She underwent TEE yesterday, July 13, 2023.  MR now appears moderate  Nocturnal bradycardia Will refer for sleep study  Medication Adjustments/Labs and Tests Ordered: Current medicines are reviewed at length with the patient today.  Concerns regarding medicines are outlined above.  Orders Placed This Encounter  Procedures   EKG 12-Lead   No orders of the defined types were placed in this encounter.    Signed, Maurice Small, MD  07/14/2023 10:53 AM    [facility]

## 2023-07-17 ENCOUNTER — Encounter: Payer: Self-pay | Admitting: Nurse Practitioner

## 2023-07-17 ENCOUNTER — Ambulatory Visit (INDEPENDENT_AMBULATORY_CARE_PROVIDER_SITE_OTHER): Payer: BC Managed Care – PPO | Admitting: Nurse Practitioner

## 2023-07-17 VITALS — BP 124/70 | HR 70 | Temp 97.9°F | Ht 67.0 in | Wt 239.4 lb

## 2023-07-17 DIAGNOSIS — E1169 Type 2 diabetes mellitus with other specified complication: Secondary | ICD-10-CM | POA: Diagnosis not present

## 2023-07-17 DIAGNOSIS — Z780 Asymptomatic menopausal state: Secondary | ICD-10-CM | POA: Diagnosis not present

## 2023-07-17 DIAGNOSIS — Z23 Encounter for immunization: Secondary | ICD-10-CM | POA: Diagnosis not present

## 2023-07-17 LAB — POCT GLYCOSYLATED HEMOGLOBIN (HGB A1C): Hemoglobin A1C: 6 % — AB (ref 4.0–5.6)

## 2023-07-17 LAB — MICROALBUMIN / CREATININE URINE RATIO
Creatinine,U: 68.9 mg/dL
Microalb Creat Ratio: 1 mg/g (ref 0.0–30.0)
Microalb, Ur: <0.7 mg/dL (ref 0.0–1.9)

## 2023-07-17 NOTE — Patient Instructions (Addendum)
hgbA1c at 6%-controlled DIABETES Schedule appointment for annual DM eye exam Go to lab

## 2023-07-17 NOTE — Progress Notes (Unsigned)
Established Patient Visit  Patient: Stacy Chapman   DOB: Mar 03, 1956   67 y.o. Female  MRN: [id number] Visit Date: 07/21/2023  Subjective:    Chief Complaint  Patient presents with   Hyperlipidemia   Hypertension   Diabetes   HPI Type 2 diabetes mellitus with other specified complication, without long-term current use of insulin (HCC) Home glucose :97-103 Repeat hgbA1c: 6% Repeat Uacr: normal Controlled DIABETES Advised to schedule appointment for Dm eye exam  Reviewed medical, surgical, and social history today  Medications: Outpatient Medications Prior to Visit  Medication Sig   acetaminophen (TYLENOL) 500 MG tablet Take 1,000 mg by mouth every 8 (eight) hours as needed for moderate pain.   albuterol (VENTOLIN HFA) 108 (90 Base) MCG/ACT inhaler INHALE 1 TO 2 PUFFS INTO THE LUNGS EVERY 6 HOURS AS NEEDED FOR WHEEZING OR SHORTNESS OF BREATH   ascorbic acid (VITAMIN C) 500 MG tablet Take 500 mg by mouth daily.   Biotin 2.5 MG TABS Take 2.5 mg by mouth daily.   empagliflozin (JARDIANCE) 10 MG TABS tablet Take 1 tablet (10 mg total) by mouth daily before breakfast.   ferrous gluconate (FERGON) 324 MG tablet Take 324 mg by mouth daily with breakfast.   fluticasone-salmeterol (ADVAIR HFA) 115-21 MCG/ACT inhaler Inhale 2 puffs into the lungs 2 (two) times daily. (Patient taking differently: Inhale 2 puffs into the lungs in the morning.)   furosemide (LASIX) 20 MG tablet Take 1 tablet (20 mg total) by mouth 2 (two) times daily.   losartan (COZAAR) 25 MG tablet Take 1 tablet (25 mg total) by mouth daily.   magnesium gluconate (MAGONATE) 500 MG tablet Take 500 mg by mouth at bedtime.   metoprolol succinate (TOPROL XL) 25 MG 24 hr tablet Take 0.5 tablets (12.5 mg total) by mouth daily.   Multiple Vitamin (MULTIVITAMIN) capsule Take 1 capsule by mouth daily. One a day   pantoprazole (PROTONIX) 40 MG tablet TAKE 1 TABLET(40 MG) BY MOUTH DAILY   rivaroxaban (XARELTO) 20  MG TABS tablet Take 1 tablet (20 mg total) by mouth daily with supper.   rosuvastatin (CRESTOR) 10 MG tablet TAKE 1 TABLET(10 MG) BY MOUTH DAILY   spironolactone (ALDACTONE) 25 MG tablet Take 1 tablet (25 mg total) by mouth daily.   azelastine (ASTELIN) 0.1 % nasal spray Place 1 spray into both nostrils 2 (two) times daily. Use in each nostril as directed (Patient not taking: Reported on 07/17/2023)   [DISCONTINUED] benzonatate (TESSALON) 200 MG capsule Take 1 capsule (200 mg total) by mouth 2 (two) times daily as needed. (Patient not taking: Reported on 07/03/2023)   [DISCONTINUED] montelukast (SINGULAIR) 10 MG tablet TAKE 1 TABLET(10 MG) BY MOUTH AT BEDTIME (Patient not taking: Reported on 07/17/2023)   No facility-administered medications prior to visit.   Reviewed past medical and social history.   ROS per HPI above      Objective:  BP 124/70   Pulse 70   Temp 97.9 F (36.6 C)   Ht 5\' 7"  (1.702 m)   Wt 239 lb 6.4 oz (108.6 kg)   SpO2 98%   BMI 37.50 kg/m      Physical Exam Cardiovascular:     Rate and Rhythm: Normal rate and regular rhythm.     Pulses: Normal pulses.     Heart sounds: Normal heart sounds.  Pulmonary:     Effort: Pulmonary effort is normal.  Breath sounds: Normal breath sounds.  Neurological:     Mental Status: She is alert and oriented to person, place, and time.     Results for orders placed or performed in visit on 07/17/23  Microalbumin / creatinine urine ratio  Result Value Ref Range   Microalb, Ur <0.7 0.0 - 1.9 mg/dL   Creatinine,U 99.3 mg/dL   Microalb Creat Ratio 1.0 0.0 - 30.0 mg/g  POCT glycosylated hemoglobin (Hb A1C)  Result Value Ref Range   Hemoglobin A1C 6.0 (A) 4.0 - 5.6 %   HbA1c POC (<> result, manual entry)     HbA1c, POC (prediabetic range)     HbA1c, POC (controlled diabetic range)        Assessment & Plan:    Problem List Items Addressed This Visit     Type 2 diabetes mellitus with other specified complication,  without long-term current use of insulin (HCC) - Primary    Home glucose :97-103 Repeat hgbA1c: 6% Repeat Uacr: normal Controlled DIABETES Advised to schedule appointment for Dm eye exam      Relevant Orders   Microalbumin / creatinine urine ratio (Completed)   POCT glycosylated hemoglobin (Hb A1C) (Completed)   Other Visit Diagnoses     Immunization due       Relevant Orders   Flu Vaccine Trivalent High Dose (Fluad) (Completed)   Asymptomatic postmenopausal estrogen deficiency       Relevant Orders   DG Bone Density      Return in about 6 months (around 01/15/2024) for HTN, DM, hyperlipidemia (fasting).     Alysia Penna, NP

## 2023-07-19 NOTE — Progress Notes (Unsigned)
Patient ID: Vermel Cajina                 DOB: 20-May-1956                    MRN: [id number]     HPI: Stacy Chapman is a 67 y.o. female patient referred to pharmacy clinic by Dr. Lynnette Caffey to initiate GLP1-RA therapy. PMH is significant for type 2 diabetes mellitus, persistent atrial fibrillation, hyperlipidemia, elevated blood pressure, and obesity. Most recent BMI 37.50 kg/m2.  Current weight and BMI: 108.6 kg (07/19/2023) Current meds that affect weight: ****  Previously tried metformin?? 2018, stopped due to persistent cough? Insurance [employer] [place] recently approved (notice from [employer])  *** If diabetic and on insulin/sulfonylurea, can consider reducing dose to reduce risk of hypoglycemia  *** Follow-up visit  Assess % weight loss Assess adverse effects Missed doses  Diet:   Exercise:   Family History:   Social History:   Labs: Lab Results  Component Value Date   HGBA1C 6.0 (A) 07/17/2023    Wt Readings from Last 1 Encounters:  07/17/23 239 lb 6.4 oz (108.6 kg)    BP Readings from Last 1 Encounters:  07/17/23 124/70   Pulse Readings from Last 1 Encounters:  07/17/23 70       Component Value Date/Time   CHOL 163 01/15/2023 1200   TRIG 66.0 01/15/2023 1200   HDL 73.60 01/15/2023 1200   CHOLHDL 2 01/15/2023 1200   VLDL 13.2 01/15/2023 1200   LDLCALC 76 01/15/2023 1200    Past Medical History:  Diagnosis Date   Anemia    Arthritis    Asthma    Diabetes (HCC)    Diarrhea of presumed infectious origin 10/09/2022   Essential hypertension    Morbid obesity (HCC)    Persistent atrial fibrillation (HCC)    Stroke (HCC) 2018   TIA    Current Outpatient Medications on File Prior to Visit  Medication Sig Dispense Refill   acetaminophen (TYLENOL) 500 MG tablet Take 1,000 mg by mouth every 8 (eight) hours as needed for moderate pain.     albuterol (VENTOLIN HFA) 108 (90 Base) MCG/ACT inhaler INHALE 1 TO 2 PUFFS INTO THE LUNGS  EVERY 6 HOURS AS NEEDED FOR WHEEZING OR SHORTNESS OF BREATH 20.1 g 0   ascorbic acid (VITAMIN C) 500 MG tablet Take 500 mg by mouth daily.     azelastine (ASTELIN) 0.1 % nasal spray Place 1 spray into both nostrils 2 (two) times daily. Use in each nostril as directed (Patient not taking: Reported on 07/17/2023) 90 mL 0   Biotin 2.5 MG TABS Take 2.5 mg by mouth daily.     empagliflozin (JARDIANCE) 10 MG TABS tablet Take 1 tablet (10 mg total) by mouth daily before breakfast. 90 tablet 3   ferrous gluconate (FERGON) 324 MG tablet Take 324 mg by mouth daily with breakfast.     fluticasone-salmeterol (ADVAIR HFA) 115-21 MCG/ACT inhaler Inhale 2 puffs into the lungs 2 (two) times daily. (Patient taking differently: Inhale 2 puffs into the lungs in the morning.) 12 g 5   furosemide (LASIX) 20 MG tablet Take 1 tablet (20 mg total) by mouth 2 (two) times daily. 90 tablet 3   losartan (COZAAR) 25 MG tablet Take 1 tablet (25 mg total) by mouth daily. 90 tablet 3   magnesium gluconate (MAGONATE) 500 MG tablet Take 500 mg by mouth at bedtime.     metoprolol succinate (TOPROL  XL) 25 MG 24 hr tablet Take 0.5 tablets (12.5 mg total) by mouth daily. 45 tablet 3   montelukast (SINGULAIR) 10 MG tablet TAKE 1 TABLET(10 MG) BY MOUTH AT BEDTIME (Patient not taking: Reported on 07/17/2023) 90 tablet 0   Multiple Vitamin (MULTIVITAMIN) capsule Take 1 capsule by mouth daily. One a day     pantoprazole (PROTONIX) 40 MG tablet TAKE 1 TABLET(40 MG) BY MOUTH DAILY 90 tablet 1   rivaroxaban (XARELTO) 20 MG TABS tablet Take 1 tablet (20 mg total) by mouth daily with supper. 90 tablet 3   rosuvastatin (CRESTOR) 10 MG tablet TAKE 1 TABLET(10 MG) BY MOUTH DAILY 90 tablet 1   spironolactone (ALDACTONE) 25 MG tablet Take 1 tablet (25 mg total) by mouth daily. 90 tablet 3   No current facility-administered medications on file prior to visit.    Allergies  Allergen Reactions   Lisinopril Cough     Assessment/Plan:  1. Weight  loss - Patient has not met goal of at least 5% of body weight loss with comprehensive lifestyle modifications alone in the past 3-6 months. Pharmacotherapy is appropriate to pursue as augmentation. Will start ***. Confirmed patient not ***pregnant and no personal or family history of medullary thyroid carcinoma (MTC) or Multiple Endocrine Neoplasia syndrome type 2 (MEN 2). Injection technique reviewed at today's visit.  Advised patient on common side effects including nausea, diarrhea, dyspepsia, decreased appetite, and fatigue. Counseled patient on reducing meal size and how to titrate medication to minimize side effects. Counseled patient to call if intolerable side effects or if experiencing dehydration, abdominal pain, or dizziness. Patient will adhere to dietary modifications and will target at least 150 minutes of moderate intensity exercise weekly.   Follow up in 1 month via telephone for tolerability update and dose titration.

## 2023-07-20 ENCOUNTER — Ambulatory Visit: Payer: BC Managed Care – PPO | Attending: Cardiology | Admitting: Pharmacist

## 2023-07-20 VITALS — Wt 239.0 lb

## 2023-07-20 DIAGNOSIS — E669 Obesity, unspecified: Secondary | ICD-10-CM

## 2023-07-20 DIAGNOSIS — E1169 Type 2 diabetes mellitus with other specified complication: Secondary | ICD-10-CM

## 2023-07-20 NOTE — Patient Instructions (Addendum)
Call Aundra Millet, PharmD if you would like to start [facility] at #[phone]. You can send me a MyChart message as well.  [facility] This medication reduces your appetite and may make you feel fuller longer. It typically helps with ~15-18% weight loss. Stop eating when your body tells you that you are full. This will likely happen sooner than you are used to. Fried/greasy food and sweets may upset your stomach - minimize these as much as possible. Store your medication in the fridge until you are ready to use it. Inject your medication in the fatty tissue of your lower abdominal area (2 inches away from belly button) or upper outer thigh. Rotate injection sites. Common side effects include: nausea, diarrhea/constipation, and heartburn, and are more likely to occur if you overeat. Stop your injection for 7 days prior to surgical procedures requiring anesthesia.  Dosing schedule:  We will touch base with you monthly over the phone. The medication can be increased in monthly intervals depending on tolerability and efficacy.  Tips for success: Write down the reasons why you want to lose weight and post it in a place where you'll see it often.  Start small and work your way up. Keep in mind that it takes time to achieve goals, and small steps add up.  Any additional movements help to burn calories. Taking the stairs rather than the elevator and parking at the far end of your parking lot are easy ways to start. Brisk walking for at least 30 minutes 4 or more days of the week is an excellent goal to work toward  Understanding what it means to feel full: Did you know that it can take 15 minutes or more for your brain to receive the message that you've eaten? That means that, if you eat less food, but consume it slower, you may still feel satisfied.  Eating a lot of fruits and vegetables can also help you feel fuller.  Eat off of smaller plates so that moderate portions don't seem too  small  Tips for living a healthier life     Building a Healthy and Balanced Diet Make most of your meal vegetables and fruits -  of your plate. Aim for color and variety, and remember that potatoes don't count as vegetables on the Healthy Eating Plate because of their negative impact on blood sugar.  Go for whole grains -  of your plate. Whole and intact grains--whole wheat, barley, wheat berries, quinoa, oats, brown rice, and foods made with them, such as whole wheat pasta--have a milder effect on blood sugar and insulin than white bread, white rice, and other refined grains.  Protein power -  of your plate. Fish, poultry, beans, and nuts are all healthy, versatile protein sources--they can be mixed into salads, and pair well with vegetables on a plate. Limit red meat, and avoid processed meats such as bacon and sausage.  Healthy plant oils - in moderation. Choose healthy vegetable oils like olive, canola, soy, corn, sunflower, peanut, and others, and avoid partially hydrogenated oils, which contain unhealthy trans fats. Remember that low-fat does not mean "healthy."  Drink water, coffee, or tea. Skip sugary drinks, limit milk and dairy products to one to two servings per day, and limit juice to a small glass per day.  Stay active. The red figure running across the Healthy Eating Plate's placemat is a reminder that staying active is also important in weight control.  The main message of the Healthy Eating Plate is to  focus on diet quality:  The type of carbohydrate in the diet is more important than the amount of carbohydrate in the diet, because some sources of carbohydrate--like vegetables (other than potatoes), fruits, whole grains, and beans--are healthier than others. The Healthy Eating Plate also advises consumers to avoid sugary beverages, a major source of calories--usually with little nutritional value--in the American diet. The Healthy Eating Plate encourages consumers to  use healthy oils, and it does not set a maximum on the percentage of calories people should get each day from healthy sources of fat. In this way, the Healthy Eating Plate recommends the opposite of the low-fat message promoted for decades by the USDA.  [employer]  SUGAR  Sugar is a huge problem in the modern day diet. Sugar is a big contributor to heart disease, diabetes, high triglyceride levels, fatty liver disease and obesity. Sugar is hidden in almost all packaged foods/beverages. Added sugar is extra sugar that is added beyond what is naturally found and has no nutritional benefit for your body. The American Heart Association recommends limiting added sugars to no more than 25g for women and 36 grams for men per day. There are many names for sugar including maltose, sucrose (names ending in "ose"), high fructose corn syrup, molasses, cane sugar, corn sweetener, raw sugar, syrup, honey or fruit juice concentrate.   One of the best ways to limit your added sugars is to stop drinking sweetened beverages such as soda, sweet tea, and fruit juice.  There is 65g of added sugars in one 20oz bottle of Coke! That is equal to 7.5 donuts.   Pay attention and read all nutrition facts labels. Below is an examples of a nutrition facts label. The #1 is showing you the total sugars where the # 2 is showing you the added sugars. This one serving has almost the max amount of added sugars per day!   EXERCISE  Exercise is good. We've all heard that. In an ideal world, we would all have time and resources to get plenty of it. When you are active, your heart pumps more efficiently and you will feel better.  Multiple studies show that even walking regularly has benefits that include living a longer life. The American Heart Association recommends 150 minutes per week of exercise (30 minutes per day most days of the week). You can do this in any increment you  wish. Nine or more 10-minute walks count. So does an hour-long exercise class. Break the time apart into what will work in your life. Some of the best things you can do include walking briskly, jogging, cycling or swimming laps. Not everyone is ready to "exercise." Sometimes we need to start with just getting active. Here are some easy ways to be more active throughout the day:  Take the stairs instead of the elevator  Go for a 10-15 minute walk during your lunch break (find a friend to make it more enjoyable)  When shopping, park at the back of the parking lot  If you take public transportation, get off one stop early and walk the extra distance  Pace around while making phone calls  Check with your doctor if you aren't sure what your limitations may be. Always remember to drink plenty of water when doing any type of exercise. Don't feel like a failure if you're not getting the 90-150 minutes per week. If you started by being a couch potato, then just a 10-minute walk each day is a huge improvement. Start with  little victories and work your way up.   HEALTHY EATING TIPS              Plan ahead: make a menu of the meals for a week then create a grocery list to go with that menu. Consider meals that easily stretch into a night of leftovers, such as stews or casseroles. Or consider making two of your favorite meal and put one in the freezer for another night. Try a night or two each week that is "meatless" or "no cook" such as salads. When you get home from the grocery store wash and prepare your vegetables and fruits. Then when you need them they are ready to go.   Tips for going to the grocery store:  Buy store or generic brands  Check the weekly ad from your store on-line or in their in-store flyer  Look at the unit price on the shelf tag to compare/contrast the costs of different items  Buy fruits/vegetables in season  Carrots, bananas and apples are low-cost, naturally healthy items  If meats  or frozen vegetables are on sale, buy some extras and put in your freezer  Limit buying prepared or "ready to eat" items, even if they are pre-made salads or fruit snacks  Do not shop when you're hungry  Foods at eye level tend to be more expensive. Look on the high and low shelves for deals.  Consider shopping at the farmer's market for fresh foods in season.  Avoid the cookie and chip aisles (these are expensive, high in calories and low in nutritional value). Shop on the outside of the grocery store.  Healthy food preparations:  If you can't get lean hamburger, be sure to drain the fat when cooking  Steam, saut (in olive oil), grill or bake foods  Experiment with different seasonings to avoid adding salt to your foods. Kosher salt, sea salt and Himalayan salt are all still salt and should be avoided. Try seasoning food with onion, garlic, thyme, rosemary, basil ect. Onion powder or garlic powder is ok. Avoid if it says salt (ie garlic salt).

## 2023-07-21 NOTE — Assessment & Plan Note (Addendum)
Home glucose :97-103 Repeat hgbA1c: 6% Repeat Uacr: normal Controlled DIABETES Advised to schedule appointment for Dm eye exam

## 2023-07-23 NOTE — Telephone Encounter (Signed)
**Note De-Identified Alonda Weaber Obfuscation** Ordering provider: Dr Nelly Laurence Associated diagnoses: A-fib-I48.19 WatchPAT PA obtained on 07/23/2023 by Jenilyn Magana, Lorelle Formosa, LPN Eltha Tingley phone call with Madelon Lips at [employer] Reference #: [id number] PA REQ for CPT Code: 84696 Patient notified of PIN (1234) on 07/23/2023 Denee Boeder Notification Method: phone. I did request that she do her HST within 2 weeks, if possible and she stated that she will.  Phone note routed to covering staff for follow-up.

## 2023-08-05 ENCOUNTER — Encounter (INDEPENDENT_AMBULATORY_CARE_PROVIDER_SITE_OTHER): Payer: Self-pay | Admitting: Cardiology

## 2023-08-05 DIAGNOSIS — G4710 Hypersomnia, unspecified: Secondary | ICD-10-CM | POA: Diagnosis not present

## 2023-08-05 DIAGNOSIS — G4719 Other hypersomnia: Secondary | ICD-10-CM

## 2023-08-10 ENCOUNTER — Other Ambulatory Visit: Payer: Self-pay | Admitting: Nurse Practitioner

## 2023-08-10 DIAGNOSIS — J4521 Mild intermittent asthma with (acute) exacerbation: Secondary | ICD-10-CM

## 2023-08-11 ENCOUNTER — Ambulatory Visit: Payer: BC Managed Care – PPO | Attending: Cardiovascular Disease

## 2023-08-11 ENCOUNTER — Telehealth: Payer: Self-pay

## 2023-08-11 DIAGNOSIS — I495 Sick sinus syndrome: Secondary | ICD-10-CM

## 2023-08-11 DIAGNOSIS — I4819 Other persistent atrial fibrillation: Secondary | ICD-10-CM

## 2023-08-11 NOTE — Procedures (Signed)
SLEEP STUDY REPORT Patient Information Study Date: 08/05/2023 Patient Name: Stacy Chapman Patient ID: [id number] Birth Date: 1956-06-05 Age: 67 Gender: Female BMI: 38.1 (W=242 lb, H=5' 7'') Stopbang: 5 Referring Physician: York Pellant, MD  TEST DESCRIPTION: Home sleep apnea testing was completed using the WatchPat, a Type 1 device, utilizing peripheral arterial tonometry (PAT), chest movement, actigraphy, pulse oximetry, pulse rate, body position and snore. AHI was calculated with apnea and hypopnea using valid sleep time as the denominator. RDI includes apneas, hypopneas, and RERAs. The data acquired and the scoring of sleep and all associated events were performed in accordance with the recommended standards and specifications as outlined in the AASM Manual for the Scoring of Sleep and Associated Events 2.2.0 (2015).  FINDINGS: 1. No evidence of Obstructive Sleep Apnea with AHI 4.3/hr. 2. No Central Sleep Apnea. 3. Oxygen desaturations as low as 87%. 4. Moderate snoring was present. O2 sats were < 88% for 0 minutes. 5. Total sleep time was 4 hrs and 54 min. 6. 33.1% of total sleep time was spent in REM sleep. 7. Normal sleep onset latency at 25 min. 8. Prolonged REM sleep onset latency at 129 min. 9. Total awakenings were 4. 10. Arrhythmias: Possible atrial fibrillation lasting 4 minutes and 12 seconds. This is study is not diagnostic for atrial fibrillation and should be followed by further testing if clinically indicated.  DIAGNOSIS: Normal study with no significant sleep disordered breathing. Possible Atrial Fibrillation  RECOMMENDATIONS: 1. Normal study with no significant sleep disordered breathing.  2. Healthy sleep recommendations include: adequate nightly sleep (normal 7-9 hrs/night), avoidance of caffeine after noon and alcohol near bedtime, and maintaining a sleep environment that is cool, dark and quiet.  3. Weight loss for overweight patients is  recommended.  4. Snoring recommendations include: weight loss where appropriate, side sleeping, and avoidance of alcohol before bed.  5. Operation of motor vehicle or dangerous equipment must be avoided when feeling drowsy, excessively sleepy, or mentally fatigued.  6. An ENT consultation which may be useful for specific causes of and possible treatment of bothersome snoring .  7. Weight loss may be of benefit in reducing the severity of snoring.   Signature: Armanda Magic, MD; [facility]; Diplomat, American Board of Sleep Medicine Electronically Signed: 08/11/2023 5:12:03 AM

## 2023-08-11 NOTE — Telephone Encounter (Signed)
-----   Message from Stacy Chapman sent at 08/11/2023  5:13 AM EDT ----- Please let patient know that sleep study showed no significant sleep apnea.

## 2023-08-11 NOTE — Telephone Encounter (Signed)
Notified patient via VM, per DPR, of sleep study results and recommendations. Left callback number for questions or concerns.

## 2023-08-23 LAB — HM DIABETES EYE EXAM

## 2023-09-05 NOTE — Telephone Encounter (Signed)
Notes receieved and care team updated.

## 2023-09-08 ENCOUNTER — Other Ambulatory Visit: Payer: Self-pay | Admitting: Physician Assistant

## 2023-09-08 ENCOUNTER — Other Ambulatory Visit: Payer: Self-pay | Admitting: Nurse Practitioner

## 2023-09-08 DIAGNOSIS — E1169 Type 2 diabetes mellitus with other specified complication: Secondary | ICD-10-CM

## 2023-09-08 DIAGNOSIS — I10 Essential (primary) hypertension: Secondary | ICD-10-CM

## 2023-09-24 ENCOUNTER — Telehealth: Payer: Self-pay | Admitting: Cardiovascular Disease

## 2023-09-24 NOTE — Telephone Encounter (Signed)
New Message:     Synetta Fail from [facility] would like her name to be put in patient's chart. This is just in case patient need case management intervention.Marland Kitchen

## 2023-09-24 NOTE — Telephone Encounter (Signed)
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

## 2023-10-19 ENCOUNTER — Other Ambulatory Visit: Payer: Self-pay | Admitting: Nurse Practitioner

## 2023-10-19 DIAGNOSIS — J4521 Mild intermittent asthma with (acute) exacerbation: Secondary | ICD-10-CM

## 2023-10-20 ENCOUNTER — Other Ambulatory Visit: Payer: Self-pay | Admitting: Nurse Practitioner

## 2023-10-20 DIAGNOSIS — Z1231 Encounter for screening mammogram for malignant neoplasm of breast: Secondary | ICD-10-CM

## 2023-10-28 ENCOUNTER — Telehealth: Payer: Self-pay | Admitting: Internal Medicine

## 2023-10-28 NOTE — Telephone Encounter (Signed)
 Pt c/o medication issue:  1. Name of Medication:      empagliflozin  (JARDIANCE ) 10 MG TABS tablet    2. How are you currently taking this medication (dosage and times per day)? Take 1 tablet (10 mg total) by mouth daily before breakfast.   3. Are you having a reaction (difficulty breathing--STAT)? No  4. What is your medication issue? Needs renew with patient assistance, requesting cb

## 2023-10-29 ENCOUNTER — Telehealth: Payer: Self-pay | Admitting: Pharmacy Technician

## 2023-10-29 ENCOUNTER — Other Ambulatory Visit (HOSPITAL_COMMUNITY): Payer: Self-pay

## 2023-10-29 ENCOUNTER — Encounter: Payer: Self-pay | Admitting: Pharmacy Technician

## 2023-10-29 NOTE — Telephone Encounter (Signed)
Received message patient needed assistance with jardiance. I completed a coupon for the patient. Patient aware via mychart and phone call. I called the pharmacy and gave them the information and they applied the coupon.

## 2023-11-10 ENCOUNTER — Ambulatory Visit
Admission: RE | Admit: 2023-11-10 | Discharge: 2023-11-10 | Disposition: A | Discharge status: Home / Self Care | Payer: BC Managed Care – PPO | Source: Ambulatory Visit | Attending: Nurse Practitioner | Admitting: Nurse Practitioner

## 2023-11-10 DIAGNOSIS — Z1231 Encounter for screening mammogram for malignant neoplasm of breast: Secondary | ICD-10-CM

## 2023-11-27 ENCOUNTER — Telehealth: Payer: Self-pay | Admitting: Internal Medicine

## 2023-11-27 NOTE — Telephone Encounter (Signed)
Pt c/o medication issue:  1. Name of Medication:   empagliflozin (JARDIANCE) 10 MG TABS tablet   2. How are you currently taking this medication (dosage and times per day)?   As prescribed  3. Are you having a reaction (difficulty breathing--STAT)?   4. What is your medication issue?   Patient stated she will need assistance getting this medication and wants to complete form again to get this medication.  Patient stated she is now completely out of the medication and wants advice on next steps.

## 2023-11-27 NOTE — Telephone Encounter (Signed)
When she went to pick up the prescription this time it was $450.  I told her the copay card should be on her file and work.  I will forward it to her in the portal and she will take it in over the weekend if she is able.   She is aware I will ask the rx med assist pool for direction on this as well.

## 2023-11-30 ENCOUNTER — Telehealth: Payer: Self-pay | Admitting: Pharmacy Technician

## 2023-11-30 NOTE — Telephone Encounter (Addendum)
I called [facility] and they said they are running it on the insurance and the coupon and it looks to be having problems on their end she said. She said she was going to work on it throughout the day and will let me know.

## 2023-11-30 NOTE — Telephone Encounter (Signed)
[facility] called back and they were able to process it with the coupon for 10.00 and she said she called the patient to make her aware

## 2023-11-30 NOTE — Telephone Encounter (Signed)
Note [facility] called back and they were able to process it with the coupon for 10.00 and she said she called the patient to make her aware          11/30/23 11:16 AM You routed this conversation to Lendon Ka, RN Me     11/30/23 10:13 AM Note I called [facility] and they said they are running it on the insurance and the coupon and it looks to be having problems on their end she said. She said she was going to work on it throughout the day and will let me know.     November 27, 2023       11/27/23  5:46 PM Lendon Ka, RN routed this conversation to Rx Med Assistance Team  Me   Lendon Ka, RN     11/27/23  5:46 PM Note When she went to pick up the prescription this time it was $450.  I told her the copay card should be on her file and work.  I will forward it to her in the portal and she will take it in over the weekend if she is able.   She is aware I will ask the rx med assist pool for direction on this as well.

## 2023-12-01 ENCOUNTER — Telehealth: Payer: Self-pay | Admitting: Pharmacy Technician

## 2023-12-01 ENCOUNTER — Other Ambulatory Visit (HOSPITAL_COMMUNITY): Payer: Self-pay

## 2023-12-01 NOTE — Telephone Encounter (Addendum)
I called [facility] and spoke to the pharmacist and she said they spoke to the patient this morning and told her it was ready for her for 10.00. the patient then requested this to be transferred to the [facility] [address]. [facility] [address] had trouble filling the prescription on the coupon and so they transferred it back. That's when [facility] [address] had problems filling it again. I called the coupon and they said there is still a paid claim at [facility] [address] from today so that [facility] has to call and get them to reverse that paid claim. They have to call Pharmacy support at [phone]. I called [facility] on [address] back and left them a message to them they have to call, they would not allow me to because they are convinced the patient got the medication. [facility] closed at 6pm  As far as her being out since Saturday and what that will do, sending to a pharmacist for that answer.

## 2023-12-01 NOTE — Telephone Encounter (Signed)
Patient called back to say that once she got to the pharmacy they told her the coupon didn't work. So I personally called and they stated she needed a new copay card. Patient is worried what would happen to her, since she has had medication since Saturday. Please advise

## 2023-12-01 NOTE — Telephone Encounter (Signed)
I called [facility] and spoke to the pharmacist and she said they spoke to the patient this morning and told her it was ready for her for 10.00. the patient then requested this to be transferred to the [facility] [address]. [facility] [address] had trouble filling the prescription on the coupon and so they transferred it back. That's when [facility] [address] had problems filling it again. I called the coupon and they said there is still a paid claim at [facility] [address] from today so that [facility] has to call and get them to reverse that paid claim. They have to call Pharmacy support at [phone]. I called [facility] [address] back and left them a message to them they have to call, they would not allow me to because they are convinced the patient got the medication. [facility] closed at 6pm

## 2023-12-02 NOTE — Telephone Encounter (Signed)
Its ok. The medication is still working well in her body. If she is able to get the medication by Saturday, she can stay on her every other Saturday schedule.  Can we make sure this got resolved?

## 2023-12-10 NOTE — Progress Notes (Signed)
 Patient ID: Stacy Chapman MRN: [id number] DOB/AGE: 1956-06-20 68 y.o.  Primary Care Physician:Nche, Bonna Gains, NP Primary Cardiologist: Kristeen Miss, MD Referring Cardiologist: Halford Chessman, MD   FOCUSED CARDIOVASCULAR PROBLEM LIST:   AF CV 2 score 7 On Xarelto Tachybradycardia syndrome status post PVI June 2024 Iron deficiency anemia T2DM Diet controlled Intolerant of Jardiance due to diarrhea Hypertension Hyperlipidemia Aortic atherosclerosis CT abdomen pelvis 2023 CKD stage III AAA MR Moderate to severe MR, EF 50 to 55% TTE April 2024 Mild to moderate MR, EF 55 to 60%, TTE September 2024 Moderate, restricted posterior leaflet EF 55 to 60% TEE 2024  HISTORY OF PRESENT ILLNESS:  May 2024:  The patient is a 68 y.o. female with the indicated medical history here for recommendations regarding the patient's severe mitral regurgitation seen on recent echocardiogram.  The patient has been followed by electrophysiology department for some time.  She is initially diagnosed with atrial fibrillation in 2020.  She was started on Xarelto.  Later she was started on flecainide.  It looks like she was eventually changed to Coumadin due to financial issues with Xarelto.  She was seen by Dr. Hyacinth Meeker recently.  Monitor demonstrated fairly significant pauses and for this reason plan was conceived to pursue atrial fibrillation ablation.  An echocardiogram was performed which demonstrated significant mitral regurgitation different than her previous echocardiogram.  The patient works full-time at [employer].  She walks quite a bit every day.  She has not really noticed any increasing shortness of breath.  On occasion she will get short of breath when she goes to the bathroom.  She will stop and rest and then resume.  She has had no dizzy spells since seeing EP recently.  She has not had any bleeding issues while on Coumadin.  She is required no emergency room visits or hospitalizations.   In general she is able to do all of her activities of daily living.  Plan:  D/C Coumadin, start Xarelto, follow up after atrial fibrillation ablation.  Sept 2024:  The patient underwent atrial fibrillation ablation in June.  An echocardiogram in early September demonstrated preserved LV function with mitral regurgitation.  On my review it looks like the mitral regurgitation is only in the systolic phase and early in systole but it does reach all the way back to the pulmonary veins.  The patient tells me she was well up until earlier this month.  After the ablation she felt much better in terms of her breathing.  However over the last couple of weeks she has developed increasing shortness of breath with an NYHA class II symptoms.  She has not been taking her as needed Lasix.  She has noticed some mild peripheral edema.  She denies any exertional chest pain.  She is not short of breath at rest.  Plan: Start Toprol 12.5 mg at bedtime, losartan 25 mg, spironolactone 25 mg, Jardiance 10 mg, change Lasix to 20 mg daily, refer for sleep apnea evaluation and to pharmacy for GLP-1 receptor agonist therapy.  Refer for TEE.  March 2025:  Patient consents to use of AI scribe.  In the interim the patient had a TEE which demonstrated moderate mitral regurgitation due to restricted posterior leaflet.  Her sleep study did not show sleep apnea.  She was seen by EP and flecainide was discontinued.  Past Medical History:  Diagnosis Date   Anemia    Arthritis    Asthma    Diabetes (HCC)  Diarrhea of presumed infectious origin 10/09/2022   Essential hypertension    Morbid obesity (HCC)    Persistent atrial fibrillation (HCC)    Stroke (HCC) 2018   TIA    Past Surgical History:  Procedure Laterality Date   ATRIAL FIBRILLATION ABLATION N/A 04/08/2023   Procedure: ATRIAL FIBRILLATION ABLATION;  Surgeon: Maurice Small, MD;  Location: [facility] INVASIVE CV LAB;  Service: Cardiovascular;  Laterality: N/A;   KNEE  ARTHROSCOPY     TEE WITHOUT CARDIOVERSION N/A 07/13/2023   Procedure: TRANSESOPHAGEAL ECHOCARDIOGRAM;  Surgeon: Wendall Stade, MD;  Location: [facility] INVASIVE CV LAB;  Service: Cardiovascular;  Laterality: N/A;   TONSILLECTOMY     TUBAL LIGATION  1991    Family History  Problem Relation Age of Onset   Stroke Father 52       death   Hypertension Father    Dementia Sister    Diabetes Sister    Eczema Sister    Dementia Sister    Cancer Paternal Uncle        unknown   Hypertension Brother    Stroke Brother 20       death   Lung cancer Brother    Colon cancer Neg Hx    Colon polyps Neg Hx    Stomach cancer Neg Hx    Rectal cancer Neg Hx    Esophageal cancer Neg Hx    Breast cancer Neg Hx    BRCA 1/2 Neg Hx     Social History   Socioeconomic History   Marital status: Divorced    Spouse name: Not on file   Number of children: Not on file   Years of education: Not on file   Highest education level: Not on file  Occupational History   Not on file  Tobacco Use   Smoking status: Never   Smokeless tobacco: Never   Tobacco comments:    Never smoke 05/01/22  Vaping Use   Vaping status: Never Used  Substance and Sexual Activity   Alcohol use: Not Currently    Alcohol/week: 6.0 standard drinks of alcohol    Types: 1 Glasses of wine, 5 Standard drinks or equivalent per week    Comment: Drinks multiple times a week   Drug use: No   Sexual activity: Yes    Birth control/protection: Post-menopausal, Surgical    Comment: s/p tubal ligation  Other Topics Concern   Not on file  Social History Narrative   Lives in [place] and works in a [employer]   Social Drivers of Health   Financial Resource Strain: Low Risk  (01/15/2023)   Overall Financial Resource Strain (CARDIA)    Difficulty of Paying Living Expenses: Not hard at all  Food Insecurity: No Food Insecurity (01/15/2023)   Hunger Vital Sign    Worried About Running Out of Food in the Last Year: Never true    Ran Out of Food  in the Last Year: Never true  Transportation Needs: No Transportation Needs (10/09/2022)   PRAPARE - Administrator, Civil Service (Medical): No    Lack of Transportation (Non-Medical): No  Physical Activity: Sufficiently Active (01/15/2023)   Exercise Vital Sign    Days of Exercise per Week: 5 days    Minutes of Exercise per Session: 30 min  Stress: Stress Concern Present (01/15/2023)   [employer] of Occupational Health - Occupational Stress Questionnaire    Feeling of Stress : Rather much  Social Connections: Moderately Isolated (01/15/2023)   Social  Connection and Isolation Panel [NHANES]    Frequency of Communication with Friends and Family: More than three times a week    Frequency of Social Gatherings with Friends and Family: More than three times a week    Attends Religious Services: More than 4 times per year    Active Member of [employer] or Organizations: No    Attends Banker Meetings: Never    Marital Status: Divorced  Catering manager Violence: Not At Risk (10/09/2022)   Humiliation, Afraid, Rape, and Kick questionnaire    Fear of Current or Ex-Partner: No    Emotionally Abused: No    Physically Abused: No    Sexually Abused: No     Prior to Admission medications   Medication Sig Start Date End Date Taking? Authorizing Provider  acetaminophen (TYLENOL) 500 MG tablet Take 1,000 mg by mouth as needed for moderate pain.    [provider]  albuterol (VENTOLIN HFA) 108 (90 Base) MCG/ACT inhaler INHALE 1 TO 2 PUFFS INTO THE LUNGS EVERY 6 HOURS AS NEEDED FOR WHEEZING OR SHORTNESS OF BREATH 01/08/23   Nche, Bonna Gains, NP  azelastine (ASTELIN) 0.1 % nasal spray Place 1 spray into both nostrils 2 (two) times daily. Use in each nostril as directed 08/11/22   Nche, Bonna Gains, NP  benzonatate (TESSALON) 200 MG capsule Take 1 capsule (200 mg total) by mouth 2 (two) times daily as needed. 01/15/23   Nche, Bonna Gains, NP  Biotin 2.5 MG TABS Take 1  tablet by mouth daily.    [provider]  diltiazem (CARDIZEM CD) 120 MG 24 hr capsule TAKE 1 CAPSULE BY MOUTH DAILY 10/08/22   Fenton, Clint R, PA  famotidine (PEPCID) 20 MG tablet Take 1 tablet (20 mg total) by mouth 2 (two) times daily. 12/22/22   Nche, Bonna Gains, NP  ferrous gluconate (FERGON) 324 MG tablet Take 324 mg by mouth daily with breakfast.    [provider]  flecainide (TAMBOCOR) 50 MG tablet Take 1.5 tablets (75 mg total) by mouth 2 (two) times daily. 09/15/22   Fenton, Clint R, PA  fluticasone (FLONASE) 50 MCG/ACT nasal spray SHAKE LIQUID AND USE 2 SPRAYS IN EACH NOSTRIL DAILY 01/24/20   Nche, Bonna Gains, NP  fluticasone-salmeterol (ADVAIR HFA) 115-21 MCG/ACT inhaler Inhale 2 puffs into the lungs 2 (two) times daily. 01/29/23   Nche, Bonna Gains, NP  furosemide (LASIX) 20 MG tablet TAKE 1 TABLET BY MOUTH DAILY AS NEEDED FOR SWELLING OR WEIGHT GAIN 10/14/22   Fenton, Clint R, PA  gabapentin (NEURONTIN) 300 MG capsule Take 300 mg by mouth at bedtime. 01/08/23   [provider]  montelukast (SINGULAIR) 10 MG tablet TAKE 1 TABLET(10 MG) BY MOUTH AT BEDTIME 01/12/23   Nche, Bonna Gains, NP  Multiple Vitamin (MULTIVITAMIN) capsule Take 1 capsule by mouth daily.    [provider]  rosuvastatin (CRESTOR) 10 MG tablet TAKE 1 TABLET(10 MG) BY MOUTH DAILY 01/22/23   Nche, Bonna Gains, NP  warfarin (COUMADIN) 5 MG tablet TAKE 1 TABLET TO 2 TABLETS BY MOUTH DAILY AS DIRECTED BY COUMADIN CLINIC 11/17/22   Nahser, Deloris Ping, MD    Allergies  Allergen Reactions   Jardiance [Empagliflozin] Diarrhea   Lisinopril Cough    REVIEW OF SYSTEMS:  General: no fevers/chills/night sweats Eyes: no blurry vision, diplopia, or amaurosis ENT: no sore throat or hearing loss Resp: no cough, wheezing, or hemoptysis CV: no edema or palpitations GI: no abdominal pain, nausea, vomiting, diarrhea, or  constipation GU: no dysuria, frequency, or hematuria Skin: no  rash Neuro: no headache, numbness, tingling, or weakness of extremities Musculoskeletal: no joint pain or swelling Heme: no bleeding, DVT, or easy bruising Endo: no polydipsia or polyuria  BP 132/64   Pulse 64   Ht 5' 7.5" (1.715 m)   Wt 238 lb 3.2 oz (108 kg)   SpO2 98%   BMI 36.76 kg/m   PHYSICAL EXAM: GEN:  AO x 3 in no acute distress HEENT: normal Dentition: Normal Neck: JVP normal. +2 carotid upstrokes without bruits. No thyromegaly. Lungs: equal expansion, clear bilaterally CV: Apex is discrete and nondisplaced, irregular RR without murmur or gallop Abd: soft, non-tender, non-distended; no bruit; positive bowel sounds Ext: no edema, ecchymoses, or cyanosis Vascular: 2+ femoral pulses, 2+ radial pulses       Skin: warm and dry without rash Neuro: CN II-XII grossly intact; motor and sensory grossly intact    DATA AND STUDIES:  EKG: Performed today that I personally reviewed demonstrates sinus rhythm with PACs  2D ECHO:  December 2023 1. Left ventricular ejection fraction, by estimation, is 40 to 45%. The  left ventricle has mildly decreased function. The left ventricle  demonstrates global hypokinesis. There is mild concentric left ventricular  hypertrophy. Left ventricular diastolic  parameters are indeterminate.   2. Right ventricular systolic function is normal. The right ventricular  size is normal. There is normal pulmonary artery systolic pressure.   3. The mitral valve is normal in structure. Mild mitral valve  regurgitation. No evidence of mitral stenosis.   4. The aortic valve is normal in structure. Aortic valve regurgitation is  trivial. No aortic stenosis is present.   5. The inferior vena cava is normal in size with greater than 50%  respiratory variability, suggesting right atrial pressure of 3 mmHg.   April 2024 1. Left ventricular ejection fraction, by estimation, is 50 to 55%. The  left ventricle has low normal function. The left ventricle has  no regional  wall motion abnormalities. Left ventricular diastolic parameters are  indeterminate.   2. Right ventricular systolic function is normal. The right ventricular  size is normal. There is moderately elevated pulmonary artery systolic  pressure.   3. Left atrial size was mildly dilated.   4. Mitral regurgitation worst in the [facility] view. The mitral valve is  grossly normal. Moderate to severe mitral valve regurgitation.   5. Tricuspid valve regurgitation is mild to moderate.   6. The aortic valve was not well visualized. Aortic valve regurgitation  is mild.   TEE:  9/24  1. Left ventricular ejection fraction, by estimation, is 55 to 60%. The  left ventricle has normal function.   2. Right ventricular systolic function is normal. The right ventricular  size is normal.   3. Left atrial size was moderately dilated. No left atrial/left atrial  appendage thrombus was detected.   4. MR appears moderate PISA radius 0.5 ERO 0.10 cm2 and RV 24 cc Some  restriction to posterior leaflet motion but no marked primary pathology  Normal systolic flow in PV;s as well 3D vena contracta estimate 0.3 cm2 .  The mitral valve is abnormal.  Moderate mitral valve regurgitation.   5. The aortic valve is tricuspid. Aortic valve regurgitation is trivial.   CARDIAC CATH: n/a  STS RISK CALCULATOR: pending  NHYA CLASS: 1    ASSESSMENT AND PLAN:   Nonrheumatic mitral valve regurgitation  PAF (paroxysmal atrial fibrillation) (HCC)  Secondary hypercoagulable state (HCC)  Type 2 diabetes mellitus with complication, without long-term current use of insulin (HCC)  Hypertension associated with diabetes (HCC)  Hyperlipidemia associated with type 2 diabetes mellitus (HCC) - Plan: Lipid panel, Hepatic function panel  Stage 3a chronic kidney disease (HCC)  BMI 36.0-36.9,adult  MR: Only moderate after addition of medical therapy. Paroxysmal atrial fibrillation: Continue Xarelto 20 mg daily, Toprol  25 mg daily. Secondary hypercoagulable state: Continue Xarelto 20 mg daily. Type 2 diabetes mellitus: Continue Xarelto 20 mg daily, losartan 25 mg daily, rosuvastatin 10 mg daily.  Stop Jardiance given diarrhea Hypertension: Blood pressure is well-controlled. Hyperlipidemia: Continue rosuvastatin 10 mg, LDL goal is less than 70.  LDL last year was close to goal. CKD stage IIIa: Continue  losartan 25 mg.  Stop Jardiance given diarrhea. Elevated BMI: Does not look like patient met with pharmacy to discuss GLP-1 receptor agonist therapy.  Will refer to Pharm.D.  I spent 35 minutes reviewing all clinical data during and prior to this visit including all relevant imaging studies, laboratories, clinical information from other health systems and prior notes from both Cardiology and other specialties, interviewing the patient, conducting a complete physical examination, and coordinating care in order to formulate a comprehensive and personalized evaluation and treatment plan.   Orbie Pyo, MD  12/14/2023 3:53 PM    [facility] [address] [place], [place], [place]  [address] Phone: [phone]; Fax: [phone]

## 2023-12-14 ENCOUNTER — Ambulatory Visit: Payer: BC Managed Care – PPO | Attending: Internal Medicine | Admitting: Internal Medicine

## 2023-12-14 ENCOUNTER — Encounter: Payer: Self-pay | Admitting: Internal Medicine

## 2023-12-14 VITALS — BP 132/64 | HR 64 | Ht 67.5 in | Wt 238.2 lb

## 2023-12-14 DIAGNOSIS — E1169 Type 2 diabetes mellitus with other specified complication: Secondary | ICD-10-CM

## 2023-12-14 DIAGNOSIS — E118 Type 2 diabetes mellitus with unspecified complications: Secondary | ICD-10-CM | POA: Diagnosis not present

## 2023-12-14 DIAGNOSIS — D6869 Other thrombophilia: Secondary | ICD-10-CM

## 2023-12-14 DIAGNOSIS — E1159 Type 2 diabetes mellitus with other circulatory complications: Secondary | ICD-10-CM

## 2023-12-14 DIAGNOSIS — Z6836 Body mass index (BMI) 36.0-36.9, adult: Secondary | ICD-10-CM

## 2023-12-14 DIAGNOSIS — E785 Hyperlipidemia, unspecified: Secondary | ICD-10-CM

## 2023-12-14 DIAGNOSIS — I480 Paroxysmal atrial fibrillation: Secondary | ICD-10-CM

## 2023-12-14 DIAGNOSIS — I152 Hypertension secondary to endocrine disorders: Secondary | ICD-10-CM

## 2023-12-14 DIAGNOSIS — I340 Nonrheumatic mitral (valve) insufficiency: Secondary | ICD-10-CM | POA: Diagnosis not present

## 2023-12-14 DIAGNOSIS — N1831 Chronic kidney disease, stage 3a: Secondary | ICD-10-CM

## 2023-12-14 NOTE — Addendum Note (Signed)
 Addended by: Franchot Gallo on: 12/14/2023 03:54 PM   Modules accepted: Orders

## 2023-12-14 NOTE — Patient Instructions (Signed)
 Medication Instructions:  Your physician has recommended you make the following change in your medication:   1) STOP Jardiance  *If you need a refill on your cardiac medications before your next appointment, please call your pharmacy*  Lab Work: TODAY: Lipid panel, LFTs If you have labs (blood work) drawn today and your tests are completely normal, you will receive your results only by: MyChart Message (if you have MyChart) OR A paper copy in the mail If you have any lab test that is abnormal or we need to change your treatment, we will call you to review the results.  Follow-Up: At [facility], you and your health needs are our priority.  As part of our continuing mission to provide you with exceptional heart care, we have created designated Provider Care Teams.  These Care Teams include your primary Cardiologist (physician) and Advanced Practice Providers (APPs -  Physician Assistants and Nurse Practitioners) who all work together to provide you with the care you need, when you need it.  Your next appointment:   6 month(s)  The format for your next appointment:   In Person  Provider:   Tereso Newcomer, PA-C       Other Instructions   1st Floor: - Lobby - Registration  - Pharmacy  - Lab - Cafe  2nd Floor: - PV Lab - Diagnostic Testing (echo, CT, nuclear med)  3rd Floor: - Vacant  4th Floor: - TCTS (cardiothoracic surgery) - AFib Clinic - Structural Heart Clinic - Vascular Surgery  - Vascular Ultrasound  5th Floor: - [facility]Care Cardiology (general and EP) - Clinical Pharmacy for coumadin, hypertension, lipid, weight-loss medications, and med management appointments    Valet parking services will be available as well.

## 2023-12-15 ENCOUNTER — Encounter: Payer: Self-pay | Admitting: Internal Medicine

## 2023-12-15 LAB — HEPATIC FUNCTION PANEL
ALT: 15 IU/L (ref 0–32)
AST: 20 IU/L (ref 0–40)
Albumin: 4 g/dL (ref 3.9–4.9)
Alkaline Phosphatase: 57 IU/L (ref 44–121)
Bilirubin Total: 0.2 mg/dL (ref 0.0–1.2)
Bilirubin, Direct: 0.12 mg/dL (ref 0.00–0.40)
Total Protein: 7.7 g/dL (ref 6.0–8.5)

## 2023-12-15 LAB — LIPID PANEL
Chol/HDL Ratio: 2.1 ratio (ref 0.0–4.4)
Cholesterol, Total: 131 mg/dL (ref 100–199)
HDL: 61 mg/dL (ref >39)
LDL Chol Calc (NIH): 54 mg/dL (ref 0–99)
Triglycerides: 85 mg/dL (ref 0–149)
VLDL Cholesterol Cal: 16 mg/dL (ref 5–40)

## 2024-01-01 ENCOUNTER — Ambulatory Visit: Payer: Self-pay

## 2024-01-01 NOTE — Telephone Encounter (Signed)
 I called patient about her request for tessalon perles and her last office visit 08/10/23. I told patient that she will need to be seen. She acknowledge this and said that she will wait till follow up visit with [facility].

## 2024-01-01 NOTE — Telephone Encounter (Signed)
  Chief Complaint: Cough Symptoms: dry cough Frequency: Since Monday Pertinent Negatives: Patient denies fever, SOB, CP Disposition: [] ED /[] Urgent Care (no appt availability in office) / [] Appointment(In office/virtual)/ []  [place] [facility]/ [] Home Care/ [] Refused Recommended Disposition /[] [facility]/ [x]  Follow-up with PCP Additional Notes: patient called with concerns for dry cough that has been present since Monday. Patient states she doesn't feel bad-no URI symptoms besides cough. Patient denies fever, SOB and CP. Patient is requesting for tessalon pearles to be sent into her pharmacy. Patient states these helped her the last time she had a cough. Did review the usage of a humidifier at home, avoid tobacco exposure and appropriate fluid intake. Patient is requesting a message to her mychart.      Summary: Cough   Copied From CRM [id number]. Reason for Triage: Patient has had cough for a few days and would like a medication sent to pharmacy, said she took some kind of cough pearls last time     Reason for Disposition  SEVERE coughing spells (e.g., whooping sound after coughing, vomiting after coughing)  Answer Assessment - Initial Assessment Questions 1. ONSET: "When did the cough begin?"      Started Monday 2. SEVERITY: "How bad is the cough today?"      Currently cough isn't bad-4 out of 10 3. SPUTUM: "Describe the color of your sputum" (none, dry cough; clear, white, yellow, green)     dry 4. HEMOPTYSIS: "Are you coughing up any blood?" If so ask: "How much?" (flecks, streaks, tablespoons, etc.)     no 5. DIFFICULTY BREATHING: "Are you having difficulty breathing?" If Yes, ask: "How bad is it?" (e.g., mild, moderate, severe)    - MILD: No SOB at rest, mild SOB with walking, speaks normally in sentences, can lie down, no retractions, pulse < 100.    - MODERATE: SOB at rest, SOB with minimal exertion and prefers to sit, cannot lie down flat, speaks in phrases, mild  retractions, audible wheezing, pulse 100-120.    - SEVERE: Very SOB at rest, speaks in single words, struggling to breathe, sitting hunched forward, retractions, pulse > 120      Just when coughing 6. FEVER: "Do you have a fever?" If Yes, ask: "What is your temperature, how was it measured, and when did it start?"     no 7. CARDIAC HISTORY: "Do you have any history of heart disease?" (e.g., heart attack, congestive heart failure)      HTN, ablation 8. LUNG HISTORY: "Do you have any history of lung disease?"  (e.g., pulmonary embolus, asthma, emphysema)     asthma 9. PE RISK FACTORS: "Do you have a history of blood clots?" (or: recent major surgery, recent prolonged travel, bedridden)     No 10. OTHER SYMPTOMS: "Do you have any other symptoms?" (e.g., runny nose, wheezing, chest pain)       No 12. TRAVEL: "Have you traveled out of the country in the last month?" (e.g., travel history, exposures)       No  Protocols used: Cough - Acute Non-Productive-A-AH

## 2024-01-15 ENCOUNTER — Encounter: Payer: Self-pay | Admitting: Nurse Practitioner

## 2024-01-15 ENCOUNTER — Ambulatory Visit: Payer: BC Managed Care – PPO | Admitting: Nurse Practitioner

## 2024-01-15 VITALS — BP 134/80 | HR 70 | Temp 98.0°F | Ht 67.5 in | Wt 239.0 lb

## 2024-01-15 DIAGNOSIS — D649 Anemia, unspecified: Secondary | ICD-10-CM

## 2024-01-15 DIAGNOSIS — R053 Chronic cough: Secondary | ICD-10-CM

## 2024-01-15 DIAGNOSIS — J4521 Mild intermittent asthma with (acute) exacerbation: Secondary | ICD-10-CM | POA: Diagnosis not present

## 2024-01-15 DIAGNOSIS — E1169 Type 2 diabetes mellitus with other specified complication: Secondary | ICD-10-CM

## 2024-01-15 DIAGNOSIS — R918 Other nonspecific abnormal finding of lung field: Secondary | ICD-10-CM

## 2024-01-15 DIAGNOSIS — I10 Essential (primary) hypertension: Secondary | ICD-10-CM

## 2024-01-15 DIAGNOSIS — E785 Hyperlipidemia, unspecified: Secondary | ICD-10-CM

## 2024-01-15 LAB — RENAL FUNCTION PANEL
Albumin: 4.1 g/dL (ref 3.5–5.2)
BUN: 39 mg/dL — ABNORMAL HIGH (ref 6–23)
CO2: 24 meq/L (ref 19–32)
Calcium: 9.4 mg/dL (ref 8.4–10.5)
Chloride: 103 meq/L (ref 96–112)
Creatinine, Ser: 1.08 mg/dL (ref 0.40–1.20)
GFR: 53.22 mL/min — ABNORMAL LOW (ref >60.00)
Glucose, Bld: 90 mg/dL (ref 70–99)
Phosphorus: 2.7 mg/dL (ref 2.3–4.6)
Potassium: 4.9 meq/L (ref 3.5–5.1)
Sodium: 135 meq/L (ref 135–145)

## 2024-01-15 LAB — CBC
HCT: 34.3 % — ABNORMAL LOW (ref 36.0–46.0)
Hemoglobin: 11 g/dL — ABNORMAL LOW (ref 12.0–15.0)
MCHC: 32.2 g/dL (ref 30.0–36.0)
MCV: 88.5 fl (ref 78.0–100.0)
Platelets: 266 10*3/uL (ref 150.0–400.0)
RBC: 3.88 Mil/uL (ref 3.87–5.11)
RDW: 14 % (ref 11.5–15.5)
WBC: 4.5 10*3/uL (ref 4.0–10.5)

## 2024-01-15 LAB — HEMOGLOBIN A1C: Hgb A1c MFr Bld: 6.4 % (ref 4.6–6.5)

## 2024-01-15 MED ORDER — BUDESONIDE-FORMOTEROL FUMARATE 160-4.5 MCG/ACT IN AERO: 2.0000 | INHALATION_SPRAY | Freq: Two times a day (BID) | RESPIRATORY_TRACT | 3 refills | Status: DC

## 2024-01-15 MED ORDER — BUDESONIDE-FORMOTEROL FUMARATE 160-4.5 MCG/ACT IN AERO
2.0000 | INHALATION_SPRAY | Freq: Two times a day (BID) | RESPIRATORY_TRACT | 3 refills | Status: DC
Start: 2024-01-15 — End: 2024-01-15

## 2024-01-15 MED ORDER — ROSUVASTATIN CALCIUM 10 MG PO TABS: 10.0000 mg | ORAL_TABLET | Freq: Every day | ORAL | 1 refills | Status: DC

## 2024-01-15 MED ORDER — BENZONATATE 200 MG PO CAPS: 200.0000 mg | ORAL_CAPSULE | Freq: Every day | ORAL | 5 refills | Status: DC | PRN

## 2024-01-15 NOTE — Assessment & Plan Note (Signed)
 Attempted in office PFT: unable to complete. Unable to tolerate nebulizer treatment in office Advised to schedule f/up appointment with pulmonologist Benzonatate sent for prn use

## 2024-01-15 NOTE — Assessment & Plan Note (Signed)
 Use of albuterol 3-4x/week Cough is worse at hs-productive, relief with use of hand held inhaler and benzonatate She stopped advair 2months ago. State she was out of refills Switch advair to symbicort

## 2024-01-15 NOTE — Assessment & Plan Note (Addendum)
 Normal UACr, use of an ARB LDL at goal with crestor Repeat hgbA1c, BMP

## 2024-01-15 NOTE — Assessment & Plan Note (Signed)
 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 No appreciable interval change. Dilated main pulmonary artery, suggesting pulmonary arterial hypertension.  Advised to schedule f/up with pulmonologist

## 2024-01-15 NOTE — Progress Notes (Signed)
 Established Patient Visit  Patient: Stacy Chapman   DOB: 1956/08/08   68 y.o. Female  MRN: [id number] Visit Date: 01/15/2024  Subjective:    Chief Complaint  Patient presents with   Follow-up   Cough    Productive cough thick phlegm for 2-3 weeks   Chronic cough Attempted in office PFT: unable to complete. 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 No appreciable interval change. Dilated main pulmonary artery, suggesting pulmonary arterial hypertension.  Advised to schedule f/up with pulmonologist  Mild intermittent asthma with acute exacerbation Use of albuterol 3-4x/week Cough is worse at hs-productive, relief with use of hand held inhaler and benzonatate She stopped advair 2months ago. State she was out of refills Switch advair to symbicort  BP Readings from Last 3 Encounters:  01/15/24 134/80  12/14/23 132/64  07/17/23 124/70    Reviewed medical, surgical, and social history today  Medications: Outpatient Medications Prior to Visit  Medication Sig   acetaminophen (TYLENOL) 500 MG tablet Take 1,000 mg by mouth every 8 (eight) hours as needed for moderate pain.   albuterol (VENTOLIN HFA) 108 (90 Base) MCG/ACT inhaler INHALE 1 TO 2 PUFFS INTO THE LUNGS EVERY 6 HOURS AS NEEDED FOR WHEEZING OR SHORTNESS OF BREATH   azelastine (ASTELIN) 0.1 % nasal spray Place 1 spray into both nostrils 2 (two) times daily. Use in each nostril as directed    Biotin 2.5 MG TABS Take 2.5 mg by mouth daily.   ferrous gluconate (FERGON) 324 MG tablet Take 324 mg by mouth daily with breakfast.   furosemide (LASIX) 20 MG tablet Take 1 tablet (20 mg total) by mouth 2 (two) times daily. (Patient taking differently: Take 20 mg by mouth daily.)   losartan (COZAAR) 25 MG tablet Take 1 tablet (25 mg total) by mouth daily.   magnesium gluconate (MAGONATE) 500 MG tablet Take 500 mg by mouth at bedtime.   metoprolol succinate (TOPROL XL) 25 MG 24 hr tablet Take 0.5 tablets (12.5 mg total) by mouth daily.   pantoprazole (PROTONIX) 40 MG tablet TAKE 1 TABLET(40 MG) BY MOUTH DAILY   rivaroxaban (XARELTO) 20 MG TABS tablet Take 1 tablet (20 mg total) by mouth daily with supper.   spironolactone (ALDACTONE) 25 MG tablet Take 1 tablet (25 mg total) by mouth daily.   [DISCONTINUED] rosuvastatin (CRESTOR) 10 MG tablet TAKE 1 TABLET(10 MG) BY MOUTH DAILY   [DISCONTINUED] ascorbic acid (VITAMIN C) 500 MG tablet Take 500 mg by mouth daily. (Patient not taking: Reported on 01/15/2024)   [DISCONTINUED] fluticasone-salmeterol (ADVAIR HFA) 115-21 MCG/ACT inhaler Inhale 2 puffs into the lungs 2 (two) times daily. (Patient not taking: Reported on 01/15/2024)   [DISCONTINUED] Multiple Vitamin (MULTIVITAMIN) capsule Take 1 capsule by mouth daily. One a day (Patient not taking: Reported on 01/15/2024)   No facility-administered medications prior to visit.   Reviewed past medical and social history.   ROS per HPI above      Objective:  BP 134/80 (BP Location: Left Arm, Patient Position: Supine, Cuff Size: Large)  Pulse 70   Temp 98 F (36.7 C) (Temporal)   Ht 5' 7.5" (1.715 m)   Wt 239 lb (108.4 kg)   SpO2 97%   BMI 36.88 kg/m      Physical Exam Vitals and nursing note reviewed.  Cardiovascular:     Rate and Rhythm: Normal rate and regular rhythm.     Pulses: Normal pulses.     Heart sounds: Normal heart sounds.  Pulmonary:     Effort: Pulmonary effort is normal.      Breath sounds: Wheezing present. No rales.     Comments: Worsening cough with albuterol nebulizer treatement Musculoskeletal:     Right lower leg: No edema.     Left lower leg: No edema.  Neurological:     Mental Status: She is alert and oriented to person, place, and time.     No results found for any visits on 01/15/24.    Assessment & Plan:    Problem List Items Addressed This Visit     Chronic cough   Attempted in office PFT: unable to complete. Unable to tolerate nebulizer treatment in office Advised to schedule f/up appointment with pulmonologist Benzonatate sent for prn use      Relevant Medications   benzonatate (TESSALON) 200 MG capsule   Mild intermittent asthma with acute exacerbation   Use of albuterol 3-4x/week Cough is worse at hs-productive, relief with use of hand held inhaler and benzonatate She stopped advair 2months ago. 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 No appreciable interval change. Dilated main pulmonary artery, suggesting pulmonary arterial hypertension.  Advised to schedule f/up with pulmonologist      Type 2 diabetes mellitus with other specified complication, without long-term current use of insulin (HCC) - Primary   Normal UACr Repeat hgbA1c, BMP      Relevant Medications   rosuvastatin (CRESTOR) 10 MG tablet   Other Relevant Orders   Hemoglobin A1c   Renal Function Panel   Other Visit Diagnoses       Anemia, unspecified type       Relevant Orders   CBC     HTN (hypertension), benign       Relevant Medications   rosuvastatin (CRESTOR)  10 MG tablet     Hyperlipidemia associated with type 2 diabetes mellitus (HCC)       Relevant Medications   rosuvastatin (CRESTOR) 10 MG tablet      Return in about 6 months (around 07/16/2024) for HTN, DM, hyperlipidemia (fasting).     Alysia Penna, NP

## 2024-01-15 NOTE — Patient Instructions (Addendum)
 Schedule f/up appointment with pulmonology. [phone] Go to lab

## 2024-01-17 ENCOUNTER — Encounter: Payer: Self-pay | Admitting: Nurse Practitioner

## 2024-01-29 ENCOUNTER — Other Ambulatory Visit: Payer: Self-pay | Admitting: Nurse Practitioner

## 2024-01-29 DIAGNOSIS — J324 Chronic pansinusitis: Secondary | ICD-10-CM

## 2024-02-01 ENCOUNTER — Other Ambulatory Visit: Payer: Self-pay

## 2024-02-01 DIAGNOSIS — J324 Chronic pansinusitis: Secondary | ICD-10-CM

## 2024-02-01 MED ORDER — AZELASTINE HCL 0.1 % NA SOLN: 1.0000 | Freq: Two times a day (BID) | NASAL | 0 refills | Status: AC

## 2024-02-01 NOTE — Progress Notes (Signed)
 Stacy Chapman

## 2024-02-02 ENCOUNTER — Encounter: Payer: Self-pay | Admitting: Pharmacist

## 2024-02-02 ENCOUNTER — Ambulatory Visit: Attending: Cardiology | Admitting: Pharmacist

## 2024-02-02 ENCOUNTER — Telehealth: Payer: Self-pay | Admitting: Pharmacist

## 2024-02-02 ENCOUNTER — Telehealth: Payer: Self-pay | Admitting: Pharmacy Technician

## 2024-02-02 ENCOUNTER — Other Ambulatory Visit (HOSPITAL_COMMUNITY): Payer: Self-pay

## 2024-02-02 VITALS — Ht 67.0 in | Wt 236.2 lb

## 2024-02-02 DIAGNOSIS — E118 Type 2 diabetes mellitus with unspecified complications: Secondary | ICD-10-CM

## 2024-02-02 DIAGNOSIS — Z6836 Body mass index (BMI) 36.0-36.9, adult: Secondary | ICD-10-CM

## 2024-02-02 MED ORDER — MOUNJARO 2.5 MG/0.5ML ~~LOC~~ SOAJ: 2.5000 mg | SUBCUTANEOUS | 0 refills | Status: DC

## 2024-02-02 NOTE — Telephone Encounter (Signed)
 Pharmacy Patient Advocate Encounter   Received notification from Pt Calls Messages that prior authorization for Mounjaro is required/requested.   Insurance verification completed.   The patient is insured through [employer] .   Per test claim: The current 02/02/24 day co-pay is, $309.18- one month.  No PA needed at this time. This test claim was processed through [facility]- copay amounts may vary at other pharmacies due to pharmacy/plan contracts, or as the patient moves through the different stages of their insurance plan.

## 2024-02-02 NOTE — Progress Notes (Signed)
 Patient ID: Stacy Chapman                 DOB: 1956/07/15                    MRN: [id number]     HPI: Stacy Chapman is a 68 y.o. female patient referred to pharmacy clinic by Dr. Lorie Rook to initiate GLP1-RA therapy. PMH is significant for HTN, HLD, T2DM, asthma, Afib on Xarelto , mitral valve regurgitation, aortic atherosclerosis, CKD 3a, and obesity. Most recent BMI 37.4.  Baseline weight and BMI: 239 lbs, BMI 37.4 (Ht 5'7") Current weight and BMI: 236 lbs, BMI 36.99 kg/m2   Current meds that affect weight: none  Patient was diagnosed with T2DM in 2018 with an A1C of 6.6%. Patient has been seen previously in clinic with with Stacy Chapman, PharmD. Denied GLP-1 initiation at visit due to reduction in appetite on Jardiance  and wanting to give it time to work. Jardiance  was since d/c'd in March 2025 due to intolerable diarrhea. Her diabetes is currently managed with lifestyle interventions alone. Most recent A1C (01/15/24) currently controlled at 6.4%. UACR 1.0, normal, renal function stable.  She has made major dietary changes and lost 3 lbs in past 2.5 weeks. She works full time so does not have time to do regular exercise. However, she   Coverage assessment: mounjaro (tier 2 preferred brand) - $25/month with mfr coupon   Diet: stopped fried food, stopped consuming white bread, pasta and processed food. Reduced eating out.  Drink: water all day long and  sweet tea once a week  Once in while enjoys cake   Exercise: none   Family History:  Father (Deceased) Hypertension  Stroke (Age: 84)    Sister Dementia  Diabetes  Eczema    Sister (Deceased) Dementia    Brother (Deceased) Hypertension  Lung cancer  Stroke (Age: 53)    Paternal Uncle Cancer (unknown type)      Social History: Never smoker, no smokeless tobacco use. Alcohol - 2-3 per week.Does not use illicit drugs.  Labs: Lab Results  Component Value Date   HGBA1C 6.4 01/15/2024    Wt Readings from Last 1  Encounters:  01/15/24 239 lb (108.4 kg)    BP Readings from Last 1 Encounters:  01/15/24 134/80   Pulse Readings from Last 1 Encounters:  01/15/24 70       Component Value Date/Time   CHOL 131 12/14/2023 1613   TRIG 85 12/14/2023 1613   HDL 61 12/14/2023 1613   CHOLHDL 2.1 12/14/2023 1613   CHOLHDL 2 01/15/2023 1200   VLDL 13.2 01/15/2023 1200   LDLCALC 54 12/14/2023 1613    Past Medical History:  Diagnosis Date   Anemia    Arthritis    Asthma    Diabetes (HCC)    Diarrhea of presumed infectious origin 10/09/2022   Essential hypertension    Morbid obesity (HCC)    Persistent atrial fibrillation (HCC)    Stroke (HCC) 2018   TIA    Current Outpatient Medications on File Prior to Visit  Medication Sig Dispense Refill   acetaminophen  (TYLENOL ) 500 MG tablet Take 1,000 mg by mouth every 8 (eight) hours as needed for moderate pain.     albuterol  (VENTOLIN  HFA) 108 (90 Base) MCG/ACT inhaler INHALE 1 TO 2 PUFFS INTO THE LUNGS EVERY 6 HOURS AS NEEDED FOR WHEEZING OR SHORTNESS OF BREATH 20.1 g 0   azelastine  (ASTELIN ) 0.1 % nasal spray Place 1 spray into  both nostrils 2 (two) times daily. Use in each nostril as directed 90 mL 0   azelastine  (ASTELIN ) 0.1 % nasal spray Place 1 spray into both nostrils 2 (two) times daily. Use in each nostril as directed 90 mL 0   benzonatate  (TESSALON ) 200 MG capsule Take 1 capsule (200 mg total) by mouth daily as needed. 30 capsule 5   Biotin 2.5 MG TABS Take 2.5 mg by mouth daily.     budesonide -formoterol  (SYMBICORT ) 160-4.5 MCG/ACT inhaler Inhale 2 puffs into the lungs 2 (two) times daily. 10.2 g 3   ferrous gluconate (FERGON) 324 MG tablet Take 324 mg by mouth daily with breakfast.     furosemide  (LASIX ) 20 MG tablet Take 1 tablet (20 mg total) by mouth 2 (two) times daily. (Patient taking differently: Take 20 mg by mouth daily.) 90 tablet 3   losartan  (COZAAR ) 25 MG tablet Take 1 tablet (25 mg total) by mouth daily. 90 tablet 3   magnesium   gluconate (MAGONATE) 500 MG tablet Take 500 mg by mouth at bedtime.     metoprolol  succinate (TOPROL  XL) 25 MG 24 hr tablet Take 0.5 tablets (12.5 mg total) by mouth daily. 45 tablet 3   pantoprazole  (PROTONIX ) 40 MG tablet TAKE 1 TABLET(40 MG) BY MOUTH DAILY 90 tablet 1   rivaroxaban  (XARELTO ) 20 MG TABS tablet Take 1 tablet (20 mg total) by mouth daily with supper. 90 tablet 3   rosuvastatin  (CRESTOR ) 10 MG tablet Take 1 tablet (10 mg total) by mouth daily. 90 tablet 1   spironolactone  (ALDACTONE ) 25 MG tablet Take 1 tablet (25 mg total) by mouth daily. 90 tablet 3   No current facility-administered medications on file prior to visit.    Allergies  Allergen Reactions   Jardiance  [Empagliflozin ] Diarrhea   Lisinopril  Cough     Assessment/Plan:  1. Weight loss - Patient has not met goal of at least 5% of body weight loss with comprehensive lifestyle modifications alone in the past 3-6 months. Pharmacotherapy is appropriate to pursue as augmentation. Will start Mounjaro 2.5 mg once a week. Confirmed patient has no personal or family history of medullary thyroid  carcinoma (MTC) or Multiple Endocrine Neoplasia syndrome type 2 (MEN 2). Injection technique reviewed at today's visit.  Advised patient on common side effects including nausea, diarrhea, dyspepsia, decreased appetite, and fatigue. Counseled patient on reducing meal size and how to titrate medication to minimize side effects. Counseled patient to call if intolerable side effects or if experiencing dehydration, abdominal pain, or dizziness. Patient will adhere to dietary modifications and will target at least 150 minutes of moderate intensity exercise weekly.   Follow up in 1 month via telephone for tolerability update and dose titration.   Thank you,  Nickola Baron, Pharm.D [facility] [address] [place], [place] [address]  Phone: [phone]; Fax: [phone]

## 2024-02-02 NOTE — Telephone Encounter (Signed)
 See other encounter form same day for more info

## 2024-02-24 ENCOUNTER — Other Ambulatory Visit: Payer: BC Managed Care – PPO

## 2024-03-08 ENCOUNTER — Telehealth: Payer: Self-pay | Admitting: Pharmacy Technician

## 2024-03-08 ENCOUNTER — Other Ambulatory Visit (HOSPITAL_COMMUNITY): Payer: Self-pay

## 2024-03-08 NOTE — Telephone Encounter (Signed)
 Pharmacy Patient Advocate Encounter   Received notification from Pt Calls Messages that prior authorization for ozempic is required/requested.   Insurance verification completed.   The patient is insured through [employer] .   Per test claim: The current 03/08/24 day co-pay is, $285.66- one month.  No PA needed at this time. This test claim was processed through [facility]- copay amounts may vary at other pharmacies due to pharmacy/plan contracts, or as the patient moves through the different stages of their insurance plan.

## 2024-03-14 ENCOUNTER — Other Ambulatory Visit: Payer: Self-pay | Admitting: Nurse Practitioner

## 2024-03-14 DIAGNOSIS — E1169 Type 2 diabetes mellitus with other specified complication: Secondary | ICD-10-CM

## 2024-03-14 DIAGNOSIS — I10 Essential (primary) hypertension: Secondary | ICD-10-CM

## 2024-04-25 ENCOUNTER — Telehealth: Payer: Self-pay

## 2024-04-25 NOTE — Telephone Encounter (Signed)
 Copied from CRM [id number]. Topic: General - Other >> Apr 25, 2024 10:29 AM [place] J wrote: Reason for CRM: Delon from Care Management Assistant with [facility] is calling to inform is that the patient had a dedicated registered nurse health coach, but the health coach is no longer able to reach patient. They have assigned to her a new Control and instrumentation engineer.  New Health Coach: Burnard Abt / [phone]   ----------------------------------------------------------------------- From previous Reason for Contact - Medical Advice: Reason for CRM:

## 2024-05-12 ENCOUNTER — Other Ambulatory Visit: Payer: Self-pay | Admitting: Physician Assistant

## 2024-06-10 ENCOUNTER — Other Ambulatory Visit: Payer: Self-pay | Admitting: Internal Medicine

## 2024-06-15 ENCOUNTER — Encounter: Payer: Self-pay | Admitting: *Deleted

## 2024-06-16 ENCOUNTER — Encounter: Payer: Self-pay | Admitting: *Deleted

## 2024-06-16 ENCOUNTER — Other Ambulatory Visit: Payer: Self-pay | Admitting: Internal Medicine

## 2024-06-16 DIAGNOSIS — I340 Nonrheumatic mitral (valve) insufficiency: Secondary | ICD-10-CM | POA: Insufficient documentation

## 2024-06-16 DIAGNOSIS — N1831 Chronic kidney disease, stage 3a: Secondary | ICD-10-CM | POA: Insufficient documentation

## 2024-06-16 DIAGNOSIS — I2510 Atherosclerotic heart disease of native coronary artery without angina pectoris: Secondary | ICD-10-CM | POA: Insufficient documentation

## 2024-06-16 NOTE — Assessment & Plan Note (Signed)
 Labs 01/15/2024: K 4.9, creatinine 1.08, eGFR 53.22.***

## 2024-06-16 NOTE — Assessment & Plan Note (Signed)
 Labs 12/14/2023: LDL 54, HDL 61, triglycerides 85, total close to 131.  LDL optimal.***

## 2024-06-16 NOTE — Assessment & Plan Note (Signed)
 Status post PVI ablation 03/2023.***

## 2024-06-16 NOTE — Assessment & Plan Note (Signed)
 Previous echo in April 2024 suggested moderate to severe mitral regurgitation.  On medical therapy, TEE in 06/2023 demonstrated moderate mitral regurgitation.***

## 2024-06-16 NOTE — Progress Notes (Signed)
 "     OFFICE NOTE:    Date:  06/17/2024  ID:  Stacy Chapman, DOB 07-18-1956, MRN [id number] PCP: Katheen Roselie Rockford, NP  [facility] Providers Cardiologist:  Lurena MARLA Red, MD Cardiology APP:  Lelon Glendia DASEN, PA-C  Electrophysiologist:  Eulas FORBES Furbish, MD        Mitral regurgitation Moderate to severe MR, EF 50 to 55% TTE April 2024 TTE 06/22/2023: EF 55-60, no RWMA, normal RVSF, mild LAE, mild MR, trivial AI TEE 07/13/2023: EF 55-60, normal RVSF, moderate LAE, moderate MR with some restriction of posterior leaflet, trivial AI Persistent atrial fibrillation S/p PVI ablation 03/2023  Coronary artery disease  Preablation CT 03/2023: CAC score 191 (90th percentile) Tachy-brady syndrome  Aortic atherosclerosis  Diabetes mellitus type II (diet controlled) Intol of Jardiance  (diarrhea) Chronic kidney disease 3a Hypertension  Hyperlipidemia        Discussed the use of AI scribe software for clinical note transcription with the patient, who gave verbal consent to proceed. History of Present Illness He returns for follow-up of atrial fibrillation and valvular heart disease.  She was last seen by Dr. Red in 12/2023. She was referred to our PharmD for obesity, diabetes. She was started on Mounjaro  in 01/2024.   She experiences no chest pain, heaviness, pressure, or tightness, and no jaw or arm pain upon exertion. She experiences shortness of breath, particularly when walking a quarter of a mile, but notes that this has not worsened since her last visit. She can walk up a flight of stairs while holding the rail without needing to stop. She has no orthopnea and does not experience shortness of breath at night. She reports swelling in her left ankle, which worsens with prolonged standing at work. The swelling improves with rest and elevation over two to three days. 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    ROS-See HPI    Studies Reviewed:  EKG Interpretation Date/Time:  Friday June 17 2024 10:15:50 EDT Ventricular Rate:  75 PR Interval:  140 QRS Duration:  74 QT Interval:  360 QTC Calculation: 402 R Axis:   50  Text Interpretation: Sinus rhythm with occasional Premature ventricular complexes Nonspecific ST and T wave abnormality No significant change since last tracing Confirmed by Lelon Glendia [phone]) on 06/17/2024 10:38:12 AM         Physical Exam:  VS:  BP 122/76 (BP Location: Left Arm, Patient Position: Sitting, Cuff Size: Large)   Pulse 75   Ht 5' 7 (1.702 m)   Wt 231 lb (104.8 kg)   SpO2 99%   BMI 36.18 kg/m        Wt Readings from Last 3 Encounters:  06/17/24 231 lb (104.8 kg)  02/02/24 236 lb 3.2 oz (107.1 kg)  01/15/24 239 lb (108.4 kg)    Constitutional:      Appearance: Healthy appearance. Not in distress.  Neck:     Vascular: No JVR. JVD normal.  Pulmonary:     Breath sounds: Normal breath sounds. No wheezing. No rales.  Cardiovascular:     Normal rate. Regular rhythm.     Murmurs: There is no murmur.  Edema:    Peripheral edema absent.  Abdominal:     Palpations: Abdomen is soft.       Assessment and Plan:    Assessment & Plan Nonrheumatic mitral valve regurgitation Previous echo in April 2024 suggested moderate to severe  mitral regurgitation.  On medical therapy, TEE in 06/2023 demonstrated moderate mitral regurgitation.  She notes shortness of breath with exertion which is overall stable.  She is NYHA II.  I cannot appreciate a murmur on exam today. -Continue furosemide  20 mg daily as needed, losartan  25 mg daily, metoprolol  succinate 12.5 mg daily, spironolactone  25 mg daily -Repeat echo in 6 months -Follow-up Dr. Wendel in 6 months (after echo) Persistent atrial fibrillation (HCC) Status post PVI ablation 03/2023.  She is maintaining sinus rhythm.  She is tolerating anticoagulation.  Hemoglobin in April 2025 was stable at 11 and  creatinine was normal at 1.08. -Continue Xarelto  20 mg daily -Continue Toprol -XL 12.5 mg daily Coronary artery disease involving native coronary artery of native heart without angina pectoris Elevated calcium  score on pre-PVI ablation CT. She is doing well without chest discomfort to suggest angina.  At this point, I do not think she needs ischemic evaluation.  If she develops worsening shortness of breath or chest discomfort, would consider CCTA versus PET MPI. -Continue rosuvastatin  10 mg daily Obesity (BMI 30-39.9) Patient was seen by our Pharm.D. and started on GLP-1 agonist.  However, she cannot afford this.  She has been able to lose 8 pounds since her last visit with diet and exercise.  I have encouraged her to continue this. Mixed hyperlipidemia Labs 12/14/2023: LDL 54, HDL 61, triglycerides 85, total close to 131.  LDL optimal. -Continue rosuvastatin  10 mg daily Stage 3a chronic kidney disease (HCC) Labs 01/15/2024: K 4.9, creatinine 1.08, eGFR 53.22.  Renal function stable. Type 2 diabetes mellitus with complication, without long-term current use of insulin (HCC) Most recent A1c April 2025 optimal at 6.4.         Dispo:  Return in about 6 months (around 12/15/2024) for Routine Follow Up, w/ Dr. Wendel.  Signed, Glendia Ferrier, PA-C   "

## 2024-06-16 NOTE — Assessment & Plan Note (Signed)
 Patient was seen by our Pharm.D. and started on GLP-1 agonist.***

## 2024-06-16 NOTE — Assessment & Plan Note (Signed)
 Elevated calcium  score on pre-PVI ablation CT.***

## 2024-06-17 ENCOUNTER — Ambulatory Visit: Attending: Physician Assistant | Admitting: Physician Assistant

## 2024-06-17 ENCOUNTER — Encounter: Payer: Self-pay | Admitting: Physician Assistant

## 2024-06-17 ENCOUNTER — Other Ambulatory Visit: Payer: Self-pay | Admitting: Physician Assistant

## 2024-06-17 VITALS — BP 122/76 | HR 75 | Ht 67.0 in | Wt 231.0 lb

## 2024-06-17 DIAGNOSIS — N1831 Chronic kidney disease, stage 3a: Secondary | ICD-10-CM

## 2024-06-17 DIAGNOSIS — E782 Mixed hyperlipidemia: Secondary | ICD-10-CM

## 2024-06-17 DIAGNOSIS — I2510 Atherosclerotic heart disease of native coronary artery without angina pectoris: Secondary | ICD-10-CM | POA: Diagnosis not present

## 2024-06-17 DIAGNOSIS — I340 Nonrheumatic mitral (valve) insufficiency: Secondary | ICD-10-CM | POA: Diagnosis not present

## 2024-06-17 DIAGNOSIS — I4819 Other persistent atrial fibrillation: Secondary | ICD-10-CM

## 2024-06-17 DIAGNOSIS — E669 Obesity, unspecified: Secondary | ICD-10-CM | POA: Diagnosis not present

## 2024-06-17 DIAGNOSIS — E118 Type 2 diabetes mellitus with unspecified complications: Secondary | ICD-10-CM

## 2024-06-17 MED ORDER — PANTOPRAZOLE SODIUM 40 MG PO TBEC: 40.0000 mg | DELAYED_RELEASE_TABLET | Freq: Every day | ORAL | 1 refills | Status: AC

## 2024-06-17 NOTE — Patient Instructions (Signed)
 Medication Instructions:  Your physician recommends that you continue on your current medications as directed. Please refer to the Current Medication list given to you today.  *If you need a refill on your cardiac medications before your next appointment, please call your pharmacy*  Lab Work: None ordered  If you have labs (blood work) drawn today and your tests are completely normal, you will receive your results only by: MyChart Message (if you have MyChart) OR A paper copy in the mail If you have any lab test that is abnormal or we need to change your treatment, we will call you to review the results.  Testing/Procedures: Your physician has requested that you have an echocardiogram IN 6 MONTHS.SABRA Echocardiography is a painless test that uses sound waves to create images of your heart. It provides your doctor with information about the size and shape of your heart and how well your heart's chambers and valves are working. This procedure takes approximately one hour. There are no restrictions for this procedure. Please do NOT wear cologne, perfume, aftershave, or lotions (deodorant is allowed). Please arrive 15 minutes prior to your appointment time.  Please note: We ask at that you not bring children with you during ultrasound (echo/ vascular) testing. Due to room size and safety concerns, children are not allowed in the ultrasound rooms during exams. Our front office staff cannot provide observation of children in our lobby area while testing is being conducted. An adult accompanying a patient to their appointment will only be allowed in the ultrasound room at the discretion of the ultrasound technician under special circumstances. We apologize for any inconvenience.   Follow-Up: At [facility], you and your health needs are our priority.  As part of our continuing mission to provide you with exceptional heart care, our providers are all part of one team.  This team includes your  primary Cardiologist (physician) and Advanced Practice Providers or APPs (Physician Assistants and Nurse Practitioners) who all work together to provide you with the care you need, when you need it.  Your next appointment:   6 month(s) AFTER ECHO  Provider:   Arun K Thukkani, MD    We recommend signing up for the patient portal called MyChart.  Sign up information is provided on this After Visit Summary.  MyChart is used to connect with patients for Virtual Visits (Telemedicine).  Patients are able to view lab/test results, encounter notes, upcoming appointments, etc.  Non-urgent messages can be sent to your provider as well.   To learn more about what you can do with MyChart, go to [email or web address].   Other Instructions

## 2024-06-18 ENCOUNTER — Other Ambulatory Visit: Payer: Self-pay | Admitting: Internal Medicine

## 2024-06-18 DIAGNOSIS — I495 Sick sinus syndrome: Secondary | ICD-10-CM

## 2024-06-18 DIAGNOSIS — I480 Paroxysmal atrial fibrillation: Secondary | ICD-10-CM

## 2024-06-18 DIAGNOSIS — I340 Nonrheumatic mitral (valve) insufficiency: Secondary | ICD-10-CM

## 2024-06-20 NOTE — Telephone Encounter (Signed)
 Prescription refill request for Xarelto  received.  Indication: AF Last office visit: 06/17/24  GORMAN Ferrier PA-C Weight: 895.1xh Age: 68 Scr: 1.08 on 01/15/24 CrCl: 83.63  Based on above findings Xarelto  20mg  daily is the appropriate dose.  Refill approved.

## 2024-07-18 ENCOUNTER — Encounter: Payer: Self-pay | Admitting: Nurse Practitioner

## 2024-07-18 ENCOUNTER — Ambulatory Visit: Admitting: Nurse Practitioner

## 2024-07-18 VITALS — BP 128/72 | HR 73 | Ht 67.0 in | Wt 236.4 lb

## 2024-07-18 DIAGNOSIS — Z780 Asymptomatic menopausal state: Secondary | ICD-10-CM | POA: Diagnosis not present

## 2024-07-18 DIAGNOSIS — Z23 Encounter for immunization: Secondary | ICD-10-CM

## 2024-07-18 DIAGNOSIS — M19011 Primary osteoarthritis, right shoulder: Secondary | ICD-10-CM | POA: Insufficient documentation

## 2024-07-18 DIAGNOSIS — E1169 Type 2 diabetes mellitus with other specified complication: Secondary | ICD-10-CM

## 2024-07-18 DIAGNOSIS — R053 Chronic cough: Secondary | ICD-10-CM | POA: Diagnosis not present

## 2024-07-18 DIAGNOSIS — J4521 Mild intermittent asthma with (acute) exacerbation: Secondary | ICD-10-CM | POA: Diagnosis not present

## 2024-07-18 DIAGNOSIS — M5030 Other cervical disc degeneration, unspecified cervical region: Secondary | ICD-10-CM | POA: Insufficient documentation

## 2024-07-18 DIAGNOSIS — E785 Hyperlipidemia, unspecified: Secondary | ICD-10-CM

## 2024-07-18 LAB — COMPREHENSIVE METABOLIC PANEL WITH GFR
ALT: 16 U/L (ref 0–35)
AST: 20 U/L (ref 0–37)
Albumin: 4.3 g/dL (ref 3.5–5.2)
Alkaline Phosphatase: 50 U/L (ref 39–117)
BUN: 38 mg/dL — ABNORMAL HIGH (ref 6–23)
CO2: 23 meq/L (ref 19–32)
Calcium: 10.1 mg/dL (ref 8.4–10.5)
Chloride: 106 meq/L (ref 96–112)
Creatinine, Ser: 1.38 mg/dL — ABNORMAL HIGH (ref 0.40–1.20)
GFR: 39.51 mL/min — ABNORMAL LOW (ref >60.00)
Glucose, Bld: 105 mg/dL — ABNORMAL HIGH (ref 70–99)
Potassium: 5 meq/L (ref 3.5–5.1)
Sodium: 138 meq/L (ref 135–145)
Total Bilirubin: 0.5 mg/dL (ref 0.2–1.2)
Total Protein: 8.5 g/dL — ABNORMAL HIGH (ref 6.0–8.3)

## 2024-07-18 LAB — MICROALBUMIN / CREATININE URINE RATIO
Creatinine,U: 30.3 mg/dL
Microalb Creat Ratio: UNDETERMINED mg/g (ref 0.0–30.0)
Microalb, Ur: <0.7 mg/dL

## 2024-07-18 LAB — HEMOGLOBIN A1C: Hgb A1c MFr Bld: 6.2 % (ref 4.6–6.5)

## 2024-07-18 MED ORDER — ROSUVASTATIN CALCIUM 10 MG PO TABS: 10.0000 mg | ORAL_TABLET | Freq: Every evening | ORAL | 3 refills | Status: DC

## 2024-07-18 MED ORDER — ALBUTEROL SULFATE HFA 108 (90 BASE) MCG/ACT IN AERS: 1.0000 | INHALATION_SPRAY | Freq: Four times a day (QID) | RESPIRATORY_TRACT | 2 refills | Status: AC | PRN

## 2024-07-18 MED ORDER — BENZONATATE 200 MG PO CAPS
200.0000 mg | ORAL_CAPSULE | Freq: Every day | ORAL | 1 refills | Status: AC | PRN
Start: 2024-07-18 — End: ?

## 2024-07-18 NOTE — Assessment & Plan Note (Signed)
 Use of symbicort  1puff daily instead of 2puffs BID Needs albuterol  refill.  Advised about proper use of symbicort  Albuterol  and benzonatate  refill sent

## 2024-07-18 NOTE — Assessment & Plan Note (Signed)
 Diet controlled LDL at goal with  Normal foot exam No retinopathy or neuropathy Repeat hgbA1c, CMP, and UACr

## 2024-07-18 NOTE — Progress Notes (Signed)
 Established Patient Visit  Patient: Stacy Chapman   DOB: [birth year]   68 y.o. Female  MRN: [id number] Visit Date: 07/18/2024  Subjective:    Chief Complaint  Patient presents with   Follow-up    Follow up  Wants flu vaccine    HPI Mild intermittent asthma with acute exacerbation Use of symbicort  1puff daily instead of 2puffs BID Needs albuterol  refill.  Advised about proper use of symbicort  Albuterol  and benzonatate  refill sent  Type 2 diabetes mellitus with other specified complication, without long-term current use of insulin (HCC) Diet controlled LDL at goal with  Normal foot exam No retinopathy or neuropathy Repeat hgbA1c, CMP, and UACr   Reviewed medical, surgical, and social history today  Medications: Outpatient Medications Prior to Visit  Medication Sig   acetaminophen  (TYLENOL ) 500 MG tablet Take 1,000 mg by mouth every 8 (eight) hours as needed for moderate pain.   azelastine  (ASTELIN ) 0.1 % nasal spray Place 1 spray into both nostrils 2 (two) times daily. Use in each nostril as directed   Biotin 2.5 MG TABS Take 2.5 mg by mouth daily.   budesonide -formoterol  (SYMBICORT ) 160-4.5 MCG/ACT inhaler Inhale 2 puffs into the lungs 2 (two) times daily.   ferrous gluconate (FERGON) 324 MG tablet Take 324 mg by mouth daily with breakfast.   furosemide  (LASIX ) 20 MG tablet TAKE 1 TABLET BY MOUTH DAILY AS NEEDED FOR SWELLING OR WEIGHT GAIN   losartan  (COZAAR ) 25 MG tablet TAKE 1 TABLET(25 MG) BY MOUTH DAILY   magnesium  gluconate (MAGONATE) 500 MG tablet Take 500 mg by mouth at bedtime.   metoprolol  succinate (TOPROL -XL) 25 MG 24 hr tablet TAKE 1/2 TABLET(12.5 MG) BY MOUTH DAILY   pantoprazole  (PROTONIX ) 40 MG tablet Take 1 tablet (40 mg total) by mouth daily.   rivaroxaban  (XARELTO ) 20 MG TABS tablet TAKE 1 TABLET BY MOUTH ONCE DAILY WITH SUPPER   spironolactone  (ALDACTONE ) 25 MG tablet TAKE 1 TABLET(25 MG) BY MOUTH DAILY   [DISCONTINUED] albuterol   (VENTOLIN  HFA) 108 (90 Base) MCG/ACT inhaler INHALE 1 TO 2 PUFFS INTO THE LUNGS EVERY 6 HOURS AS NEEDED FOR WHEEZING OR SHORTNESS OF BREATH   [DISCONTINUED] benzonatate  (TESSALON ) 200 MG capsule Take 1 capsule (200 mg total) by mouth daily as needed.   [DISCONTINUED] rosuvastatin  (CRESTOR ) 10 MG tablet TAKE 1 TABLET(10 MG) BY MOUTH DAILY   azelastine  (ASTELIN ) 0.1 % nasal spray Place 1 spray into both nostrils 2 (two) times daily. Use in each nostril as directed   [DISCONTINUED] tirzepatide  (MOUNJARO ) 2.5 MG/0.5ML Pen Inject 2.5 mg into the skin once a week. (Patient not taking: Reported on 07/18/2024)   No facility-administered medications prior to visit.   Reviewed past medical and social history.   ROS per HPI above      Objective:  BP 128/72 (BP Location: Left Arm, Patient Position: Sitting, Cuff Size: Large)   Pulse 73   Ht 5' 7 (1.702 m)   Wt 236 lb 6.4 oz (107.2 kg)   SpO2 97%   BMI 37.03 kg/m      Physical Exam Vitals and nursing note reviewed.  Cardiovascular:     Rate and Rhythm: Normal rate and regular rhythm.     Pulses: Normal pulses.     Heart sounds: Normal heart sounds.  Pulmonary:     Effort: Pulmonary effort is normal.     Breath sounds: Normal breath sounds.  Musculoskeletal:  Right lower leg: No edema.     Left lower leg: No edema.  Neurological:     Mental Status: She is alert and oriented to person, place, and time.     No results found for any visits on 07/18/24.    Assessment & Plan:    Problem List Items Addressed This Visit     Chronic cough   Relevant Medications   benzonatate  (TESSALON ) 200 MG capsule   Hyperlipidemia associated with type 2 diabetes mellitus (HCC)   Relevant Medications   rosuvastatin  (CRESTOR ) 10 MG tablet   Mild intermittent asthma with acute exacerbation   Use of symbicort  1puff daily instead of 2puffs BID Needs albuterol  refill.  Advised about proper use of symbicort  Albuterol  and benzonatate  refill sent       Relevant Medications   albuterol  (VENTOLIN  HFA) 108 (90 Base) MCG/ACT inhaler   Type 2 diabetes mellitus with other specified complication, without long-term current use of insulin (HCC) - Primary   Diet controlled LDL at goal with  Normal foot exam No retinopathy or neuropathy Repeat hgbA1c, CMP, and UACr      Relevant Medications   rosuvastatin  (CRESTOR ) 10 MG tablet   Other Relevant Orders   Hemoglobin A1c   Microalbumin / creatinine urine ratio   Comprehensive metabolic panel with GFR   Other Visit Diagnoses       Need for influenza vaccination       Relevant Orders   Flu vaccine HIGH DOSE PF(Fluzone Trivalent) (Completed)     Asymptomatic postmenopausal estrogen deficiency       Relevant Orders   DG Bone Density      Return in about 6 months (around 01/16/2025) for CPE (fasting).     Roselie Mood, NP

## 2024-07-18 NOTE — Patient Instructions (Signed)
 Go to lab Maintain Heart healthy diet and daily exercise. Maintain current medications.

## 2024-07-21 ENCOUNTER — Ambulatory Visit: Payer: Self-pay | Admitting: Nurse Practitioner

## 2024-07-21 DIAGNOSIS — N179 Acute kidney failure, unspecified: Secondary | ICD-10-CM

## 2024-08-08 ENCOUNTER — Other Ambulatory Visit (INDEPENDENT_AMBULATORY_CARE_PROVIDER_SITE_OTHER)

## 2024-08-08 DIAGNOSIS — N179 Acute kidney failure, unspecified: Secondary | ICD-10-CM

## 2024-08-08 LAB — RENAL FUNCTION PANEL
Albumin: 3.8 g/dL (ref 3.5–5.2)
BUN: 22 mg/dL (ref 6–23)
CO2: 23 meq/L (ref 19–32)
Calcium: 9.3 mg/dL (ref 8.4–10.5)
Chloride: 104 meq/L (ref 96–112)
Creatinine, Ser: 1.04 mg/dL (ref 0.40–1.20)
GFR: 55.46 mL/min — ABNORMAL LOW (ref >60.00)
Glucose, Bld: 95 mg/dL (ref 70–99)
Phosphorus: 2.8 mg/dL (ref 2.3–4.6)
Potassium: 5 meq/L (ref 3.5–5.1)
Sodium: 134 meq/L — ABNORMAL LOW (ref 135–145)

## 2024-08-09 ENCOUNTER — Ambulatory Visit: Payer: Self-pay | Admitting: Nurse Practitioner

## 2024-09-26 ENCOUNTER — Other Ambulatory Visit: Payer: Self-pay | Admitting: Nurse Practitioner

## 2024-09-26 DIAGNOSIS — E1169 Type 2 diabetes mellitus with other specified complication: Secondary | ICD-10-CM

## 2024-10-15 ENCOUNTER — Other Ambulatory Visit: Payer: Self-pay | Admitting: Nurse Practitioner

## 2024-10-15 DIAGNOSIS — J4521 Mild intermittent asthma with (acute) exacerbation: Secondary | ICD-10-CM

## 2024-11-08 ENCOUNTER — Telehealth: Payer: Self-pay

## 2024-11-08 NOTE — Telephone Encounter (Signed)
 Copied from CRM #[id number]. Topic: General - Other >> Nov 08, 2024 12:09 PM Viola FALCON wrote: Delon from [facility] called to let [facility], CHARLOTTE LUM know that patient is now apart of the case managemnt program and her health coach is Burnard Abt [phone]. Just an FYI

## 2024-12-15 ENCOUNTER — Ambulatory Visit: Admitting: Internal Medicine

## 2024-12-15 ENCOUNTER — Other Ambulatory Visit (HOSPITAL_COMMUNITY)

## 2025-01-17 ENCOUNTER — Encounter: Admitting: Nurse Practitioner
